# Patient Record
Sex: Male | Born: 1960 | Race: White | Hispanic: No | Marital: Married | Smoking: Never smoker
Health system: Southern US, Community
[De-identification: ages and names within clinical notes are randomized; demographics above are authoritative.]

## PROBLEM LIST (undated history)

## (undated) DIAGNOSIS — H409 Unspecified glaucoma: Secondary | ICD-10-CM

## (undated) DIAGNOSIS — M25512 Pain in left shoulder: Secondary | ICD-10-CM

## (undated) DIAGNOSIS — I10 Essential (primary) hypertension: Secondary | ICD-10-CM

## (undated) DIAGNOSIS — E785 Hyperlipidemia, unspecified: Secondary | ICD-10-CM

## (undated) DIAGNOSIS — E119 Type 2 diabetes mellitus without complications: Secondary | ICD-10-CM

## (undated) DIAGNOSIS — T8859XA Other complications of anesthesia, initial encounter: Secondary | ICD-10-CM

## (undated) HISTORY — DX: Hyperlipidemia, unspecified: E78.5

## (undated) HISTORY — DX: Unspecified glaucoma: H40.9

## (undated) HISTORY — PX: OTHER SURGICAL HISTORY: SHX169

## (undated) HISTORY — DX: Type 2 diabetes mellitus without complications: E11.9

## (undated) HISTORY — PX: SHOULDER SURGERY: SHX246

## (undated) HISTORY — DX: Essential (primary) hypertension: I10

---

## 1991-01-11 HISTORY — PX: HERNIA REPAIR: SHX51

## 2004-08-10 ENCOUNTER — Encounter: Admission: RE | Admit: 2004-08-10 | Discharge: 2004-08-10 | Payer: Self-pay | Admitting: Specialist

## 2009-01-10 HISTORY — PX: EYE SURGERY: SHX253

## 2011-07-05 ENCOUNTER — Ambulatory Visit: Payer: Self-pay | Admitting: Internal Medicine

## 2011-07-05 LAB — DOT URINE DIP
Protein: 100
Specific Gravity: 1.02 (ref 1.003–1.030)

## 2011-09-15 ENCOUNTER — Ambulatory Visit: Payer: Self-pay

## 2011-09-15 LAB — DOT URINE DIP: Specific Gravity: 1.02 (ref 1.003–1.030)

## 2011-09-26 ENCOUNTER — Ambulatory Visit: Payer: Self-pay

## 2011-12-16 ENCOUNTER — Ambulatory Visit: Payer: Self-pay | Admitting: Emergency Medicine

## 2011-12-16 LAB — DOT URINE DIP
Blood: NEGATIVE
Glucose,UR: NEGATIVE mg/dL (ref 0–75)
Specific Gravity: 1.025 (ref 1.003–1.030)

## 2012-07-04 ENCOUNTER — Ambulatory Visit: Payer: Self-pay | Admitting: Family Medicine

## 2013-12-17 ENCOUNTER — Ambulatory Visit: Payer: Self-pay | Admitting: Family Medicine

## 2013-12-17 LAB — DOT URINE DIP
BLOOD: NEGATIVE
Glucose,UR: NEGATIVE
Protein: 30
SPECIFIC GRAVITY: 1.015 (ref 1.000–1.030)

## 2018-11-26 DIAGNOSIS — H401132 Primary open-angle glaucoma, bilateral, moderate stage: Secondary | ICD-10-CM | POA: Diagnosis not present

## 2018-12-25 DIAGNOSIS — E119 Type 2 diabetes mellitus without complications: Secondary | ICD-10-CM | POA: Diagnosis not present

## 2018-12-31 ENCOUNTER — Encounter

## 2018-12-31 ENCOUNTER — Other Ambulatory Visit: Payer: Self-pay

## 2018-12-31 ENCOUNTER — Ambulatory Visit (INDEPENDENT_AMBULATORY_CARE_PROVIDER_SITE_OTHER): Payer: PPO | Admitting: Family Medicine

## 2018-12-31 ENCOUNTER — Encounter: Payer: Self-pay | Admitting: Family Medicine

## 2018-12-31 VITALS — BP 122/80 | HR 76 | Ht 77.0 in | Wt 241.0 lb

## 2018-12-31 DIAGNOSIS — E1142 Type 2 diabetes mellitus with diabetic polyneuropathy: Secondary | ICD-10-CM

## 2018-12-31 DIAGNOSIS — S43422A Sprain of left rotator cuff capsule, initial encounter: Secondary | ICD-10-CM

## 2018-12-31 DIAGNOSIS — Z7689 Persons encountering health services in other specified circumstances: Secondary | ICD-10-CM

## 2018-12-31 MED ORDER — GLIPIZIDE 5 MG PO TABS: 5.0000 mg | ORAL_TABLET | Freq: Two times a day (BID) | ORAL | 2 refills | Status: DC

## 2018-12-31 MED ORDER — MELOXICAM 15 MG PO TABS: 15.0000 mg | ORAL_TABLET | Freq: Every day | ORAL | 0 refills | Status: DC

## 2018-12-31 MED ORDER — METFORMIN HCL 500 MG PO TABS: 500.0000 mg | ORAL_TABLET | Freq: Two times a day (BID) | ORAL | 3 refills | Status: DC

## 2018-12-31 NOTE — Progress Notes (Signed)
Date:  12/31/2018   Name:  Reginald Nielsen   DOB:  13-Dec-1960   MRN:  [id number]   Chief Complaint: Establish Care, Shoulder Pain (fell off ladder over a year ago- L) shoulder feels like the Right shoulder did when he tore something), and Diabetes  Patient is a 58 year old male who presents for a establish care exam. The patient reports the following problems: diabetes/shoulder. Health maintenance has been reviewed colonoscopy.  Shoulder Pain  The pain is present in the left shoulder. This is a chronic problem. The current episode started more than 1 year ago (14 months). There has been a history of trauma. The problem occurs daily. The problem has been unchanged. The quality of the pain is described as aching. The pain is moderate. Associated symptoms include a limited range of motion. Pertinent negatives include no fever, numbness or tingling. The symptoms are aggravated by activity. He has tried NSAIDS and acetaminophen for the symptoms. The treatment provided mild relief.  Diabetes He presents for his follow-up diabetic visit. He has type 2 diabetes mellitus. His disease course has been fluctuating. There are no hypoglycemic associated symptoms. Pertinent negatives for hypoglycemia include no dizziness, headaches or nervousness/anxiousness. Associated symptoms include polydipsia, polyuria and weight loss. Pertinent negatives for diabetes include no blurred vision, no chest pain, no fatigue, no foot paresthesias, no polyphagia, no visual change and no weakness. There are no hypoglycemic complications. Symptoms are worsening. Diabetic complications include peripheral neuropathy. Pertinent negatives for diabetic complications include no autonomic neuropathy, CVA, heart disease, impotence, nephropathy, PVD or retinopathy. Current diabetic treatment includes diet. His weight is decreasing steadily. He is following a generally healthy diet. Meal planning includes avoidance of concentrated sweets and  carbohydrate counting. An ACE inhibitor/angiotensin II receptor blocker is not being taken. He does not see a podiatrist.Eye exam is current.    No results found for: CREATININE, BUN, NA, K, CL, CO2 No results found for: CHOL, HDL, LDLCALC, LDLDIRECT, TRIG, CHOLHDL No results found for: TSH No results found for: HGBA1C   Review of Systems  Constitutional: Positive for weight loss. Negative for chills, fatigue and fever.  HENT: Negative for drooling, ear discharge, ear pain and sore throat.   Eyes: Negative for blurred vision.  Respiratory: Negative for cough, shortness of breath and wheezing.   Cardiovascular: Negative for chest pain, palpitations and leg swelling.  Gastrointestinal: Negative for abdominal pain, blood in stool, constipation, diarrhea and nausea.  Endocrine: Positive for polydipsia and polyuria. Negative for polyphagia.  Genitourinary: Negative for dysuria, frequency, hematuria, impotence and urgency.  Musculoskeletal: Negative for back pain, myalgias and neck pain.  Skin: Negative for rash.  Allergic/Immunologic: Negative for environmental allergies.  Neurological: Negative for dizziness, tingling, weakness, numbness and headaches.  Hematological: Does not bruise/bleed easily.  Psychiatric/Behavioral: Negative for suicidal ideas. The patient is not nervous/anxious.     There are no problems to display for this patient.   No Known Allergies    Social History   Tobacco Use  . Smoking status: Never Smoker  . Smokeless tobacco: Current User    Types: Chew  Substance Use Topics  . Alcohol use: Not Currently  . Drug use: Not Currently     Medication list has been reviewed and updated.  Current Meds  Medication Sig  . acetaminophen (TYLENOL) 325 MG tablet Take 2 tablets by mouth every 6 (six) hours as needed.  . COMBIGAN 0.2-0.5 % ophthalmic solution Apply 1 drop to eye 2 (two) times daily.  .Marland Kitchen  Ibuprofen 200 MG CAPS Take 3 capsules by mouth daily.  Marland Kitchen  latanoprost (XALATAN) 0.005 % ophthalmic solution     PHQ 2/9 Scores 12/31/2018  PHQ - 2 Score 0  PHQ- 9 Score 0    BP Readings from Last 3 Encounters:  12/31/18 122/80    Physical Exam Vitals and nursing note reviewed.  HENT:     Head: Normocephalic.     Right Ear: Tympanic membrane, ear canal and external ear normal.     Left Ear: Tympanic membrane, ear canal and external ear normal.     Nose: Nose normal. No congestion or rhinorrhea.     Mouth/Throat:     Mouth: Mucous membranes are moist.  Eyes:     General: No scleral icterus.       Right eye: No discharge.        Left eye: No discharge.     Conjunctiva/sclera: Conjunctivae normal.     Pupils: Pupils are equal, round, and reactive to light.  Neck:     Thyroid: No thyromegaly.     Vascular: No JVD.     Trachea: No tracheal deviation.  Cardiovascular:     Rate and Rhythm: Normal rate and regular rhythm.     Heart sounds: Normal heart sounds, S1 normal and S2 normal. No murmur. No systolic murmur. No diastolic murmur. No friction rub. No gallop. No S3 or S4 sounds.   Pulmonary:     Effort: No respiratory distress.     Breath sounds: Normal breath sounds. No wheezing, rhonchi or rales.  Abdominal:     General: Bowel sounds are normal.     Palpations: Abdomen is soft. There is no mass.     Tenderness: There is no abdominal tenderness. There is no guarding or rebound.  Musculoskeletal:        General: No tenderness.     Left shoulder: No bony tenderness. Decreased range of motion. Normal pulse.     Cervical back: Normal range of motion and neck supple.     Right lower leg: No edema.     Left lower leg: No edema.  Lymphadenopathy:     Cervical: No cervical adenopathy.  Skin:    General: Skin is warm.     Findings: No rash.  Neurological:     Mental Status: He is alert and oriented to person, place, and time.     Cranial Nerves: No cranial nerve deficit.     Deep Tendon Reflexes: Reflexes are normal and symmetric.       Wt Readings from Last 3 Encounters:  12/31/18 241 lb (109.3 kg)    BP 122/80   Pulse 76   Ht 6\' 5"  (1.956 m)   Wt 241 lb (109.3 kg)   BMI 28.58 kg/m   Assessment and Plan:   1. Establishing care with new doctor, encounter for Patient establishes care with new physician.  Patient's previous encounters/elsewhere reviewed.  There are no labs to review nor imaging to review.  We will recheck patient in approximately 6 weeks with physical exam.  2. Type 2 diabetes mellitus with diabetic polyneuropathy, without long-term current use of insulin (HCC) Chronic.  Uncontrolled.  Point-of-care glucose 273 today.  We will resume patient's glipizide at 5 mg twice a day and Metformin 500 twice a day.  Will recheck patient in 6 weeks at which time we will do A1c lipid panel and microalbuminuria.  Patient was also given diet on monitoring of carbohydrates. - glipiZIDE (GLUCOTROL) 5 MG  tablet; Take 1 tablet (5 mg total) by mouth 2 (two) times daily before a meal.  Dispense: 60 tablet; Refill: 2 - metFORMIN (GLUCOPHAGE) 500 MG tablet; Take 1 tablet (500 mg total) by mouth 2 (two) times daily with a meal.  Dispense: 60 tablet; Refill: 3  3. Sprain of left rotator cuff capsule, initial encounter Chronic.  But had an accident where he fell off a ladder about 2 years.  Patient had right shoulder operated on by Dr. Mack Guise.  This is left arm shoulder that is involved at this time.  Patient is amenable to seeing Tawanna Cooler Monday since Dr. Mack Guise is not available and we will refer to him for evaluation and treatment.  In the meantime we will initiate meloxicam 15 mg once a day. - meloxicam (MOBIC) 15 MG tablet; Take 1 tablet (15 mg total) by mouth daily.  Dispense: 30 tablet; Refill: 0 - Ambulatory referral to Orthopedic Surgery

## 2018-12-31 NOTE — Patient Instructions (Signed)
Carbohydrate Counting for Diabetes Mellitus, Adult  Carbohydrate counting is a method of keeping track of how many carbohydrates you eat. Eating carbohydrates naturally increases the amount of sugar (glucose) in the blood. Counting how many carbohydrates you eat helps keep your blood glucose within normal limits, which helps you manage your diabetes (diabetes mellitus). It is important to know how many carbohydrates you can safely have in each meal. This is different for every person. A diet and nutrition specialist (registered dietitian) can help you make a meal plan and calculate how many carbohydrates you should have at each meal and snack. Carbohydrates are found in the following foods:  Grains, such as breads and cereals.  Dried beans and soy products.  Starchy vegetables, such as potatoes, peas, and corn.  Fruit and fruit juices.  Milk and yogurt.  Sweets and snack foods, such as cake, cookies, candy, chips, and soft drinks. How do I count carbohydrates? There are two ways to count carbohydrates in food. You can use either of the methods or a combination of both. Reading "Nutrition Facts" on packaged food The "Nutrition Facts" list is included on the labels of almost all packaged foods and beverages in the [place] It includes:  The serving size.  Information about nutrients in each serving, including the grams (g) of carbohydrate per serving. To use the "Nutrition Facts":  Decide how many servings you will have.  Multiply the number of servings by the number of carbohydrates per serving.  The resulting number is the total amount of carbohydrates that you will be having. Learning standard serving sizes of other foods When you eat carbohydrate foods that are not packaged or do not include "Nutrition Facts" on the label, you need to measure the servings in order to count the amount of carbohydrates:  Measure the foods that you will eat with a food scale or measuring cup, if needed.   Decide how many standard-size servings you will eat.  Multiply the number of servings by 15. Most carbohydrate-rich foods have about 15 g of carbohydrates per serving. ? For example, if you eat 8 oz (170 g) of strawberries, you will have eaten 2 servings and 30 g of carbohydrates (2 servings x 15 g = 30 g).  For foods that have more than one food mixed, such as soups and casseroles, you must count the carbohydrates in each food that is included. The following list contains standard serving sizes of common carbohydrate-rich foods. Each of these servings has about 15 g of carbohydrates:   hamburger bun or  English muffin.   oz (15 mL) syrup.   oz (14 g) jelly.  1 slice of bread.  1 six-inch tortilla.  3 oz (85 g) cooked rice or pasta.  4 oz (113 g) cooked dried beans.  4 oz (113 g) starchy vegetable, such as peas, corn, or potatoes.  4 oz (113 g) hot cereal.  4 oz (113 g) mashed potatoes or  of a large baked potato.  4 oz (113 g) canned or frozen fruit.  4 oz (120 mL) fruit juice.  4-6 crackers.  6 chicken nuggets.  6 oz (170 g) unsweetened dry cereal.  6 oz (170 g) plain fat-free yogurt or yogurt sweetened with artificial sweeteners.  8 oz (240 mL) milk.  8 oz (170 g) fresh fruit or one small piece of fruit.  24 oz (680 g) popped popcorn. Example of carbohydrate counting Sample meal  3 oz (85 g) chicken breast.  6 oz (170 g)   brown rice.  4 oz (113 g) corn.  8 oz (240 mL) milk.  8 oz (170 g) strawberries with sugar-free whipped topping. Carbohydrate calculation 1. Identify the foods that contain carbohydrates: ? Rice. ? Corn. ? Milk. ? Strawberries. 2. Calculate how many servings you have of each food: ? 2 servings rice. ? 1 serving corn. ? 1 serving milk. ? 1 serving strawberries. 3. Multiply each number of servings by 15 g: ? 2 servings rice x 15 g = 30 g. ? 1 serving corn x 15 g = 15 g. ? 1 serving milk x 15 g = 15 g. ? 1 serving  strawberries x 15 g = 15 g. 4. Add together all of the amounts to find the total grams of carbohydrates eaten: ? 30 g + 15 g + 15 g + 15 g = 75 g of carbohydrates total. Summary  Carbohydrate counting is a method of keeping track of how many carbohydrates you eat.  Eating carbohydrates naturally increases the amount of sugar (glucose) in the blood.  Counting how many carbohydrates you eat helps keep your blood glucose within normal limits, which helps you manage your diabetes.  A diet and nutrition specialist (registered dietitian) can help you make a meal plan and calculate how many carbohydrates you should have at each meal and snack. This information is not intended to replace advice given to you by your health care provider. Make sure you discuss any questions you have with your health care provider. Document Released: 12/27/2004 Document Revised: 07/21/2016 Document Reviewed: 06/10/2015 Elsevier Patient Education  2020 [employer]  

## 2019-01-29 ENCOUNTER — Other Ambulatory Visit: Payer: Self-pay | Admitting: Family Medicine

## 2019-01-29 DIAGNOSIS — S43422A Sprain of left rotator cuff capsule, initial encounter: Secondary | ICD-10-CM

## 2019-02-13 ENCOUNTER — Other Ambulatory Visit: Payer: Self-pay

## 2019-02-13 ENCOUNTER — Encounter: Payer: Self-pay | Admitting: Family Medicine

## 2019-02-13 ENCOUNTER — Ambulatory Visit (INDEPENDENT_AMBULATORY_CARE_PROVIDER_SITE_OTHER): Payer: PPO | Admitting: Family Medicine

## 2019-02-13 VITALS — BP 130/80 | HR 68 | Ht 77.0 in | Wt 239.0 lb

## 2019-02-13 DIAGNOSIS — Z0000 Encounter for general adult medical examination without abnormal findings: Secondary | ICD-10-CM

## 2019-02-13 DIAGNOSIS — R234 Changes in skin texture: Secondary | ICD-10-CM

## 2019-02-13 DIAGNOSIS — R194 Change in bowel habit: Secondary | ICD-10-CM

## 2019-02-13 DIAGNOSIS — R351 Nocturia: Secondary | ICD-10-CM

## 2019-02-13 DIAGNOSIS — Z23 Encounter for immunization: Secondary | ICD-10-CM | POA: Diagnosis not present

## 2019-02-13 DIAGNOSIS — E1142 Type 2 diabetes mellitus with diabetic polyneuropathy: Secondary | ICD-10-CM

## 2019-02-13 NOTE — Progress Notes (Signed)
Date:  02/13/2019   Name:  Reginald Nielsen   DOB:  13-Jun-1960   MRN:  [id number]   Chief Complaint: Annual Exam, Diabetes, and Flu Vaccine  Patient is a 59 year old male who presents for a comprehensive physical exam. The patient reports the following problems: diabetes. Health maintenance has been reviewed colonoscopy  Diabetes He presents for his follow-up diabetic visit. He has type 2 diabetes mellitus. His disease course has been stable. There are no hypoglycemic associated symptoms. Pertinent negatives for hypoglycemia include no dizziness, headaches or nervousness/anxiousness. There are no diabetic associated symptoms. Pertinent negatives for diabetes include no blurred vision, no chest pain, no fatigue, no foot paresthesias, no foot ulcerations, no polydipsia, no polyphagia, no polyuria, no visual change, no weakness and no weight loss. There are no hypoglycemic complications. Symptoms are stable. There are no known risk factors for coronary artery disease. Current diabetic treatment includes oral agent (dual therapy). He is compliant with treatment most of the time. His weight is stable. He is following a generally healthy diet. Meal planning includes avoidance of concentrated sweets and carbohydrate counting. He participates in exercise daily. His home blood glucose trend is fluctuating minimally. His breakfast blood glucose is taken between 8-9 am. His breakfast blood glucose range is generally 110-130 mg/dl. An ACE inhibitor/angiotensin II receptor blocker is not being taken. He does not see a podiatrist.Eye exam is not current.    No results found for: CREATININE, BUN, NA, K, CL, CO2 No results found for: CHOL, HDL, LDLCALC, LDLDIRECT, TRIG, CHOLHDL No results found for: TSH No results found for: HGBA1C   Review of Systems  Constitutional: Negative for chills, fatigue, fever and weight loss.  HENT: Negative for drooling, ear discharge, ear pain and sore throat.   Eyes: Negative  for blurred vision.  Respiratory: Negative for cough, shortness of breath and wheezing.   Cardiovascular: Negative for chest pain, palpitations and leg swelling.  Gastrointestinal: Negative for abdominal pain, blood in stool, constipation, diarrhea and nausea.  Endocrine: Negative for polydipsia, polyphagia and polyuria.  Genitourinary: Positive for difficulty urinating and urgency. Negative for dysuria, frequency and hematuria.       Nocturia/hesitancy  Musculoskeletal: Negative for back pain, myalgias and neck pain.  Skin: Negative for rash.  Allergic/Immunologic: Negative for environmental allergies.  Neurological: Negative for dizziness, weakness and headaches.  Hematological: Does not bruise/bleed easily.  Psychiatric/Behavioral: Negative for suicidal ideas. The patient is not nervous/anxious.     There are no problems to display for this patient.   No Known Allergies  Past Surgical History:  Procedure Laterality Date  . frozen shoulder    . SHOULDER SURGERY     2015 and 2016    Social History   Tobacco Use  . Smoking status: Never Smoker  . Smokeless tobacco: Current User    Types: Chew  Substance Use Topics  . Alcohol use: Not Currently  . Drug use: Not Currently     Medication list has been reviewed and updated.  Current Meds  Medication Sig  . aspirin EC 81 MG tablet Take 81 mg by mouth daily.  . COMBIGAN 0.2-0.5 % ophthalmic solution Apply 1 drop to eye 2 (two) times daily.  Marland Kitchen glipiZIDE (GLUCOTROL) 5 MG tablet Take 1 tablet (5 mg total) by mouth 2 (two) times daily before a meal.  . latanoprost (XALATAN) 0.005 % ophthalmic solution   . meloxicam (MOBIC) 15 MG tablet TAKE ONE TABLET BY MOUTH ONCE DAILY   .  metFORMIN (GLUCOPHAGE) 500 MG tablet Take 1 tablet (500 mg total) by mouth 2 (two) times daily with a meal.  . [DISCONTINUED] acetaminophen (TYLENOL) 325 MG tablet Take 2 tablets by mouth every 6 (six) hours as needed.    PHQ 2/9 Scores 02/13/2019  12/31/2018  PHQ - 2 Score 0 0  PHQ- 9 Score 0 0    BP Readings from Last 3 Encounters:  02/13/19 130/80  12/31/18 122/80    Physical Exam Vitals and nursing note reviewed.  Constitutional:      Appearance: Normal appearance. He is well-groomed and overweight.  HENT:     Head: Normocephalic.     Jaw: There is normal jaw occlusion.     Right Ear: Hearing, tympanic membrane, ear canal and external ear normal.     Left Ear: Hearing, tympanic membrane, ear canal and external ear normal.     Nose: Nose normal.     Mouth/Throat:     Lips: Pink.     Mouth: Mucous membranes are moist. Mucous membranes are pale.     Tongue: No lesions.     Palate: No mass.     Pharynx: Oropharynx is clear. Uvula midline.  Eyes:     General: Lids are normal. Vision grossly intact. Gaze aligned appropriately. No scleral icterus.       Right eye: No discharge.        Left eye: No discharge.     Extraocular Movements: Extraocular movements intact.     Conjunctiva/sclera: Conjunctivae normal.     Pupils: Pupils are equal, round, and reactive to light.     Funduscopic exam:    Right eye: Red reflex present.        Left eye: Red reflex present. Neck:     Thyroid: No thyromegaly.     Vascular: Normal carotid pulses. No carotid bruit, hepatojugular reflux or JVD.     Trachea: Trachea and phonation normal. No tracheal deviation.  Cardiovascular:     Rate and Rhythm: Normal rate and regular rhythm.     Chest Wall: PMI is not displaced.     Pulses: Normal pulses.          Carotid pulses are 2+ on the right side and 2+ on the left side.      Radial pulses are 2+ on the right side and 2+ on the left side.       Femoral pulses are 2+ on the right side and 2+ on the left side.      Popliteal pulses are 2+ on the right side and 2+ on the left side.       Dorsalis pedis pulses are 2+ on the right side and 2+ on the left side.       Posterior tibial pulses are 2+ on the right side and 2+ on the left side.      Heart sounds: Normal heart sounds, S1 normal and S2 normal. No murmur. No systolic murmur. No diastolic murmur. No friction rub. No gallop. No S3 or S4 sounds.   Pulmonary:     Effort: No respiratory distress.     Breath sounds: Normal breath sounds. No decreased breath sounds, wheezing, rhonchi or rales.  Chest:     Breasts: Breasts are symmetrical.        Right: Normal.        Left: Normal.  Abdominal:     General: Bowel sounds are normal.     Palpations: Abdomen is soft. There is no hepatomegaly,  splenomegaly or mass.     Tenderness: There is no abdominal tenderness. There is no right CVA tenderness, left CVA tenderness, guarding or rebound.     Hernia: A hernia is present. Hernia is present in the ventral area. There is no hernia in the left inguinal area or right inguinal area.  Genitourinary:    Penis: Normal.      Testes: Normal.        Right: Mass not present.        Left: Mass not present.     Epididymis:     Right: Normal.     Left: Normal.     Prostate: Normal.     Rectum: Normal. Guaiac result negative. No mass.  Musculoskeletal:        General: No tenderness. Normal range of motion.     Cervical back: Normal, full passive range of motion without pain, normal range of motion and neck supple.     Thoracic back: Normal.     Lumbar back: Normal.     Right lower leg: No edema.     Left lower leg: No edema.  Feet:     Right foot:     Skin integrity: Callus present.     Left foot:     Skin integrity: Callus and fissure present.  Lymphadenopathy:     Head:     Right side of head: No submandibular adenopathy.     Left side of head: No submandibular adenopathy.     Cervical: No cervical adenopathy.     Right cervical: No superficial, deep or posterior cervical adenopathy.    Left cervical: No superficial, deep or posterior cervical adenopathy.     Upper Body:     Right upper body: No supraclavicular adenopathy.     Left upper body: No supraclavicular adenopathy.      Lower Body: No right inguinal adenopathy. No left inguinal adenopathy.  Skin:    General: Skin is warm.     Capillary Refill: Capillary refill takes less than 2 seconds.     Findings: No rash.  Neurological:     Mental Status: He is alert and oriented to person, place, and time.     Cranial Nerves: Cranial nerves are intact. No cranial nerve deficit.     Sensory: Sensation is intact. No sensory deficit.     Motor: Motor function is intact.     Deep Tendon Reflexes: Reflexes are normal and symmetric.     Reflex Scores:      Tricep reflexes are 2+ on the right side and 2+ on the left side.      Bicep reflexes are 2+ on the right side and 2+ on the left side.      Brachioradialis reflexes are 2+ on the right side and 2+ on the left side.      Patellar reflexes are 2+ on the right side and 2+ on the left side.      Achilles reflexes are 2+ on the right side and 2+ on the left side.    Wt Readings from Last 3 Encounters:  02/13/19 239 lb (108.4 kg)  12/31/18 241 lb (109.3 kg)    BP 130/80   Pulse 68   Ht 6\' 5"  (1.956 m)   Wt 239 lb (108.4 kg)   BMI 28.34 kg/m   Assessment and Plan:  1. Annual physical exam No subjective/objective concerns noted during history and physical exam.  Patient's previous encounters were reviewed as well as most  recent labs, imaging, and care elsewhere.Reginald Nielsen is a 59 y.o. male who presents today for his Complete Annual Exam. He feels well. He reports exercising . He reports he is sleeping well.  Immunizations are reviewed and recommendations provided.   Age appropriate screening tests are discussed. Counseling given for risk factor reduction interventions. 2. Type 2 diabetes mellitus with diabetic polyneuropathy, without long-term current use of insulin (HCC) Chronic.  Controlled.  Stable.  Medications were reviewed.  We will obtain an A1c and microalbuminuria as well as a lipid panel and renal function panel for GFR. - HgB A1c - Microalbumin,  urine - Lipid Panel With LDL/HDL Ratio - Renal Function Panel  3. Nocturia Patient with a history of nocturia and occasional hesitancy.  Will obtain a PSA for evaluation of prostate.  DRE was performed today and it was upper limits of normal in size with normal consistency and no nodularity. - PSA  4. Change in bowel habit Patient with a recent change in bowel habits.  Will refer to GI for evaluation as well as possible colonoscopy for colon cancer screening. - Ambulatory referral to Gastroenterology  5. Fissure in skin of foot Patient was noted to have a fissure in the skin of the left foot.  With history of diabetes patient is referred to podiatry for evaluation and treatment of this area and for consideration of proper nail care in the future. - Ambulatory referral to Podiatry  6. Influenza vaccine needed Discussed and administered - Flu Vaccine QUAD 6+ mos PF IM (Fluarix Quad PF)

## 2019-02-14 LAB — RENAL FUNCTION PANEL
Albumin: 4.5 g/dL (ref 3.8–4.9)
BUN/Creatinine Ratio: 12 (ref 9–20)
BUN: 15 mg/dL (ref 6–24)
CO2: 23 mmol/L (ref 20–29)
Calcium: 9.9 mg/dL (ref 8.7–10.2)
Chloride: 102 mmol/L (ref 96–106)
Creatinine, Ser: 1.23 mg/dL (ref 0.76–1.27)
GFR calc Af Amer: 75 mL/min/{1.73_m2} (ref >59)
GFR calc non Af Amer: 65 mL/min/{1.73_m2} (ref >59)
Glucose: 125 mg/dL — ABNORMAL HIGH (ref 65–99)
Phosphorus: 3.8 mg/dL (ref 2.8–4.1)
Potassium: 5.2 mmol/L (ref 3.5–5.2)
Sodium: 140 mmol/L (ref 134–144)

## 2019-02-14 LAB — LIPID PANEL WITH LDL/HDL RATIO
Cholesterol, Total: 194 mg/dL (ref 100–199)
HDL: 42 mg/dL (ref >39)
LDL Chol Calc (NIH): 125 mg/dL — ABNORMAL HIGH (ref 0–99)
LDL/HDL Ratio: 3 ratio (ref 0.0–3.6)
Triglycerides: 153 mg/dL — ABNORMAL HIGH (ref 0–149)
VLDL Cholesterol Cal: 27 mg/dL (ref 5–40)

## 2019-02-14 LAB — HEMOGLOBIN A1C
Est. average glucose Bld gHb Est-mCnc: 194 mg/dL
Hgb A1c MFr Bld: 8.4 % — ABNORMAL HIGH (ref 4.8–5.6)

## 2019-02-14 LAB — PSA: Prostate Specific Ag, Serum: 2.8 ng/mL (ref 0.0–4.0)

## 2019-02-14 LAB — MICROALBUMIN, URINE: Microalbumin, Urine: 106.7 ug/mL

## 2019-02-18 ENCOUNTER — Other Ambulatory Visit: Payer: Self-pay

## 2019-02-18 DIAGNOSIS — E1142 Type 2 diabetes mellitus with diabetic polyneuropathy: Secondary | ICD-10-CM

## 2019-02-18 MED ORDER — METFORMIN HCL 500 MG PO TABS: 500.0000 mg | ORAL_TABLET | Freq: Two times a day (BID) | ORAL | 0 refills | Status: DC

## 2019-02-18 MED ORDER — GLIPIZIDE 5 MG PO TABS: 5.0000 mg | ORAL_TABLET | Freq: Two times a day (BID) | ORAL | 0 refills | Status: DC

## 2019-02-18 NOTE — Progress Notes (Unsigned)
Called with pt wants to stay on glipizide 5mg  bid

## 2019-02-27 ENCOUNTER — Other Ambulatory Visit: Payer: Self-pay | Admitting: Family Medicine

## 2019-02-27 DIAGNOSIS — S43422A Sprain of left rotator cuff capsule, initial encounter: Secondary | ICD-10-CM

## 2019-03-08 ENCOUNTER — Other Ambulatory Visit: Payer: Self-pay | Admitting: Family Medicine

## 2019-03-25 ENCOUNTER — Other Ambulatory Visit: Payer: Self-pay

## 2019-03-25 ENCOUNTER — Encounter: Payer: Self-pay | Admitting: Gastroenterology

## 2019-03-25 ENCOUNTER — Ambulatory Visit (INDEPENDENT_AMBULATORY_CARE_PROVIDER_SITE_OTHER): Payer: PPO | Admitting: Gastroenterology

## 2019-03-25 ENCOUNTER — Encounter (INDEPENDENT_AMBULATORY_CARE_PROVIDER_SITE_OTHER): Payer: Self-pay

## 2019-03-25 VITALS — BP 142/91 | HR 74 | Temp 97.8°F | Ht 77.0 in | Wt 234.2 lb

## 2019-03-25 DIAGNOSIS — R194 Change in bowel habit: Secondary | ICD-10-CM

## 2019-03-25 DIAGNOSIS — Z1211 Encounter for screening for malignant neoplasm of colon: Secondary | ICD-10-CM

## 2019-03-25 NOTE — Progress Notes (Signed)
Gastroenterology Consultation  Referring Provider:     Duanne Limerick, MD Primary Care Physician:  Duanne Limerick, MD Primary Gastroenterologist:  Dr. Servando Snare     Reason for Consultation:     Change in bowel habits        HPI:   Reginald Nielsen is a 59 y.o. y/o male referred for consultation & management of change in bowel habits by Dr. Yetta Barre, Vanita Panda, MD.  This patient was seen by his PCP, Dr. Yetta Barre, for evaluation and the patient had reported that he was having a change in bowel habits.  The patient does not appear to have had a colonoscopy in the past. The patient says he was constipated after starting some meds for diabetes. He started taking Miralax and he did not notice any difference. He stopped it and the bowels are back to your baseline. He thinks it may also be his diet. The patient denies any unexplained weight loss fevers chills nausea vomiting black stools or bloody stools.  He also denies any family history of colon cancer or colon polyps.  Past Medical History:  Diagnosis Date  . Diabetes mellitus without complication (HCC)    type 2  . Glaucoma   . Hyperlipidemia   . Hypertension     Past Surgical History:  Procedure Laterality Date  . frozen shoulder    . SHOULDER SURGERY     2015 and 2016    Prior to Admission medications   Medication Sig Start Date End Date Taking? Authorizing Provider  aspirin EC 81 MG tablet Take 81 mg by mouth daily.    [provider]  COMBIGAN 0.2-0.5 % ophthalmic solution Apply 1 drop to eye 2 (two) times daily. 11/26/18   [provider]  glipiZIDE (GLUCOTROL) 5 MG tablet Take 1 tablet (5 mg total) by mouth 2 (two) times daily before a meal. 02/18/19   Duanne Limerick, MD  latanoprost (XALATAN) 0.005 % ophthalmic solution  12/26/18   [provider]  meloxicam (MOBIC) 15 MG tablet TAKE ONE TABLET BY MOUTH ONCE DAILY, NEEDS FOLLOW UP APPOINTMENT FOR FURTHER REFILLS. 02/27/19   Duanne Limerick, MD  metFORMIN  (GLUCOPHAGE) 500 MG tablet Take 1 tablet (500 mg total) by mouth 2 (two) times daily with a meal. 02/18/19   Duanne Limerick, MD  OneTouch Delica Lancets 30G MISC USE DAILY 03/08/19   Duanne Limerick, MD  [facility] ULTRA test strip USE TO TEST BLOOD SUGAR ONCE DAILY 03/08/19   Duanne Limerick, MD    No family history on file.   Social History   Tobacco Use  . Smoking status: Never Smoker  . Smokeless tobacco: Current User    Types: Chew  Substance Use Topics  . Alcohol use: Not Currently  . Drug use: Not Currently    Allergies as of 03/25/2019  . (No Known Allergies)    Review of Systems:    All systems reviewed and negative except where noted in HPI.   Physical Exam:  There were no vitals taken for this visit. No LMP for male patient. General:   Alert,  Well-developed, well-nourished, pleasant and cooperative in NAD Head:  Normocephalic and atraumatic. Eyes:  Sclera clear, no icterus.   Conjunctiva pink. Ears:  Normal auditory acuity. Neck:  Supple; no masses or thyromegaly. Lungs:  Respirations even and unlabored.  Clear throughout to auscultation.   No wheezes, crackles, or rhonchi. No acute distress. Heart:  Regular rate and rhythm;  no murmurs, clicks, rubs, or gallops. Abdomen:  Normal bowel sounds.  No bruits.  Soft, non-tender and non-distended without masses, hepatosplenomegaly or hernias noted.  No guarding or rebound tenderness.  Negative Carnett sign.   Rectal:  Deferred.  Pulses:  Normal pulses noted. Extremities:  No clubbing or edema.  No cyanosis. Neurologic:  Alert and oriented x3;  grossly normal neurologically. Skin:  Intact without significant lesions or rashes.  No jaundice. Lymph Nodes:  No significant cervical adenopathy. Psych:  Alert and cooperative. Normal mood and affect.  Imaging Studies: No results found.  Assessment and Plan:   Reginald Nielsen is a 58 y.o. y/o male who comes in with a change in bowel habits that have now turned back to  normal.  He attributes the change in bowel habits to his diet change and weight loss and also to the change in his medications.  The patient is now back to his baseline.  The patient will be set up for screening colonoscopy since he has never had a colonoscopy in the past.  The patient has been explained the plan and agrees with it.    Odilia Damico, MD. FACG    Note: This dictation was prepared with Dragon dictation along with smaller phrase technology. Any transcriptional errors that result from this process are unintentional.   

## 2019-03-25 NOTE — H&P (View-Only) (Signed)
Gastroenterology Consultation  Referring Provider:     Duanne Limerick, MD Primary Care Physician:  Reginald Limerick, MD Primary Gastroenterologist:  Dr. Servando Snare     Reason for Consultation:     Change in bowel habits        HPI:   Reginald Nielsen is a 59 y.o. y/o male referred for consultation & management of change in bowel habits by Dr. Yetta Barre, Vanita Panda, MD.  This patient was seen by his PCP, Dr. Yetta Barre, for evaluation and the patient had reported that he was having a change in bowel habits.  The patient does not appear to have had a colonoscopy in the past. The patient says he was constipated after starting some meds for diabetes. He started taking Miralax and he did not notice any difference. He stopped it and the bowels are back to your baseline. He thinks it may also be his diet. The patient denies any unexplained weight loss fevers chills nausea vomiting black stools or bloody stools.  He also denies any family history of colon cancer or colon polyps.  Past Medical History:  Diagnosis Date  . Diabetes mellitus without complication (HCC)    type 2  . Glaucoma   . Hyperlipidemia   . Hypertension     Past Surgical History:  Procedure Laterality Date  . frozen shoulder    . SHOULDER SURGERY     2015 and 2016    Prior to Admission medications   Medication Sig Start Date End Date Taking? Authorizing Provider  aspirin EC 81 MG tablet Take 81 mg by mouth daily.    [provider]  COMBIGAN 0.2-0.5 % ophthalmic solution Apply 1 drop to eye 2 (two) times daily. 11/26/18   [provider]  glipiZIDE (GLUCOTROL) 5 MG tablet Take 1 tablet (5 mg total) by mouth 2 (two) times daily before a meal. 02/18/19   Reginald Limerick, MD  latanoprost (XALATAN) 0.005 % ophthalmic solution  12/26/18   [provider]  meloxicam (MOBIC) 15 MG tablet TAKE ONE TABLET BY MOUTH ONCE DAILY, NEEDS FOLLOW UP APPOINTMENT FOR FURTHER REFILLS. 02/27/19   Reginald Limerick, MD  metFORMIN  (GLUCOPHAGE) 500 MG tablet Take 1 tablet (500 mg total) by mouth 2 (two) times daily with a meal. 02/18/19   Reginald Limerick, MD  OneTouch Delica Lancets 30G MISC USE DAILY 03/08/19   Reginald Limerick, MD  [facility] ULTRA test strip USE TO TEST BLOOD SUGAR ONCE DAILY 03/08/19   Reginald Limerick, MD    No family history on file.   Social History   Tobacco Use  . Smoking status: Never Smoker  . Smokeless tobacco: Current User    Types: Chew  Substance Use Topics  . Alcohol use: Not Currently  . Drug use: Not Currently    Allergies as of 03/25/2019  . (No Known Allergies)    Review of Systems:    All systems reviewed and negative except where noted in HPI.   Physical Exam:  There were no vitals taken for this visit. No LMP for male patient. General:   Alert,  Well-developed, well-nourished, pleasant and cooperative in NAD Head:  Normocephalic and atraumatic. Eyes:  Sclera clear, no icterus.   Conjunctiva pink. Ears:  Normal auditory acuity. Neck:  Supple; no masses or thyromegaly. Lungs:  Respirations even and unlabored.  Clear throughout to auscultation.   No wheezes, crackles, or rhonchi. No acute distress. Heart:  Regular rate and rhythm;  no murmurs, clicks, rubs, or gallops. Abdomen:  Normal bowel sounds.  No bruits.  Soft, non-tender and non-distended without masses, hepatosplenomegaly or hernias noted.  No guarding or rebound tenderness.  Negative Carnett sign.   Rectal:  Deferred.  Pulses:  Normal pulses noted. Extremities:  No clubbing or edema.  No cyanosis. Neurologic:  Alert and oriented x3;  grossly normal neurologically. Skin:  Intact without significant lesions or rashes.  No jaundice. Lymph Nodes:  No significant cervical adenopathy. Psych:  Alert and cooperative. Normal mood and affect.  Imaging Studies: No results found.  Assessment and Plan:   Reginald Nielsen is a 59 y.o. y/o male who comes in with a change in bowel habits that have now turned back to  normal.  He attributes the change in bowel habits to his diet change and weight loss and also to the change in his medications.  The patient is now back to his baseline.  The patient will be set up for screening colonoscopy since he has never had a colonoscopy in the past.  The patient has been explained the plan and agrees with it.    Reginald Lame, MD. Reginald Nielsen    Note: This dictation was prepared with Dragon dictation along with smaller phrase technology. Any transcriptional errors that result from this process are unintentional.

## 2019-03-27 ENCOUNTER — Other Ambulatory Visit: Payer: Self-pay | Admitting: Family Medicine

## 2019-03-27 DIAGNOSIS — S43422A Sprain of left rotator cuff capsule, initial encounter: Secondary | ICD-10-CM

## 2019-04-01 ENCOUNTER — Other Ambulatory Visit: Payer: Self-pay

## 2019-04-01 ENCOUNTER — Encounter: Payer: Self-pay | Admitting: Gastroenterology

## 2019-04-01 MED ORDER — SUTAB 1479-225-188 MG PO TABS: 1.0000 | ORAL_TABLET | ORAL | 0 refills | Status: DC

## 2019-04-04 ENCOUNTER — Other Ambulatory Visit
Admission: RE | Admit: 2019-04-04 | Discharge: 2019-04-04 | Disposition: A | Discharge status: Home / Self Care | Payer: PPO | Source: Ambulatory Visit | Attending: Gastroenterology | Admitting: Gastroenterology

## 2019-04-04 DIAGNOSIS — Z01812 Encounter for preprocedural laboratory examination: Secondary | ICD-10-CM | POA: Insufficient documentation

## 2019-04-04 DIAGNOSIS — Z20822 Contact with and (suspected) exposure to covid-19: Secondary | ICD-10-CM | POA: Diagnosis not present

## 2019-04-04 LAB — SARS CORONAVIRUS 2 (TAT 6-24 HRS): SARS Coronavirus 2: NEGATIVE

## 2019-04-04 NOTE — Discharge Instructions (Signed)
General Anesthesia, Adult, Care After This sheet gives you information about how to care for yourself after your procedure. Your health care provider may also give you more specific instructions. If you have problems or questions, contact your health care provider. What can I expect after the procedure? After the procedure, the following side effects are common:  Pain or discomfort at the IV site.  Nausea.  Vomiting.  Sore throat.  Trouble concentrating.  Feeling cold or chills.  Weak or tired.  Sleepiness and fatigue.  Soreness and body aches. These side effects can affect parts of the body that were not involved in surgery. Follow these instructions at home:  For at least 24 hours after the procedure:  Have a responsible adult stay with you. It is important to have someone help care for you until you are awake and alert.  Rest as needed.  Do not: ? Participate in activities in which you could fall or become injured. ? Drive. ? Use heavy machinery. ? Drink alcohol. ? Take sleeping pills or medicines that cause drowsiness. ? Make important decisions or sign legal documents. ? Take care of children on your own. Eating and drinking  Follow any instructions from your health care provider about eating or drinking restrictions.  When you feel hungry, start by eating small amounts of foods that are soft and easy to digest (bland), such as toast. Gradually return to your regular diet.  Drink enough fluid to keep your urine pale yellow.  If you vomit, rehydrate by drinking water, juice, or clear broth. General instructions  If you have sleep apnea, surgery and certain medicines can increase your risk for breathing problems. Follow instructions from your health care provider about wearing your sleep device: ? Anytime you are sleeping, including during daytime naps. ? While taking prescription pain medicines, sleeping medicines, or medicines that make you drowsy.  Return to  your normal activities as told by your health care provider. Ask your health care provider what activities are safe for you.  Take over-the-counter and prescription medicines only as told by your health care provider.  If you smoke, do not smoke without supervision.  Keep all follow-up visits as told by your health care provider. This is important. Contact a health care provider if:  You have nausea or vomiting that does not get better with medicine.  You cannot eat or drink without vomiting.  You have pain that does not get better with medicine.  You are unable to pass urine.  You develop a skin rash.  You have a fever.  You have redness around your IV site that gets worse. Get help right away if:  You have difficulty breathing.  You have chest pain.  You have blood in your urine or stool, or you vomit blood. Summary  After the procedure, it is common to have a sore throat or nausea. It is also common to feel tired.  Have a responsible adult stay with you for the first 24 hours after general anesthesia. It is important to have someone help care for you until you are awake and alert.  When you feel hungry, start by eating small amounts of foods that are soft and easy to digest (bland), such as toast. Gradually return to your regular diet.  Drink enough fluid to keep your urine pale yellow.  Return to your normal activities as told by your health care provider. Ask your health care provider what activities are safe for you. This information is not   intended to replace advice given to you by your health care provider. Make sure you discuss any questions you have with your health care provider. Document Revised: 12/30/2016 Document Reviewed: 08/12/2016 Elsevier Patient Education  2020 [employer].  

## 2019-04-08 ENCOUNTER — Ambulatory Visit
Admission: RE | Admit: 2019-04-08 | Discharge: 2019-04-08 | Disposition: A | Discharge status: Home / Self Care | Payer: PPO | Attending: Gastroenterology | Admitting: Gastroenterology

## 2019-04-08 ENCOUNTER — Other Ambulatory Visit: Payer: Self-pay

## 2019-04-08 ENCOUNTER — Encounter: Payer: Self-pay | Admitting: Gastroenterology

## 2019-04-08 ENCOUNTER — Encounter
Admission: RE | Disposition: A | Discharge status: Home / Self Care | Payer: Self-pay | Source: Home / Self Care | Attending: Gastroenterology

## 2019-04-08 ENCOUNTER — Ambulatory Visit: Payer: PPO | Admitting: Anesthesiology

## 2019-04-08 DIAGNOSIS — Z7982 Long term (current) use of aspirin: Secondary | ICD-10-CM | POA: Insufficient documentation

## 2019-04-08 DIAGNOSIS — H409 Unspecified glaucoma: Secondary | ICD-10-CM | POA: Diagnosis not present

## 2019-04-08 DIAGNOSIS — K5730 Diverticulosis of large intestine without perforation or abscess without bleeding: Secondary | ICD-10-CM | POA: Diagnosis not present

## 2019-04-08 DIAGNOSIS — Z1211 Encounter for screening for malignant neoplasm of colon: Secondary | ICD-10-CM | POA: Insufficient documentation

## 2019-04-08 DIAGNOSIS — E119 Type 2 diabetes mellitus without complications: Secondary | ICD-10-CM | POA: Insufficient documentation

## 2019-04-08 DIAGNOSIS — K648 Other hemorrhoids: Secondary | ICD-10-CM | POA: Diagnosis not present

## 2019-04-08 DIAGNOSIS — I10 Essential (primary) hypertension: Secondary | ICD-10-CM | POA: Diagnosis not present

## 2019-04-08 DIAGNOSIS — Z7984 Long term (current) use of oral hypoglycemic drugs: Secondary | ICD-10-CM | POA: Diagnosis not present

## 2019-04-08 DIAGNOSIS — E785 Hyperlipidemia, unspecified: Secondary | ICD-10-CM | POA: Diagnosis not present

## 2019-04-08 DIAGNOSIS — Z860100 Personal history of colon polyps, unspecified: Secondary | ICD-10-CM

## 2019-04-08 HISTORY — PX: COLONOSCOPY WITH PROPOFOL: SHX5780

## 2019-04-08 HISTORY — DX: Pain in left shoulder: M25.512

## 2019-04-08 LAB — GLUCOSE, CAPILLARY
Glucose-Capillary: 92 mg/dL (ref 70–99)
Glucose-Capillary: 102 mg/dL — ABNORMAL HIGH (ref 70–99)

## 2019-04-08 SURGERY — COLONOSCOPY WITH PROPOFOL
Anesthesia: General | Site: Rectum

## 2019-04-08 MED ORDER — PROPOFOL 10 MG/ML IV BOLUS
INTRAVENOUS | Status: DC | PRN
  Administered 2019-04-08 (×2): 100 mg via INTRAVENOUS

## 2019-04-08 MED ORDER — LIDOCAINE HCL (CARDIAC) PF 100 MG/5ML IV SOSY
PREFILLED_SYRINGE | INTRAVENOUS | Status: DC | PRN
  Administered 2019-04-08: 50 mg via INTRAVENOUS

## 2019-04-08 MED ORDER — LACTATED RINGERS IV SOLN
100.0000 mL/h | INTRAVENOUS | Status: DC
  Administered 2019-04-08: 100 mL/h via INTRAVENOUS

## 2019-04-08 MED ORDER — ONDANSETRON HCL 4 MG/2ML IJ SOLN: 4.0000 mg | Freq: Once | INTRAMUSCULAR | Status: DC | PRN

## 2019-04-08 MED ORDER — ACETAMINOPHEN 10 MG/ML IV SOLN: 1000.0000 mg | Freq: Once | INTRAVENOUS | Status: DC | PRN

## 2019-04-08 MED ORDER — STERILE WATER FOR IRRIGATION IR SOLN
Status: DC | PRN
  Administered 2019-04-08: .05 mL

## 2019-04-08 SURGICAL SUPPLY — 5 items
CANISTER SUCT 1200ML W/VALVE (MISCELLANEOUS) ×3 IMPLANT
GOWN CVR UNV OPN BCK APRN NK (MISCELLANEOUS) ×2 IMPLANT
GOWN ISOL THUMB LOOP REG UNIV (MISCELLANEOUS) ×6
KIT ENDO PROCEDURE OLY (KITS) ×3 IMPLANT
WATER STERILE IRR 250ML POUR (IV SOLUTION) ×3 IMPLANT

## 2019-04-08 NOTE — Transfer of Care (Signed)
Immediate Anesthesia Transfer of Care Note  Patient: Reginald Nielsen  Procedure(s) Performed: COLONOSCOPY WITH PROPOFOL (N/A Rectum)  Patient Location: PACU  Anesthesia Type: General  Level of Consciousness: awake, alert  and patient cooperative  Airway and Oxygen Therapy: Patient Spontanous Breathing and Patient connected to supplemental oxygen  Post-op Assessment: Post-op Vital signs reviewed, Patient's Cardiovascular Status Stable, Respiratory Function Stable, Patent Airway and No signs of Nausea or vomiting  Post-op Vital Signs: Reviewed and stable  Complications: No apparent anesthesia complications

## 2019-04-08 NOTE — Anesthesia Postprocedure Evaluation (Signed)
Anesthesia Post Note  Patient: Reginald Nielsen  Procedure(s) Performed: COLONOSCOPY WITH PROPOFOL (N/A Rectum)     Patient location during evaluation: PACU Anesthesia Type: General Level of consciousness: awake and alert Pain management: pain level controlled Vital Signs Assessment: post-procedure vital signs reviewed and stable Respiratory status: spontaneous breathing, nonlabored ventilation, respiratory function stable and patient connected to nasal cannula oxygen Cardiovascular status: blood pressure returned to baseline and stable Postop Assessment: no apparent nausea or vomiting Anesthetic complications: no    Kainon Varady A  Renaye Janicki

## 2019-04-08 NOTE — Interval H&P Note (Signed)
History and Physical Interval Note:  04/08/2019 9:21 AM  Reginald Nielsen  has presented today for surgery, with the diagnosis of Screening Z12.11.  The various methods of treatment have been discussed with the patient and family. After consideration of risks, benefits and other options for treatment, the patient has consented to  Procedure(s): COLONOSCOPY WITH PROPOFOL (N/A) as a surgical intervention.  The patient's history has been reviewed, patient examined, no change in status, stable for surgery.  I have reviewed the patient's chart and labs.  Questions were answered to the patient's satisfaction.     [name] [employer]

## 2019-04-08 NOTE — Anesthesia Preprocedure Evaluation (Addendum)
Anesthesia Evaluation  Patient identified by MRN, date of birth, ID band Patient awake    Reviewed: Allergy & Precautions, NPO status , Patient's Chart, lab work & pertinent test results  History of Anesthesia Complications Negative for: history of anesthetic complications  Airway Mallampati: II  TM Distance: >3 FB Neck ROM: Full    Dental  (+)    Pulmonary    breath sounds clear to auscultation       Cardiovascular hypertension, (-) angina(-) DOE  Rhythm:Regular Rate:Normal   HLD   Neuro/Psych    GI/Hepatic neg GERD  ,  Endo/Other  diabetes  Renal/GU      Musculoskeletal   Abdominal   Peds  Hematology   Anesthesia Other Findings   Reproductive/Obstetrics                            Anesthesia Physical Anesthesia Plan  ASA: II  Anesthesia Plan: General   Post-op Pain Management:    Induction: Intravenous  PONV Risk Score and Plan: 2 and Propofol infusion, TIVA and Treatment may vary due to age or medical condition  Airway Management Planned: Natural Airway and Nasal Cannula  Additional Equipment:   Intra-op Plan:   Post-operative Plan:   Informed Consent: I have reviewed the patients History and Physical, chart, labs and discussed the procedure including the risks, benefits and alternatives for the proposed anesthesia with the patient or authorized representative who has indicated his/her understanding and acceptance.       Plan Discussed with: CRNA and Anesthesiologist  Anesthesia Plan Comments:       Anesthesia Quick Evaluation

## 2019-04-08 NOTE — Op Note (Addendum)
[facility] Gastroenterology Patient Name: Reginald Nielsen Procedure Date: 04/08/2019 9:21 AM MRN: [id number] Account #: [id number] Date of Birth: 1960/03/08 Admit Type: Outpatient Age: 59 Room: [facility] OR ROOM 01 Gender: Male Note Status: Finalized Procedure:             Colonoscopy Indications:           Screening for colorectal malignant neoplasm Providers:             Midge Minium MD, MD Referring MD:          Duanne Limerick, MD (Referring MD) Medicines:             Propofol per Anesthesia Complications:         No immediate complications. Procedure:             Pre-Anesthesia Assessment:                        - Prior to the procedure, a History and Physical was                         performed, and patient medications and allergies were                         reviewed. The patient's tolerance of previous                         anesthesia was also reviewed. The risks and benefits                         of the procedure and the sedation options and risks                         were discussed with the patient. All questions were                         answered, and informed consent was obtained. Prior                         Anticoagulants: The patient has taken no previous                         anticoagulant or antiplatelet agents. ASA Grade                         Assessment: II - A patient with mild systemic disease.                         After reviewing the risks and benefits, the patient                         was deemed in satisfactory condition to undergo the                         procedure.                        After obtaining informed consent, the colonoscope was  passed under direct vision. Throughout the procedure,                         the patient's blood pressure, pulse, and oxygen                         saturations were monitored continuously. The was                         introduced through the anus and  advanced to the the                         cecum, identified by appendiceal orifice and ileocecal                         valve. The colonoscopy was performed without                         difficulty. The patient tolerated the procedure well.                         The quality of the bowel preparation was excellent. Findings:      The perianal and digital rectal examinations were normal.      Multiple small-mouthed diverticula were found in the sigmoid colon.      Non-bleeding internal hemorrhoids were found during retroflexion. The       hemorrhoids were Grade I (internal hemorrhoids that do not prolapse). Impression:            - Diverticulosis in the sigmoid colon.                        - Non-bleeding internal hemorrhoids.                        - No specimens collected. Recommendation:        - Discharge patient to home.                        - Resume previous diet.                        - Continue present medications.                        - Repeat colonoscopy in 10 years for surveillance. Procedure Code(s):     --- Professional ---                        [phone], Colonoscopy, flexible; diagnostic, including                         collection of specimen(s) by brushing or washing, when                         performed (separate procedure) Diagnosis Code(s):     --- Professional ---                        Z12.11, Encounter for screening for malignant neoplasm  of colon CPT copyright 2019 [employer]. All rights reserved. The codes documented in this report are preliminary and upon coder review may  be revised to meet current compliance requirements. Lucilla Lame MD, MD 04/08/2019 9:43:33 AM This report has been signed electronically. Number of Addenda: 0 Note Initiated On: 04/08/2019 9:21 AM Scope Withdrawal Time: 0 hours 9 minutes 5 seconds  Total Procedure Duration: 0 hours 11 minutes 49 seconds  Estimated Blood Loss:  Estimated  blood loss: none.      [facility]

## 2019-04-08 NOTE — Anesthesia Procedure Notes (Signed)
Procedure Name: General with mask airway Date/Time: 04/08/2019 9:32 AM Performed by: Jinny Blossom, CRNA Pre-anesthesia Checklist: Timeout performed, Patient being monitored, Suction available, Emergency Drugs available and Patient identified Patient Re-evaluated:Patient Re-evaluated prior to induction Oxygen Delivery Method: Nasal cannula

## 2019-04-09 ENCOUNTER — Encounter: Payer: Self-pay | Admitting: *Deleted

## 2019-04-23 DIAGNOSIS — H401132 Primary open-angle glaucoma, bilateral, moderate stage: Secondary | ICD-10-CM | POA: Diagnosis not present

## 2019-04-23 DIAGNOSIS — H401112 Primary open-angle glaucoma, right eye, moderate stage: Secondary | ICD-10-CM | POA: Diagnosis not present

## 2019-05-06 ENCOUNTER — Ambulatory Visit: Payer: PPO

## 2019-05-06 ENCOUNTER — Ambulatory Visit: Payer: Self-pay | Admitting: Family Medicine

## 2019-05-15 ENCOUNTER — Ambulatory Visit: Payer: Self-pay | Admitting: Family Medicine

## 2019-05-15 ENCOUNTER — Ambulatory Visit: Payer: PPO

## 2019-05-20 ENCOUNTER — Ambulatory Visit (INDEPENDENT_AMBULATORY_CARE_PROVIDER_SITE_OTHER): Payer: PPO | Admitting: Family Medicine

## 2019-05-20 ENCOUNTER — Ambulatory Visit: Payer: PPO

## 2019-05-20 ENCOUNTER — Encounter: Payer: Self-pay | Admitting: Family Medicine

## 2019-05-20 ENCOUNTER — Other Ambulatory Visit: Payer: Self-pay

## 2019-05-20 VITALS — BP 122/72 | HR 88 | Ht 77.0 in | Wt 228.0 lb

## 2019-05-20 DIAGNOSIS — E1142 Type 2 diabetes mellitus with diabetic polyneuropathy: Secondary | ICD-10-CM | POA: Diagnosis not present

## 2019-05-20 DIAGNOSIS — E782 Mixed hyperlipidemia: Secondary | ICD-10-CM | POA: Diagnosis not present

## 2019-05-20 NOTE — Progress Notes (Signed)
Date:  05/20/2019   Name:  Reginald Nielsen   DOB:  11-06-1960   MRN:  [id number]   Chief Complaint: Diabetes (no higher than 140)  Diabetes He presents for his follow-up diabetic visit. He has type 2 diabetes mellitus. His disease course has been fluctuating. There are no hypoglycemic associated symptoms. Pertinent negatives for hypoglycemia include no dizziness, headaches or nervousness/anxiousness. There are no diabetic associated symptoms. Pertinent negatives for diabetes include no chest pain and no polydipsia. There are no hypoglycemic complications. Symptoms are stable. There are no diabetic complications. Risk factors for coronary artery disease include dyslipidemia. Current diabetic treatment includes oral agent (dual therapy). His weight is fluctuating minimally. He is following a generally healthy diet. Meal planning includes avoidance of concentrated sweets and carbohydrate counting. He participates in exercise three times a week (squats qod). His home blood glucose trend is fluctuating minimally. His breakfast blood glucose is taken between 8-9 am. His breakfast blood glucose range is generally 70-90 mg/dl. An ACE inhibitor/angiotensin II receptor blocker is not being taken. He does not see a podiatrist.Eye exam is not current.    Lab Results  Component Value Date   CREATININE 1.23 02/13/2019   BUN 15 02/13/2019   NA 140 02/13/2019   K 5.2 02/13/2019   CL 102 02/13/2019   CO2 23 02/13/2019   Lab Results  Component Value Date   CHOL 194 02/13/2019   HDL 42 02/13/2019   LDLCALC 125 (H) 02/13/2019   TRIG 153 (H) 02/13/2019   No results found for: TSH Lab Results  Component Value Date   HGBA1C 8.4 (H) 02/13/2019   No results found for: WBC, HGB, HCT, MCV, PLT No results found for: ALT, AST, GGT, ALKPHOS, BILITOT   Review of Systems  Constitutional: Negative for chills and fever.  HENT: Negative for drooling, ear discharge, ear pain and sore throat.   Respiratory:  Negative for cough, shortness of breath and wheezing.   Cardiovascular: Negative for chest pain, palpitations and leg swelling.  Gastrointestinal: Negative for abdominal pain, blood in stool, constipation, diarrhea and nausea.  Endocrine: Negative for polydipsia.  Genitourinary: Negative for dysuria, frequency, hematuria and urgency.  Musculoskeletal: Negative for back pain, myalgias and neck pain.  Skin: Negative for rash.  Allergic/Immunologic: Negative for environmental allergies.  Neurological: Negative for dizziness and headaches.  Hematological: Does not bruise/bleed easily.  Psychiatric/Behavioral: Negative for suicidal ideas. The patient is not nervous/anxious.     Patient Active Problem List   Diagnosis Date Noted  . Special screening for malignant neoplasms, colon     No Known Allergies  Past Surgical History:  Procedure Laterality Date  . COLONOSCOPY WITH PROPOFOL N/A 04/08/2019   Procedure: COLONOSCOPY WITH PROPOFOL;  Surgeon: Midge Minium, MD;  Location: [facility];  Service: Endoscopy;  Laterality: N/A;  . frozen shoulder    . SHOULDER SURGERY     2015 and 2016    Social History   Tobacco Use  . Smoking status: Never Smoker  . Smokeless tobacco: Current User    Types: Chew  Substance Use Topics  . Alcohol use: Not Currently    Comment: quit 11/2018  . Drug use: Not Currently     Medication list has been reviewed and updated.  Current Meds  Medication Sig  . aspirin EC 81 MG tablet Take 81 mg by mouth daily.  . COMBIGAN 0.2-0.5 % ophthalmic solution Apply 1 drop to eye 2 (two) times daily.  Marland Kitchen glipiZIDE (GLUCOTROL) 5  MG tablet Take 1 tablet (5 mg total) by mouth 2 (two) times daily before a meal.  . latanoprost (XALATAN) 0.005 % ophthalmic solution   . meloxicam (MOBIC) 15 MG tablet TAKE ONE TABLET BY MOUTH ONCE DAILY. NEEDS FOLLOW UP APPOINTMENT FOR FURTHER REFILLS  . metFORMIN (GLUCOPHAGE) 500 MG tablet Take 1 tablet (500 mg total) by mouth 2  (two) times daily with a meal.  . OneTouch Delica Lancets 09U MISC USE DAILY  . ONETOUCH ULTRA test strip USE TO TEST BLOOD SUGAR ONCE DAILY    PHQ 2/9 Scores 02/13/2019 12/31/2018  PHQ - 2 Score 0 0  PHQ- 9 Score 0 0    BP Readings from Last 3 Encounters:  05/20/19 122/72  04/08/19 105/77  03/25/19 (!) 142/91    Physical Exam Vitals and nursing note reviewed.  HENT:     Head: Normocephalic.     Right Ear: External ear normal.     Left Ear: External ear normal.     Nose: Nose normal.  Eyes:     General: No scleral icterus.       Right eye: No discharge.        Left eye: No discharge.     Conjunctiva/sclera: Conjunctivae normal.     Pupils: Pupils are equal, round, and reactive to light.  Neck:     Thyroid: No thyromegaly.     Vascular: No JVD.     Trachea: No tracheal deviation.  Cardiovascular:     Rate and Rhythm: Normal rate and regular rhythm.     Heart sounds: Normal heart sounds. No murmur. No friction rub. No gallop.   Pulmonary:     Effort: No respiratory distress.     Breath sounds: Normal breath sounds. No wheezing, rhonchi or rales.  Abdominal:     General: Bowel sounds are normal.     Palpations: Abdomen is soft. There is no mass.     Tenderness: There is no abdominal tenderness. There is no guarding or rebound.  Musculoskeletal:        General: No tenderness. Normal range of motion.     Cervical back: Normal range of motion and neck supple.  Lymphadenopathy:     Cervical: No cervical adenopathy.  Skin:    General: Skin is warm.     Findings: No rash.  Neurological:     Mental Status: He is alert and oriented to person, place, and time.     Cranial Nerves: No cranial nerve deficit.     Deep Tendon Reflexes: Reflexes are normal and symmetric.     Wt Readings from Last 3 Encounters:  05/20/19 228 lb (103.4 kg)  04/08/19 228 lb (103.4 kg)  03/25/19 234 lb 3.2 oz (106.2 kg)    BP 122/72   Pulse 88   Ht 6\' 5"  (1.956 m)   Wt 228 lb (103.4 kg)    BMI 27.04 kg/m   Assessment and Plan: 1. Type 2 diabetes mellitus with diabetic polyneuropathy, without long-term current use of insulin (HCC) Chronic.  Previously uncontrolled.  Currently stable with blood sugars under 100 fasting.  Patient has done much better with his dietary intake as far as calorie counts and limiting concentrated sugars.  His blood readings have been in excellent range and therefore we have given at the time to see on his usual dosing of glipizide 5 mg twice a day and Metformin twice a day 500 mg if this is been enough to get his A1c into a normal range.  We will repeat A1c today - Hemoglobin A1c  2. Moderate mixed hyperlipidemia not requiring statin therapy Reviewed with patient's lipid notes that he has an elevated LDL in the 125 range.  I am hoping that the change in his dietary intake will also translate into a better coverage of his LDL and triglycerides which will be checked as well today. - Lipid Panel With LDL/HDL Ratio

## 2019-05-21 LAB — LIPID PANEL WITH LDL/HDL RATIO
Cholesterol, Total: 197 mg/dL (ref 100–199)
HDL: 43 mg/dL (ref >39)
LDL Chol Calc (NIH): 121 mg/dL — ABNORMAL HIGH (ref 0–99)
LDL/HDL Ratio: 2.8 ratio (ref 0.0–3.6)
Triglycerides: 188 mg/dL — ABNORMAL HIGH (ref 0–149)
VLDL Cholesterol Cal: 33 mg/dL (ref 5–40)

## 2019-05-21 LAB — HEMOGLOBIN A1C
Est. average glucose Bld gHb Est-mCnc: 103 mg/dL
Hgb A1c MFr Bld: 5.2 % (ref 4.8–5.6)

## 2019-05-22 ENCOUNTER — Ambulatory Visit: Payer: PPO

## 2019-05-22 ENCOUNTER — Ambulatory Visit (INDEPENDENT_AMBULATORY_CARE_PROVIDER_SITE_OTHER): Payer: PPO

## 2019-05-22 DIAGNOSIS — Z0000 Encounter for general adult medical examination without abnormal findings: Secondary | ICD-10-CM | POA: Diagnosis not present

## 2019-05-22 NOTE — Patient Instructions (Signed)
[name] , Thank you for taking time to come for your Medicare Wellness Visit. I appreciate your ongoing commitment to your health goals. Please review the following plan we discussed and let me know if I can assist you in the future.   Screening recommendations/referrals: Colonoscopy: done 04/08/19 Recommended yearly ophthalmology/optometry visit for glaucoma screening and checkup Recommended yearly dental visit for hygiene and checkup  Vaccinations: Influenza vaccine: done 02/13/19 Pneumococcal vaccine: due at age 58 Tdap vaccine: done 2013 Shingles vaccine: Shingrix discussed. Please contact your pharmacy for coverage information.  Covid-19: 1st dose 04/07/19 due for second dose  Advanced directives: Advance directive discussed with you today. I have provided a copy for you to complete at home and have notarized. Once this is complete please bring a copy in to our office so we can scan it into your chart.  Conditions/risks identified: Keep up the great work!  Next appointment: Please follow up in one year for your Medicare Annual Wellness visit.    Preventive Care 41 Years and Older, Male Preventive care refers to lifestyle choices and visits with your health care provider that can promote health and wellness. What does preventive care include?  A yearly physical exam. This is also called an annual well check.  Dental exams once or twice a year.  Routine eye exams. Ask your health care provider how often you should have your eyes checked.  Personal lifestyle choices, including:  Daily care of your teeth and gums.  Regular physical activity.  Eating a healthy diet.  Avoiding tobacco and drug use.  Limiting alcohol use.  Practicing safe sex.  Taking low doses of aspirin every day.  Taking vitamin and mineral supplements as recommended by your health care provider. What happens during an annual well check? The services and screenings done by your health care provider  during your annual well check will depend on your age, overall health, lifestyle risk factors, and family history of disease. Counseling  Your health care provider may ask you questions about your:  Alcohol use.  Tobacco use.  Drug use.  Emotional well-being.  Home and relationship well-being.  Sexual activity.  Eating habits.  History of falls.  Memory and ability to understand (cognition).  Work and work Astronomer. Screening  You may have the following tests or measurements:  Height, weight, and BMI.  Blood pressure.  Lipid and cholesterol levels. These may be checked every 5 years, or more frequently if you are over 58 years old.  Skin check.  Lung cancer screening. You may have this screening every year starting at age 58 if you have a 30-pack-year history of smoking and currently smoke or have quit within the past 15 years.  Fecal occult blood test (FOBT) of the stool. You may have this test every year starting at age 58.  Flexible sigmoidoscopy or colonoscopy. You may have a sigmoidoscopy every 5 years or a colonoscopy every 10 years starting at age 58.  Prostate cancer screening. Recommendations will vary depending on your family history and other risks.  Hepatitis C blood test.  Hepatitis B blood test.  Sexually transmitted disease (STD) testing.  Diabetes screening. This is done by checking your blood sugar (glucose) after you have not eaten for a while (fasting). You may have this done every 1-3 years.  Abdominal aortic aneurysm (AAA) screening. You may need this if you are a current or former smoker.  Osteoporosis. You may be screened starting at age 58 if you are at high  risk. Talk with your health care provider about your test results, treatment options, and if necessary, the need for more tests. Vaccines  Your health care provider may recommend certain vaccines, such as:  Influenza vaccine. This is recommended every year.  Tetanus,  diphtheria, and acellular pertussis (Tdap, Td) vaccine. You may need a Td booster every 10 years.  Zoster vaccine. You may need this after age 58.  Pneumococcal 13-valent conjugate (PCV13) vaccine. One dose is recommended after age 58.  Pneumococcal polysaccharide (PPSV23) vaccine. One dose is recommended after age 58. Talk to your health care provider about which screenings and vaccines you need and how often you need them. This information is not intended to replace advice given to you by your health care provider. Make sure you discuss any questions you have with your health care provider. Document Released: 01/23/2015 Document Revised: 09/16/2015 Document Reviewed: 10/28/2014 Elsevier Interactive Patient Education  2017 [employer] Prevention in the Home Falls can cause injuries. They can happen to people of all ages. There are many things you can do to make your home safe and to help prevent falls. What can I do on the outside of my home?  Regularly fix the edges of walkways and driveways and fix any cracks.  Remove anything that might make you trip as you walk through a door, such as a raised step or threshold.  Trim any bushes or trees on the path to your home.  Use bright outdoor lighting.  Clear any walking paths of anything that might make someone trip, such as rocks or tools.  Regularly check to see if handrails are loose or broken. Make sure that both sides of any steps have handrails.  Any raised decks and porches should have guardrails on the edges.  Have any leaves, snow, or ice cleared regularly.  Use sand or salt on walking paths during winter.  Clean up any spills in your garage right away. This includes oil or grease spills. What can I do in the bathroom?  Use night lights.  Install grab bars by the toilet and in the tub and shower. Do not use towel bars as grab bars.  Use non-skid mats or decals in the tub or shower.  If you need to sit down in  the shower, use a plastic, non-slip stool.  Keep the floor dry. Clean up any water that spills on the floor as soon as it happens.  Remove soap buildup in the tub or shower regularly.  Attach bath mats securely with double-sided non-slip rug tape.  Do not have throw rugs and other things on the floor that can make you trip. What can I do in the bedroom?  Use night lights.  Make sure that you have a light by your bed that is easy to reach.  Do not use any sheets or blankets that are too big for your bed. They should not hang down onto the floor.  Have a firm chair that has side arms. You can use this for support while you get dressed.  Do not have throw rugs and other things on the floor that can make you trip. What can I do in the kitchen?  Clean up any spills right away.  Avoid walking on wet floors.  Keep items that you use a lot in easy-to-reach places.  If you need to reach something above you, use a strong step stool that has a grab bar.  Keep electrical cords out of the way.  Do not use floor polish or wax that makes floors slippery. If you must use wax, use non-skid floor wax.  Do not have throw rugs and other things on the floor that can make you trip. What can I do with my stairs?  Do not leave any items on the stairs.  Make sure that there are handrails on both sides of the stairs and use them. Fix handrails that are broken or loose. Make sure that handrails are as long as the stairways.  Check any carpeting to make sure that it is firmly attached to the stairs. Fix any carpet that is loose or worn.  Avoid having throw rugs at the top or bottom of the stairs. If you do have throw rugs, attach them to the floor with carpet tape.  Make sure that you have a light switch at the top of the stairs and the bottom of the stairs. If you do not have them, ask someone to add them for you. What else can I do to help prevent falls?  Wear shoes that:  Do not have high  heels.  Have rubber bottoms.  Are comfortable and fit you well.  Are closed at the toe. Do not wear sandals.  If you use a stepladder:  Make sure that it is fully opened. Do not climb a closed stepladder.  Make sure that both sides of the stepladder are locked into place.  Ask someone to hold it for you, if possible.  Clearly mark and make sure that you can see:  Any grab bars or handrails.  First and last steps.  Where the edge of each step is.  Use tools that help you move around (mobility aids) if they are needed. These include:  Canes.  Walkers.  Scooters.  Crutches.  Turn on the lights when you go into a dark area. Replace any light bulbs as soon as they burn out.  Set up your furniture so you have a clear path. Avoid moving your furniture around.  If any of your floors are uneven, fix them.  If there are any pets around you, be aware of where they are.  Review your medicines with your doctor. Some medicines can make you feel dizzy. This can increase your chance of falling. Ask your doctor what other things that you can do to help prevent falls. This information is not intended to replace advice given to you by your health care provider. Make sure you discuss any questions you have with your health care provider. Document Released: 10/23/2008 Document Revised: 06/04/2015 Document Reviewed: 01/31/2014 Elsevier Interactive Patient Education  2017 [employer]..

## 2019-05-22 NOTE — Progress Notes (Signed)
Subjective:   Reginald Nielsen is a 59 y.o. male who presents for an Initial Medicare Annual Wellness Visit.  Virtual Visit via Telephone Note  I connected with  Reginald Nielsen on 05/22/19 at 10:40 AM EDT by telephone and verified that I am speaking with the correct person using two identifiers.  Medicare Annual Wellness visit completed telephonically due to Covid-19 pandemic.   Location: Patient: home Provider: office   I discussed the limitations, risks, security and privacy concerns of performing an evaluation and management service by telephone and the availability of in person appointments. The patient expressed understanding and agreed to proceed.  Unable to perform video visit due to patient does not have video capability.   Some vital signs may be absent or patient reported.   Clemetine Marker, LPN    Review of Systems   Cardiac Risk Factors include: advanced age (>13mn, >>49women);diabetes mellitus;male gender    Objective:    Today's Vitals   05/22/19 1053  PainSc: 5    There is no height or weight on file to calculate BMI.  Advanced Directives 05/22/2019 04/08/2019  Does Patient Have a Medical Advance Directive? No No  Would patient like information on creating a medical advance directive? Yes (MAU/Ambulatory/Procedural Areas - Information given) Yes (MAU/Ambulatory/Procedural Areas - Information given)    Current Medications (verified) Outpatient Encounter Medications as of 05/22/2019  Medication Sig  . aspirin EC 81 MG tablet Take 81 mg by mouth daily.  . Blood Glucose Monitoring Suppl (ONE TOUCH ULTRA 2) w/Device KIT   . COMBIGAN 0.2-0.5 % ophthalmic solution Apply 1 drop to eye 2 (two) times daily.  . dorzolamide-timolol (COSOPT) 22.3-6.8 MG/ML ophthalmic solution   . glipiZIDE (GLUCOTROL) 5 MG tablet Take 1 tablet (5 mg total) by mouth 2 (two) times daily before a meal.  . latanoprost (XALATAN) 0.005 % ophthalmic solution   . meloxicam (MOBIC) 15 MG  tablet TAKE ONE TABLET BY MOUTH ONCE DAILY. NEEDS FOLLOW UP APPOINTMENT FOR FURTHER REFILLS  . metFORMIN (GLUCOPHAGE) 500 MG tablet Take 1 tablet (500 mg total) by mouth 2 (two) times daily with a meal.  . OneTouch Delica Lancets 363WMISC USE DAILY  . ONETOUCH ULTRA test strip USE TO TEST BLOOD SUGAR ONCE DAILY   No facility-administered encounter medications on file as of 05/22/2019.    Allergies (verified) Patient has no known allergies.   History: Past Medical History:  Diagnosis Date  . Diabetes mellitus without complication ([place]    type 2  . Glaucoma   . Hyperlipidemia   . Hypertension   . Shoulder pain, left    Past Surgical History:  Procedure Laterality Date  . COLONOSCOPY WITH PROPOFOL N/A 04/08/2019   Procedure: COLONOSCOPY WITH PROPOFOL;  Surgeon: WLucilla Lame MD;  Location: [facility]  Service: Endoscopy;  Laterality: N/A;  . frozen shoulder    . SHOULDER SURGERY     2015 and 2016   Family History  Problem Relation Age of Onset  . Diabetes Father   . Cancer Father        lung  . COPD Father   . Heart attack Father   . Stroke Father   . Diabetes Paternal Aunt    Social History   Socioeconomic History  . Marital status: Married    Spouse name: Not on file  . Number of children: 1  . Years of education: Not on file  . Highest education level: High school graduate  Occupational History  .  Occupation: retired    Comment: truck Geophysicist/field seismologist  Tobacco Use  . Smoking status: Never Smoker  . Smokeless tobacco: Current User    Types: Chew  Substance and Sexual Activity  . Alcohol use: Not Currently    Comment: quit 11/2017  . Drug use: Not Currently  . Sexual activity: Not Currently  Other Topics Concern  . Not on file  Social History Narrative  . Not on file   Social Determinants of Health   Financial Resource Strain: Low Risk   . Difficulty of Paying Living Expenses: Not very hard  Food Insecurity: No Food Insecurity  . Worried About Paediatric nurse in the Last Year: Never true  . Ran Out of Food in the Last Year: Never true  Transportation Needs: No Transportation Needs  . Lack of Transportation (Medical): No  . Lack of Transportation (Non-Medical): No  Physical Activity: Insufficiently Active  . Days of Exercise per Week: 4 days  . Minutes of Exercise per Session: 30 min  Stress: No Stress Concern Present  . Feeling of Stress : Only a little  Social Connections: Unknown  . Frequency of Communication with Friends and Family: Patient refused  . Frequency of Social Gatherings with Friends and Family: Patient refused  . Attends Religious Services: Patient refused  . Active Member of Clubs or Organizations: Patient refused  . Attends Archivist Meetings: Patient refused  . Marital Status: Married   Tobacco Counseling Ready to quit: Not Answered Counseling given: Not Answered    Clinical Intake:  Pre-visit preparation completed: Yes  Pain : 0-10 Pain Score: 5  Pain Type: Chronic pain Pain Location: Shoulder Pain Orientation: Right Pain Descriptors / Indicators: Aching, Sore Pain Onset: More than a month ago Pain Frequency: Constant     Nutritional Risks: None Diabetes: Yes CBG done?: No Did pt. bring in CBG monitor from home?: No  How often do you need to have someone help you when you read instructions, pamphlets, or other written materials from your doctor or pharmacy?: 1 - Never  Interpreter Needed?: No  Information entered by :: Clemetine Marker LPN  Activities of Daily Living In your present state of health, do you have any difficulty performing the following activities: 05/22/2019 04/08/2019  Hearing? N N  Comment declines hearing aids -  Vision? N N  Difficulty concentrating or making decisions? N N  Walking or climbing stairs? N N  Dressing or bathing? N N  Doing errands, shopping? N -  Preparing Food and eating ? N -  Using the Toilet? N -  In the past six months, have you  accidently leaked urine? N -  Do you have problems with loss of bowel control? N -  Managing your Medications? N -  Managing your Finances? N -  Housekeeping or managing your Housekeeping? N -  Some recent data might be hidden     Immunizations and Health Maintenance Immunization History  Administered Date(s) Administered  . Influenza,inj,Quad PF,6+ Mos 02/13/2019  . Moderna SARS-COVID-2 Vaccination 04/07/2019  . Tdap 01/11/2011   Health Maintenance Due  Topic Date Due  . COVID-19 Vaccine (2 - Moderna 2-dose series) 05/05/2019    Patient Care Team: Juline Patch, MD as PCP - General (Family Medicine)  Indicate any recent Medical Services you may have received from other than [facility] providers in the past year (date may be approximate).    Assessment:   This is a routine wellness examination for [place].  Hearing/Vision  screen  Hearing Screening   '125Hz'  '250Hz'  '500Hz'  '1000Hz'  '2000Hz'  '3000Hz'  '4000Hz'  '6000Hz'  '8000Hz'   Right ear:           Left ear:           Comments: Pt declines hearing difficulty  Vision Screening Comments: Annual vision screenings done by Dr. Neville Route at [facility]  Dietary issues and exercise activities discussed: Current Exercise Habits: Home exercise routine, Type of exercise: walking;calisthenics, Time (Minutes): 30, Frequency (Times/Week): 4, Weekly Exercise (Minutes/Week): 120, Intensity: Moderate, Exercise limited by: orthopedic condition(s)  Goals    . Patient Stated     Patient states he would like to maintain overall health, A1c and increase physical activity      Depression Screen PHQ 2/9 Scores 05/22/2019 02/13/2019 12/31/2018  PHQ - 2 Score 1 0 0  PHQ- 9 Score 6 0 0    Fall Risk Fall Risk  05/22/2019 05/20/2019 02/13/2019 12/31/2018  Falls in the past year? 0 0 0 0  Number falls in past yr: 0 - - -  Injury with Fall? 0 - - -  Risk for fall due to : No Fall Risks - - -  Follow up Falls prevention discussed Falls evaluation completed Falls  evaluation completed Falls evaluation completed    [place]:  Any stairs in or around the home? Yes  If so, do they handrails? Yes   Home free of loose throw rugs in walkways, pet beds, electrical cords, etc? Yes  Adequate lighting in your home to reduce risk of falls? Yes   ASSISTIVE DEVICES UTILIZED TO PREVENT FALLS:  Life alert? No  Use of a cane, walker or w/c? No  Grab bars in the bathroom? Yes  Shower chair or bench in shower? No  Elevated toilet seat or a handicapped toilet? No   DME ORDERS:  DME order needed?  No   TIMED UP AND GO:  Was the test performed? No . Telephonic visit  Education: Fall risk prevention has been discussed.  Intervention(s) required? No    Cognitive Function:     6CIT Screen 05/22/2019  What Year? 0 points  What month? 0 points  What time? 0 points  Count back from 20 0 points  Months in reverse 0 points  Repeat phrase 0 points  Total Score 0    Screening Tests Health Maintenance  Topic Date Due  . COVID-19 Vaccine (2 - Moderna 2-dose series) 05/05/2019  . INFLUENZA VACCINE  08/11/2019  . URINE MICROALBUMIN  02/13/2020  . TETANUS/TDAP  01/10/2021  . COLONOSCOPY  04/07/2029  . Hepatitis C Screening  Completed  . HIV Screening  Completed    Qualifies for Shingles Vaccine? Yes  . Due for Shingrix. Education has been provided regarding the importance of this vaccine. Pt has been advised to call insurance company to determine out of pocket expense. Advised may also receive vaccine at local pharmacy or Health Dept. Verbalized acceptance and understanding.  Tdap: Up to date  Flu Vaccine: Up to date  Pneumococcal Vaccine: Due for Pneumococcal vaccine at age 31.  Covid-19 Vaccine: Due for second Covid-19 vaccine.   Cancer Screenings:  Colorectal Screening: Completed 04/08/19. Repeat every 10 years;   Lung Cancer Screening: (Low Dose CT Chest recommended if Age 63-80 years, 30 pack-year  currently smoking OR have quit w/in 15years.) does not qualify.   Additional Screening:  Hepatitis C Screening: does qualify; postponed  Vision Screening: Recommended annual ophthalmology exams for early detection  of glaucoma and other disorders of the eye. Is the patient up to date with their annual eye exam?  Yes  Who is the provider or what is the name of the office in which the pt attends annual eye exams? [place] Screening: Recommended annual dental exams for proper oral hygiene  Community Resource Referral:  CRR required this visit?  No       Plan:    I have personally reviewed and addressed the Medicare Annual Wellness questionnaire and have noted the following in the patient's chart:  A. Medical and social history B. Use of alcohol, tobacco or illicit drugs  C. Current medications and supplements D. Functional ability and status E.  Nutritional status F.  Physical activity G. Advance directives H. List of other physicians I.  Hospitalizations, surgeries, and ER visits in previous 12 months J.  [place] such as hearing and vision if needed, cognitive and depression L. Referrals and appointments   In addition, I have reviewed and discussed with patient certain preventive protocols, quality metrics, and best practice recommendations. A written personalized care plan for preventive services as well as general preventive health recommendations were provided to patient.   Signed,  Clemetine Marker, LPN Nurse Health Advisor   Nurse Notes: none

## 2019-06-13 ENCOUNTER — Ambulatory Visit: Payer: PPO | Admitting: Family Medicine

## 2019-06-24 ENCOUNTER — Other Ambulatory Visit: Payer: Self-pay | Admitting: Family Medicine

## 2019-06-24 DIAGNOSIS — E1142 Type 2 diabetes mellitus with diabetic polyneuropathy: Secondary | ICD-10-CM

## 2019-06-24 DIAGNOSIS — S43422A Sprain of left rotator cuff capsule, initial encounter: Secondary | ICD-10-CM

## 2019-06-24 MED ORDER — METFORMIN HCL 500 MG PO TABS: 500.0000 mg | ORAL_TABLET | Freq: Two times a day (BID) | ORAL | 1 refills | Status: DC

## 2019-06-24 MED ORDER — MELOXICAM 15 MG PO TABS: 15.0000 mg | ORAL_TABLET | Freq: Every day | ORAL | 0 refills | Status: DC

## 2019-06-24 MED ORDER — GLIPIZIDE 5 MG PO TABS: 5.0000 mg | ORAL_TABLET | Freq: Two times a day (BID) | ORAL | 1 refills | Status: DC

## 2019-06-24 NOTE — Telephone Encounter (Signed)
PT needs refill  meloxicam (MOBIC) 15 MG tablet [[id number]]  glipiZIDE (GLUCOTROL) 5 MG tablet [[id number]]  metFORMIN (GLUCOPHAGE) 500 MG tablet [[id number]]  [facility] #[id number] - [place], [place] - [address] [place] [address] AT [facility] OF [address]  [address] [place] [address] [place] [address]  Phone: [phone] Fax: [phone]

## 2019-06-24 NOTE — Telephone Encounter (Signed)
Requested Prescriptions  Pending Prescriptions Disp Refills  . metFORMIN (GLUCOPHAGE) 500 MG tablet 180 tablet 1    Sig: Take 1 tablet (500 mg total) by mouth 2 (two) times daily with a meal.     Endocrinology:  Diabetes - Biguanides Passed - 06/24/2019  1:38 PM      Passed - Cr in normal range and within 360 days    Creatinine, Ser  Date Value Ref Range Status  02/13/2019 1.23 0.76 - 1.27 mg/dL Final         Passed - HBA1C is between 0 and 7.9 and within 180 days    Hgb A1c MFr Bld  Date Value Ref Range Status  05/20/2019 5.2 4.8 - 5.6 % Final    Comment:             Prediabetes: 5.7 - 6.4          Diabetes: >6.4          Glycemic control for adults with diabetes: <7.0          Passed - eGFR in normal range and within 360 days    GFR calc Af Amer  Date Value Ref Range Status  02/13/2019 75 >59 mL/min/1.73 Final   GFR calc non Af Amer  Date Value Ref Range Status  02/13/2019 65 >59 mL/min/1.73 Final         Passed - Valid encounter within last 6 months    Recent Outpatient Visits          1 month ago Type 2 diabetes mellitus with diabetic polyneuropathy, without long-term current use of insulin ([place])   [facility] Juline Patch, MD   4 months ago Annual physical exam   [facility] Juline Patch, MD   5 months ago Establishing care with new doctor, encounter for   [facility] Juline Patch, MD             . meloxicam (MOBIC) 15 MG tablet 30 tablet 0     Analgesics:  COX2 Inhibitors Failed - 06/24/2019  1:38 PM      Failed - HGB in normal range and within 360 days    No results found for: HGB, HGBKUC, HGBPOCKUC, HGBOTHER, TOTHGB, HGBPLASMA       Passed - Cr in normal range and within 360 days    Creatinine, Ser  Date Value Ref Range Status  02/13/2019 1.23 0.76 - 1.27 mg/dL Final         Passed - Patient is not pregnant      Passed - Valid encounter within last 12 months    Recent Outpatient Visits          1 month ago  Type 2 diabetes mellitus with diabetic polyneuropathy, without long-term current use of insulin ([place])   [facility] Juline Patch, MD   4 months ago Annual physical exam   [facility] Juline Patch, [facility]   5 months ago Establishing care with new doctor, encounter for   [facility] Juline Patch, MD             . glipiZIDE (GLUCOTROL) 5 MG tablet 180 tablet 1    Sig: Take 1 tablet (5 mg total) by mouth 2 (two) times daily before a meal.     Endocrinology:  Diabetes - Sulfonylureas Passed - 06/24/2019  1:38 PM      Passed - HBA1C is between 0 and 7.9 and within  180 days    Hgb A1c MFr Bld  Date Value Ref Range Status  05/20/2019 5.2 4.8 - 5.6 % Final    Comment:             Prediabetes: 5.7 - 6.4          Diabetes: >6.4          Glycemic control for adults with diabetes: <7.0          Passed - Valid encounter within last 6 months    Recent Outpatient Visits          1 month ago Type 2 diabetes mellitus with diabetic polyneuropathy, without long-term current use of insulin ([place])   [facility] Juline Patch, MD   4 months ago Annual physical exam   [facility] Juline Patch, MD   5 months ago Establishing care with new doctor, encounter for   [facility] Juline Patch, MD

## 2019-06-24 NOTE — Telephone Encounter (Signed)
Requested medication (s) are due for refill today: yes  Requested medication (s) are on the active medication list: yes  Last refill: 03/27/19  Future visit scheduled: No  Notes to clinic:  Last fill states needs appointment. Appointment was 05/20/19 No mention of this medication    Requested Prescriptions  Pending Prescriptions Disp Refills   meloxicam (MOBIC) 15 MG tablet 30 tablet 0      Analgesics:  COX2 Inhibitors Failed - 06/24/2019  1:38 PM      Failed - HGB in normal range and within 360 days    No results found for: HGB, HGBKUC, HGBPOCKUC, HGBOTHER, TOTHGB, HGBPLASMA        Passed - Cr in normal range and within 360 days    Creatinine, Ser  Date Value Ref Range Status  02/13/2019 1.23 0.76 - 1.27 mg/dL Final          Passed - Patient is not pregnant      Passed - Valid encounter within last 12 months    Recent Outpatient Visits           1 month ago Type 2 diabetes mellitus with diabetic polyneuropathy, without long-term current use of insulin ([place])   [facility] Juline Patch, MD   4 months ago Annual physical exam   [facility] Juline Patch, MD   5 months ago Establishing care with new doctor, encounter for   [facility] Juline Patch, MD               Signed Prescriptions Disp Refills   metFORMIN (GLUCOPHAGE) 500 MG tablet 180 tablet 1    Sig: Take 1 tablet (500 mg total) by mouth 2 (two) times daily with a meal.      Endocrinology:  Diabetes - Biguanides Passed - 06/24/2019  1:38 PM      Passed - Cr in normal range and within 360 days    Creatinine, Ser  Date Value Ref Range Status  02/13/2019 1.23 0.76 - 1.27 mg/dL Final          Passed - HBA1C is between 0 and 7.9 and within 180 days    Hgb A1c MFr Bld  Date Value Ref Range Status  05/20/2019 5.2 4.8 - 5.6 % Final    Comment:             Prediabetes: 5.7 - 6.4          Diabetes: >6.4          Glycemic control for adults with diabetes: <7.0            Passed - eGFR in normal range and within 360 days    GFR calc Af Amer  Date Value Ref Range Status  02/13/2019 75 >59 mL/min/1.73 Final   GFR calc non Af Amer  Date Value Ref Range Status  02/13/2019 65 >59 mL/min/1.73 Final          Passed - Valid encounter within last 6 months    Recent Outpatient Visits           1 month ago Type 2 diabetes mellitus with diabetic polyneuropathy, without long-term current use of insulin ([place])   [facility] Juline Patch, MD   4 months ago Annual physical exam   [facility] Juline Patch, MD   5 months ago Establishing care with new doctor, encounter for   [facility] Juline Patch, MD  glipiZIDE (GLUCOTROL) 5 MG tablet 180 tablet 1    Sig: Take 1 tablet (5 mg total) by mouth 2 (two) times daily before a meal.      Endocrinology:  Diabetes - Sulfonylureas Passed - 06/24/2019  1:38 PM      Passed - HBA1C is between 0 and 7.9 and within 180 days    Hgb A1c MFr Bld  Date Value Ref Range Status  05/20/2019 5.2 4.8 - 5.6 % Final    Comment:             Prediabetes: 5.7 - 6.4          Diabetes: >6.4          Glycemic control for adults with diabetes: <7.0           Passed - Valid encounter within last 6 months    Recent Outpatient Visits           1 month ago Type 2 diabetes mellitus with diabetic polyneuropathy, without long-term current use of insulin ([place])   [facility] Juline Patch, MD   4 months ago Annual physical exam   [facility] Juline Patch, MD   5 months ago Establishing care with new doctor, encounter for   [facility] Juline Patch, MD

## 2019-07-02 ENCOUNTER — Encounter: Payer: PPO | Admitting: Family Medicine

## 2019-07-02 ENCOUNTER — Encounter: Payer: Self-pay | Admitting: Family Medicine

## 2019-07-02 ENCOUNTER — Other Ambulatory Visit: Payer: Self-pay

## 2019-07-02 NOTE — Progress Notes (Signed)
Pharmacy told pt he needed to be seen before more refills. I called pharmacy to verify that they had received the 6 months refills from dr Reginald Nielsen. Pt doesn't need to be seen today

## 2019-07-03 ENCOUNTER — Other Ambulatory Visit: Payer: Self-pay

## 2019-07-03 ENCOUNTER — Telehealth: Payer: Self-pay | Admitting: Family Medicine

## 2019-07-03 MED ORDER — ONETOUCH DELICA LANCETS 30G MISC: 0 refills | Status: DC

## 2019-07-03 MED ORDER — ONETOUCH ULTRA VI STRP: ORAL_STRIP | 0 refills | Status: DC

## 2019-07-03 NOTE — Telephone Encounter (Signed)
Sent in RF for both at [facility] in [place]. 100 with 0 RF.  KP

## 2019-07-03 NOTE — Telephone Encounter (Signed)
Pharmacy called and stated the last refill for the Pts lancets and test strips were for 100 which is only a 90 day supply/ Pt needs refill for OneTouch Delica Lancets 30G MISC  AndONETOUCH ULTRA test strip Sent to  [facility] #[id number] - [place], [place] - [address] AT [facility] OF [address]  [address], [place] [address]  Phone:  [phone] Fax:  [phone]

## 2019-08-14 ENCOUNTER — Telehealth: Payer: Self-pay | Admitting: Family Medicine

## 2019-08-14 NOTE — Telephone Encounter (Unsigned)
Copied from CRM [id number]. Topic: General - Other >> Aug 14, 2019 11:44 AM Wyonia Hough E wrote: Reason for CRM: Pt would like to speak with dr. Yetta Barre Nurse about the spot on the bottom of his foot/ pt states it been painful / please advise

## 2019-08-14 NOTE — Telephone Encounter (Signed)
Scheduled patient for 10 AM Friday with Dr Yetta Barre. Pt informed.   CM

## 2019-08-16 ENCOUNTER — Other Ambulatory Visit: Payer: Self-pay

## 2019-08-16 ENCOUNTER — Ambulatory Visit (INDEPENDENT_AMBULATORY_CARE_PROVIDER_SITE_OTHER): Payer: PPO | Admitting: Family Medicine

## 2019-08-16 ENCOUNTER — Encounter: Payer: Self-pay | Admitting: Family Medicine

## 2019-08-16 VITALS — BP 126/82 | HR 68 | Ht 77.0 in | Wt 222.0 lb

## 2019-08-16 DIAGNOSIS — L089 Local infection of the skin and subcutaneous tissue, unspecified: Secondary | ICD-10-CM

## 2019-08-16 DIAGNOSIS — E1142 Type 2 diabetes mellitus with diabetic polyneuropathy: Secondary | ICD-10-CM

## 2019-08-16 DIAGNOSIS — E11628 Type 2 diabetes mellitus with other skin complications: Secondary | ICD-10-CM

## 2019-08-16 MED ORDER — AMOXICILLIN-POT CLAVULANATE 875-125 MG PO TABS: 1.0000 | ORAL_TABLET | Freq: Two times a day (BID) | ORAL | 0 refills | Status: DC

## 2019-08-16 NOTE — Progress Notes (Signed)
Date:  08/16/2019   Name:  Reginald Nielsen   DOB:  05/24/1960   MRN:  [id number]   Chief Complaint: spot on foot (X1-2 weeks ago noticed spot, left foot, painful, x2 months pain on both feet , not painful when touching , feels better when he stands up and put pressure on it, )  Patient is a 59 year old male who presents for a diabetic foot exam. The patient reports the following problems: spot on foot painful. Health maintenance has been reviewed up to date  Foot Injury  The incident occurred more than 1 week ago (2 months). Incident location: at beach. Injury mechanism: occur at beach. The pain is present in the left foot. The quality of the pain is described as aching. The pain has been constant since onset. Associated symptoms include an inability to bear weight. Pertinent negatives include no loss of motion or loss of sensation. Associated symptoms comments: tender. He reports no foreign bodies present. The symptoms are aggravated by movement, weight bearing and palpation. Treatments tried: topical. The treatment provided no relief.    Lab Results  Component Value Date   CREATININE 1.23 02/13/2019   BUN 15 02/13/2019   NA 140 02/13/2019   K 5.2 02/13/2019   CL 102 02/13/2019   CO2 23 02/13/2019   Lab Results  Component Value Date   CHOL 197 05/20/2019   HDL 43 05/20/2019   LDLCALC 121 (H) 05/20/2019   TRIG 188 (H) 05/20/2019   No results found for: TSH Lab Results  Component Value Date   HGBA1C 5.2 05/20/2019   No results found for: WBC, HGB, HCT, MCV, PLT No results found for: ALT, AST, GGT, ALKPHOS, BILITOT   Review of Systems  Constitutional: Negative for chills and fever.  HENT: Negative for drooling, ear discharge, ear pain and sore throat.   Respiratory: Negative for cough, shortness of breath and wheezing.   Cardiovascular: Negative for chest pain, palpitations and leg swelling.  Gastrointestinal: Negative for abdominal pain, blood in stool, constipation,  diarrhea and nausea.  Endocrine: Negative for polydipsia.  Genitourinary: Negative for dysuria, frequency, hematuria and urgency.  Musculoskeletal: Negative for back pain, myalgias and neck pain.  Skin: Negative for rash.  Allergic/Immunologic: Negative for environmental allergies.  Neurological: Negative for dizziness and headaches.  Hematological: Does not bruise/bleed easily.  Psychiatric/Behavioral: Negative for suicidal ideas. The patient is not nervous/anxious.     Patient Active Problem List   Diagnosis Date Noted  . Special screening for malignant neoplasms, colon     No Known Allergies  Past Surgical History:  Procedure Laterality Date  . COLONOSCOPY WITH PROPOFOL N/A 04/08/2019   Procedure: COLONOSCOPY WITH PROPOFOL;  Surgeon: Lucilla Lame, MD;  Location: [facility];  Service: Endoscopy;  Laterality: N/A;  . frozen shoulder    . SHOULDER SURGERY     2015 and 2016    Social History   Tobacco Use  . Smoking status: Never Smoker  . Smokeless tobacco: Current User    Types: Chew  Vaping Use  . Vaping Use: Never used  Substance Use Topics  . Alcohol use: Not Currently    Comment: quit 11/2017  . Drug use: Not Currently     Medication list has been reviewed and updated.  Current Meds  Medication Sig  . aspirin EC 81 MG tablet Take 81 mg by mouth daily.  . Blood Glucose Monitoring Suppl (ONE TOUCH ULTRA 2) w/Device KIT   . COMBIGAN 0.2-0.5 %  ophthalmic solution Apply 1 drop to eye 2 (two) times daily.  Marland Kitchen glipiZIDE (GLUCOTROL) 5 MG tablet Take 1 tablet (5 mg total) by mouth 2 (two) times daily before a meal.  . glucose blood (ONETOUCH ULTRA) test strip USE TO TEST BLOOD SUGAR ONCE DAILY  . IBUPROFEN PO Take by mouth as needed.  . latanoprost (XALATAN) 0.005 % ophthalmic solution   . metFORMIN (GLUCOPHAGE) 500 MG tablet Take 1 tablet (500 mg total) by mouth 2 (two) times daily with a meal.  . OneTouch Delica Lancets 27N MISC USE DAILY    PHQ 2/9  Scores 08/16/2019 05/22/2019 02/13/2019 12/31/2018  PHQ - 2 Score 0 1 0 0  PHQ- 9 Score 3 6 0 0    GAD 7 : Generalized Anxiety Score 08/16/2019 02/13/2019 12/31/2018  Nervous, Anxious, on Edge 1 0 1  Control/stop worrying 1 0 0  Worry too much - different things 1 0 0  Trouble relaxing 0 0 2  Restless 0 0 2  Easily annoyed or irritable 0 0 0  Afraid - awful might happen 1 0 0  Total GAD 7 Score 4 0 5  Anxiety Difficulty Not difficult at all - Not difficult at all    BP Readings from Last 3 Encounters:  08/16/19 126/82  05/20/19 122/72  04/08/19 105/77    Physical Exam Vitals and nursing note reviewed.  HENT:     Head: Normocephalic.     Right Ear: External ear normal.     Left Ear: External ear normal.     Nose: Nose normal.  Eyes:     General: No scleral icterus.       Right eye: No discharge.        Left eye: No discharge.     Conjunctiva/sclera: Conjunctivae normal.     Pupils: Pupils are equal, round, and reactive to light.  Neck:     Thyroid: No thyromegaly.     Vascular: No JVD.     Trachea: No tracheal deviation.  Cardiovascular:     Rate and Rhythm: Normal rate and regular rhythm.     Pulses:          Dorsalis pedis pulses are 2+ on the left side.       Posterior tibial pulses are 2+ on the left side.     Heart sounds: Normal heart sounds. No murmur heard.  No friction rub. No gallop.   Pulmonary:     Effort: No respiratory distress.     Breath sounds: Normal breath sounds. No wheezing, rhonchi or rales.  Abdominal:     General: Bowel sounds are normal.     Palpations: Abdomen is soft. There is no mass.     Tenderness: There is no abdominal tenderness. There is no guarding or rebound.  Musculoskeletal:        General: No tenderness. Normal range of motion.     Cervical back: Normal range of motion and neck supple.  Feet:     Left foot:     Skin integrity: Skin breakdown, erythema, warmth and callus present. No ulcer.  Lymphadenopathy:     Cervical: No  cervical adenopathy.  Skin:    General: Skin is warm.     Findings: No rash.  Neurological:     Mental Status: He is alert and oriented to person, place, and time.     Cranial Nerves: No cranial nerve deficit.     Deep Tendon Reflexes: Reflexes are normal and symmetric.  Wt Readings from Last 3 Encounters:  08/16/19 222 lb (100.7 kg)  05/20/19 228 lb (103.4 kg)  04/08/19 228 lb (103.4 kg)    BP 126/82   Pulse 68   Ht '6\' 5"'  (1.956 m)   Wt 222 lb (100.7 kg)   SpO2 100%   BMI 26.33 kg/m   Assessment and Plan:  1. Diabetic foot infection ([place]) New onset.  Persistent.  Complicated.  Patient is currently a diabetic that is being treated with Metformin and sulfonylurea.  Patient after going to the beach and developed a discomfort over the metatarsal head of the first joint.  There is tenderness here which is concerning for the possibility of a infection beneath the callus and that there is a possible puncture-like area noted on the questionable concern.  We will refer to podiatry for evaluation and patient has an appointment on Monday for that.  In the meantime we will initiate Augmentin 875 mg twice a day. - Ambulatory referral to Podiatry - amoxicillin-clavulanate (AUGMENTIN) 875-125 MG tablet; Take 1 tablet by mouth 2 (two) times daily.  Dispense: 20 tablet; Refill: 0 - Hemoglobin A1c  2. Type 2 diabetes mellitus with diabetic polyneuropathy, without long-term current use of insulin (HCC) Chronic.  Controlled.  Stable.  Patient is on combination of Metformin and glipizide 5 mg twice a day.  Last A1c was in the 5 range but has been as high as 8.  Currently considered this to be uncontrolled area but still patient is at risk given his diabetic underlying concern and condition of his foot.  Proceed with podiatry consult. - Ambulatory referral to Podiatry - amoxicillin-clavulanate (AUGMENTIN) 875-125 MG tablet; Take 1 tablet by mouth 2 (two) times daily.  Dispense: 20 tablet; Refill:  0 - Hemoglobin A1c

## 2019-08-17 LAB — HEMOGLOBIN A1C
Est. average glucose Bld gHb Est-mCnc: 105 mg/dL
Hgb A1c MFr Bld: 5.3 % (ref 4.8–5.6)

## 2019-08-19 DIAGNOSIS — L03116 Cellulitis of left lower limb: Secondary | ICD-10-CM | POA: Diagnosis not present

## 2019-08-19 DIAGNOSIS — L97522 Non-pressure chronic ulcer of other part of left foot with fat layer exposed: Secondary | ICD-10-CM | POA: Diagnosis not present

## 2019-08-19 DIAGNOSIS — E1140 Type 2 diabetes mellitus with diabetic neuropathy, unspecified: Secondary | ICD-10-CM | POA: Diagnosis not present

## 2019-08-21 DIAGNOSIS — H401112 Primary open-angle glaucoma, right eye, moderate stage: Secondary | ICD-10-CM | POA: Diagnosis not present

## 2019-08-23 ENCOUNTER — Other Ambulatory Visit (INDEPENDENT_AMBULATORY_CARE_PROVIDER_SITE_OTHER): Payer: Self-pay | Admitting: Vascular Surgery

## 2019-08-23 DIAGNOSIS — L97929 Non-pressure chronic ulcer of unspecified part of left lower leg with unspecified severity: Secondary | ICD-10-CM

## 2019-08-26 ENCOUNTER — Encounter (INDEPENDENT_AMBULATORY_CARE_PROVIDER_SITE_OTHER): Payer: Self-pay | Admitting: Vascular Surgery

## 2019-08-26 ENCOUNTER — Ambulatory Visit (INDEPENDENT_AMBULATORY_CARE_PROVIDER_SITE_OTHER): Payer: PPO

## 2019-08-26 ENCOUNTER — Ambulatory Visit (INDEPENDENT_AMBULATORY_CARE_PROVIDER_SITE_OTHER): Payer: PPO | Admitting: Vascular Surgery

## 2019-08-26 ENCOUNTER — Other Ambulatory Visit: Payer: Self-pay

## 2019-08-26 DIAGNOSIS — M8949 Other hypertrophic osteoarthropathy, multiple sites: Secondary | ICD-10-CM

## 2019-08-26 DIAGNOSIS — M1990 Unspecified osteoarthritis, unspecified site: Secondary | ICD-10-CM | POA: Insufficient documentation

## 2019-08-26 DIAGNOSIS — E1151 Type 2 diabetes mellitus with diabetic peripheral angiopathy without gangrene: Secondary | ICD-10-CM

## 2019-08-26 DIAGNOSIS — I7025 Atherosclerosis of native arteries of other extremities with ulceration: Secondary | ICD-10-CM

## 2019-08-26 DIAGNOSIS — E119 Type 2 diabetes mellitus without complications: Secondary | ICD-10-CM | POA: Insufficient documentation

## 2019-08-26 DIAGNOSIS — L97929 Non-pressure chronic ulcer of unspecified part of left lower leg with unspecified severity: Secondary | ICD-10-CM

## 2019-08-26 DIAGNOSIS — M159 Polyosteoarthritis, unspecified: Secondary | ICD-10-CM

## 2019-08-26 NOTE — Progress Notes (Signed)
° ° °MRN : [id number] ° °Reginald Nielsen is a 59 y.o. (02/02/1960) male who presents with chief complaint of  °Chief Complaint  °Patient presents with  °• New Patient (Initial Visit)  °  [place]. LLE ulcer and U/S   °. ° °History of Present Illness:  ° °The patient is seen for evaluation of painful lower extremities and diminished pulses associated with ulceration of the foot.  The patient notes the ulcer has been present for multiple weeks and has not been improving.  It is very painful and has had some drainage.  No specific history of trauma noted by the patient.  The patient denies fever or chills.  the patient does have diabetes which has been difficult to control. ° °Patient notes prior to the ulcer developing the extremities were painful particularly with ambulation or activity and the discomfort is very consistent day today. Typically, the pain occurs at less than one block, progress is as activity continues to the point that the patient must stop walking. Resting including standing still for several minutes allowed resumption of the activity and the ability to walk a similar distance before stopping again. Uneven terrain and inclined shorten the distance. The pain has been progressive over the past several years.  ° °The patient denies rest pain or dangling of an extremity off the side of the bed during the night for relief. °No prior interventions or surgeries. ° °No history of back problems or DJD of the lumbar sacral spine.  ° °The patient denies amaurosis fugax or recent TIA symptoms. There are no recent neurological changes noted. °The patient denies history of DVT, PE or superficial thrombophlebitis. °The patient denies recent episodes of angina or shortness of breath.  ° °ABI Rt=0.60 and Lt=0.82 ° °Current Meds  °Medication Sig  °• amoxicillin-clavulanate (AUGMENTIN) 875-125 MG tablet Take 1 tablet by mouth 2 (two) times daily.  °• aspirin EC 81 MG tablet Take 81 mg by mouth daily.  °• Blood Glucose  Monitoring Suppl (ONE TOUCH ULTRA 2) w/Device KIT   °• COMBIGAN 0.2-0.5 % ophthalmic solution Apply 1 drop to eye 2 (two) times daily.  °• glipiZIDE (GLUCOTROL) 5 MG tablet Take 1 tablet (5 mg total) by mouth 2 (two) times daily before a meal.  °• glucose blood (ONETOUCH ULTRA) test strip USE TO TEST BLOOD SUGAR ONCE DAILY  °• IBUPROFEN PO Take by mouth as needed.  °• latanoprost (XALATAN) 0.005 % ophthalmic solution   °• meloxicam (MOBIC) 15 MG tablet   °• metFORMIN (GLUCOPHAGE) 500 MG tablet Take 1 tablet (500 mg total) by mouth 2 (two) times daily with a meal.  °• OneTouch Delica Lancets 30G MISC USE DAILY  ° ° °Past Medical History:  °Diagnosis Date  °• Diabetes mellitus without complication (HCC)   ° type 2  °• Glaucoma   °• Hyperlipidemia   °• Hypertension   °• Shoulder pain, left   ° ° °Past Surgical History:  °Procedure Laterality Date  °• COLONOSCOPY WITH PROPOFOL N/A 04/08/2019  ° Procedure: COLONOSCOPY WITH PROPOFOL;  Surgeon: Wohl, Darren, MD;  Location: [facility];  Service: Endoscopy;  Laterality: N/A;  °• frozen shoulder    °• SHOULDER SURGERY    ° 2015 and 2016  ° ° °Social History °Social History  ° °Tobacco Use  °• Smoking status: Never Smoker  °• Smokeless tobacco: Current User  °  Types: Chew  °Vaping Use  °• Vaping Use: Never used  °Substance Use Topics  °• Alcohol use:   Not Currently  °  Comment: quit 11/2017  °• Drug use: Not Currently  ° ° °Family History °Family History  °Problem Relation Age of Onset  °• Diabetes Father   °• Cancer Father   °     lung  °• COPD Father   °• Heart attack Father   °• Stroke Father   °• Diabetes Paternal Aunt   °No family history of bleeding/clotting disorders, porphyria or autoimmune disease ° ° °No Known Allergies ° ° °REVIEW OF SYSTEMS (Negative unless checked) ° °Constitutional: []Weight loss  []Fever  []Chills °Cardiac: []Chest pain   []Chest pressure   []Palpitations   []Shortness of breath when laying flat   []Shortness of breath with  exertion. °Vascular:  []Pain in legs with walking   [x]Pain in legs at rest  []History of DVT   []Phlebitis   []Swelling in legs   []Varicose veins   [x]Non-healing ulcers °Pulmonary:   []Uses home oxygen   []Productive cough   []Hemoptysis   []Wheeze  []COPD   []Asthma °Neurologic:  []Dizziness   []Seizures   []History of stroke   []History of TIA  []Aphasia   []Vissual changes   []Weakness or numbness in arm   []Weakness or numbness in leg °Musculoskeletal:   []Joint swelling   []Joint pain   []Low back pain °Hematologic:  []Easy bruising  []Easy bleeding   []Hypercoagulable state   []Anemic °Gastrointestinal:  []Diarrhea   []Vomiting  [x]Gastroesophageal reflux/heartburn   []Difficulty swallowing. °Genitourinary:  []Chronic kidney disease   []Difficult urination  []Frequent urination   []Blood in urine °Skin:  []Rashes   [x]Ulcers  °Psychological:  []History of anxiety   [] History of major depression. ° °Physical Examination ° °Vitals:  ° 08/26/19 0823  °BP: 104/68  °Pulse: 61  °Weight: 220 lb (99.8 kg)  °Height: 6' 5" (1.956 m)  ° °Body mass index is 26.09 kg/m². °Gen: WD/WN, NAD °Head: [place]/AT, No temporalis wasting.  °Ear/Nose/Throat: Hearing grossly intact, nares w/o erythema or drainage, poor dentition °Eyes: PER, EOMI, sclera nonicteric.  °Neck: Supple, no masses.  No bruit or JVD.  °Pulmonary:  Good air movement, clear to auscultation bilaterally, no use of accessory muscles.  °Cardiac: RRR, normal S1, S2, no Murmurs. °Vascular: ulcer plantar surface left foot under the met head 1st ray °Vessel Right Left  °Radial Palpable Palpable  °PT Not Palpable Not Palpable  °DP Not Palpable Not Palpable  °Gastrointestinal: soft, non-distended. No guarding/no peritoneal signs.  °Musculoskeletal: M/S 5/5 throughout.  No deformity or atrophy.  °Neurologic: CN 2-12 intact. Pain and light touch intact in extremities.  Symmetrical.  Speech is fluent. Motor exam as listed above. °Psychiatric: Judgment intact, Mood & affect  appropriate for pt's clinical situation. °Dermatologic: No rashes + ulcers noted.  No changes consistent with cellulitis. ° ° °CBC °No results found for: WBC, HGB, HCT, MCV, PLT ° °BMET °   °Component Value Date/Time  ° NA 140 02/13/2019 0953  ° K 5.2 02/13/2019 0953  ° CL 102 02/13/2019 0953  ° CO2 23 02/13/2019 0953  ° GLUCOSE 125 (H) 02/13/2019 0953  ° BUN 15 02/13/2019 0953  ° CREATININE 1.23 02/13/2019 0953  ° CALCIUM 9.9 02/13/2019 0953  ° GFRNONAA 65 02/13/2019 0953  ° GFRAA 75 02/13/2019 0953  ° °CrCl cannot be calculated (Patient's most recent lab result is older than the maximum 21 days allowed.). ° °COAG °No results found for: INR, PROTIME ° °Radiology °No results found. ° °Assessment/Plan °1. Atherosclerosis of native   arteries of the extremities with ulceration (HCC) ° Recommend: ° °The patient has evidence of severe atherosclerotic changes of both lower extremities associated with ulceration and tissue loss of the left foot.  This represents a limb threatening ischemia and places the patient at the risk for left limb loss. ° °Patient should undergo angiography of the left lower extremities with the hope for intervention for limb salvage.  The risks and benefits as well as the alternative therapies was discussed in detail with the patient.  All questions were answered.  Patient agrees to proceed with left leg angiography. ° °The patient will follow up with me in the office after the procedure.  ° ° °2. Type 2 diabetes mellitus with diabetic peripheral angiopathy without gangrene, without long-term current use of insulin (HCC) °Continue hypoglycemic medications as already ordered, these medications have been reviewed and there are no changes at this time. ° °Hgb A1C to be monitored as already arranged by primary service ° ° °3. Primary osteoarthritis involving multiple joints °Continue NSAID medications as already ordered, these medications have been reviewed and there are no changes at this  time. ° °Continued activity and therapy was stressed. ° ° ° ° °Denzil Bristol, MD ° °08/26/2019 °6:15 PM ° ° °

## 2019-08-26 NOTE — H&P (View-Only) (Signed)
MRN : [id number]  Reginald Nielsen is a 59 y.o. (07-20-1960) male who presents with chief complaint of  Chief Complaint  Patient presents with   New Patient (Initial Visit)    Cleda Mccreedy. LLE ulcer and U/S   .  History of Present Illness:   The patient is seen for evaluation of painful lower extremities and diminished pulses associated with ulceration of the foot.  The patient notes the ulcer has been present for multiple weeks and has not been improving.  It is very painful and has had some drainage.  No specific history of trauma noted by the patient.  The patient denies fever or chills.  the patient does have diabetes which has been difficult to control.  Patient notes prior to the ulcer developing the extremities were painful particularly with ambulation or activity and the discomfort is very consistent day today. Typically, the pain occurs at less than one block, progress is as activity continues to the point that the patient must stop walking. Resting including standing still for several minutes allowed resumption of the activity and the ability to walk a similar distance before stopping again. Uneven terrain and inclined shorten the distance. The pain has been progressive over the past several years.   The patient denies rest pain or dangling of an extremity off the side of the bed during the night for relief. No prior interventions or surgeries.  No history of back problems or DJD of the lumbar sacral spine.   The patient denies amaurosis fugax or recent TIA symptoms. There are no recent neurological changes noted. The patient denies history of DVT, PE or superficial thrombophlebitis. The patient denies recent episodes of angina or shortness of breath.   ABI Rt=0.60 and Lt=0.82  Current Meds  Medication Sig   amoxicillin-clavulanate (AUGMENTIN) 875-125 MG tablet Take 1 tablet by mouth 2 (two) times daily.   aspirin EC 81 MG tablet Take 81 mg by mouth daily.   Blood Glucose  Monitoring Suppl (ONE TOUCH ULTRA 2) w/Device KIT    COMBIGAN 0.2-0.5 % ophthalmic solution Apply 1 drop to eye 2 (two) times daily.   glipiZIDE (GLUCOTROL) 5 MG tablet Take 1 tablet (5 mg total) by mouth 2 (two) times daily before a meal.   glucose blood (ONETOUCH ULTRA) test strip USE TO TEST BLOOD SUGAR ONCE DAILY   IBUPROFEN PO Take by mouth as needed.   latanoprost (XALATAN) 0.005 % ophthalmic solution    meloxicam (MOBIC) 15 MG tablet    metFORMIN (GLUCOPHAGE) 500 MG tablet Take 1 tablet (500 mg total) by mouth 2 (two) times daily with a meal.   OneTouch Delica Lancets 81J MISC USE DAILY    Past Medical History:  Diagnosis Date   Diabetes mellitus without complication ([place])    type 2   Glaucoma    Hyperlipidemia    Hypertension    Shoulder pain, left     Past Surgical History:  Procedure Laterality Date   COLONOSCOPY WITH PROPOFOL N/A 04/08/2019   Procedure: COLONOSCOPY WITH PROPOFOL;  Surgeon: Lucilla Lame, MD;  Location: [facility] [facility];  Service: Endoscopy;  Laterality: N/A;   frozen shoulder     SHOULDER SURGERY     2015 and 2016    Social History Social History   Tobacco Use   Smoking status: Never Smoker   Smokeless tobacco: Current User    Types: Chew  Vaping Use   Vaping Use: Never used  Substance Use Topics   Alcohol use:  Not Currently    Comment: quit 11/2017   Drug use: Not Currently    Family History Family History  Problem Relation Age of Onset   Diabetes Father    Cancer Father        lung   COPD Father    Heart attack Father    Stroke Father    Diabetes Paternal Aunt   No family history of bleeding/clotting disorders, porphyria or autoimmune disease   No Known Allergies   REVIEW OF SYSTEMS (Negative unless checked)  Constitutional: _0 Weight loss  _1 Fever  _2 Chills Cardiac: _3 Chest pain   _4 Chest pressure   _5 Palpitations   _6 Shortness of breath when laying flat   _7 Shortness of breath with  exertion. Vascular:  _8 Pain in legs with walking   _9 Pain in legs at rest  _10 History of DVT   _11 Phlebitis   _12 Swelling in legs   _13 Varicose veins   _14 Non-healing ulcers Pulmonary:   _15 Uses home oxygen   _16 Productive cough   _17 Hemoptysis   _18 Wheeze  _19 COPD   _20 Asthma Neurologic:  _21 Dizziness   _22 Seizures   _23 History of stroke   _24 History of TIA  _25 Aphasia   _26 Vissual changes   _27 Weakness or numbness in arm   _28 Weakness or numbness in leg Musculoskeletal:   _29 Joint swelling   _30 Joint pain   _31 Low back pain Hematologic:  _32 Easy bruising  _33 Easy bleeding   _34 Hypercoagulable state   _35 Anemic Gastrointestinal:  _36 Diarrhea   _37 Vomiting  _38 Gastroesophageal reflux/heartburn   _39 Difficulty swallowing. Genitourinary:  _40 Chronic kidney disease   _41 Difficult urination  _42 Frequent urination   _43 Blood in urine Skin:  _44 Rashes   _45 Ulcers  Psychological:  _46 History of anxiety   _47  History of major depression.  Physical Examination  Vitals:   08/26/19 0823  BP: 104/68  Pulse: 61  Weight: 220 lb (99.8 kg)  Height: _48  (1.956 m)   Body mass index is 26.09 kg/m. Gen: WD/WN, NAD Head: [place]/AT, No temporalis wasting.  Ear/Nose/Throat: Hearing grossly intact, nares w/o erythema or drainage, poor dentition Eyes: PER, EOMI, sclera nonicteric.  Neck: Supple, no masses.  No bruit or JVD.  Pulmonary:  Good air movement, clear to auscultation bilaterally, no use of accessory muscles.  Cardiac: RRR, normal S1, S2, no Murmurs. Vascular: ulcer plantar surface left foot under the met head 1st ray Vessel Right Left  Radial Palpable Palpable  PT Not Palpable Not Palpable  DP Not Palpable Not Palpable  Gastrointestinal: soft, non-distended. No guarding/no peritoneal signs.  Musculoskeletal: M/S 5/5 throughout.  No deformity or atrophy.  Neurologic: CN 2-12 intact. Pain and light touch intact in extremities.  Symmetrical.  Speech is fluent. Motor exam as listed above. Psychiatric: Judgment intact, Mood & affect  appropriate for pt's clinical situation. Dermatologic: No rashes + ulcers noted.  No changes consistent with cellulitis.   CBC No results found for: WBC, HGB, HCT, MCV, PLT  BMET    Component Value Date/Time   NA 140 02/13/2019 0953   K 5.2 02/13/2019 0953   CL 102 02/13/2019 0953   CO2 23 02/13/2019 0953   GLUCOSE 125 (H) 02/13/2019 0953   BUN 15 02/13/2019 0953   CREATININE 1.23 02/13/2019 0953   CALCIUM 9.9 02/13/2019 0953   GFRNONAA 65 02/13/2019 0953   GFRAA 75 02/13/2019 0953   CrCl cannot be calculated (Patient's most recent lab result is older than the maximum 21 days allowed.).  COAG No results found for: INR, PROTIME  Radiology No results found.  Assessment/Plan 1. Atherosclerosis of native  arteries of the extremities with ulceration (HCC)  Recommend:  The patient has evidence of severe atherosclerotic changes of both lower extremities associated with ulceration and tissue loss of the left foot.  This represents a limb threatening ischemia and places the patient at the risk for left limb loss.  Patient should undergo angiography of the left lower extremities with the hope for intervention for limb salvage.  The risks and benefits as well as the alternative therapies was discussed in detail with the patient.  All questions were answered.  Patient agrees to proceed with left leg angiography.  The patient will follow up with me in the office after the procedure.    2. Type 2 diabetes mellitus with diabetic peripheral angiopathy without gangrene, without long-term current use of insulin ([place]) Continue hypoglycemic medications as already ordered, these medications have been reviewed and there are no changes at this time.  Hgb A1C to be monitored as already arranged by primary service   3. Primary osteoarthritis involving multiple joints Continue NSAID medications as already ordered, these medications have been reviewed and there are no changes at this  time.  Continued activity and therapy was stressed.     Hortencia Pilar, MD  08/26/2019 6:15 PM

## 2019-08-27 ENCOUNTER — Telehealth (INDEPENDENT_AMBULATORY_CARE_PROVIDER_SITE_OTHER): Payer: Self-pay

## 2019-08-27 NOTE — Telephone Encounter (Signed)
Spoke with the patient and he is scheduled with Dr. Gilda Crease for a left leg angio on 09/03/19 with a 9:00 am arrival time to the [facility]. Covid testing on 08/30/19 between 8-1 pm at the [facility]. Pre-procedure instructions were discussed and will be mailed.

## 2019-08-30 ENCOUNTER — Other Ambulatory Visit (INDEPENDENT_AMBULATORY_CARE_PROVIDER_SITE_OTHER): Payer: Self-pay | Admitting: Nurse Practitioner

## 2019-08-30 ENCOUNTER — Other Ambulatory Visit
Admission: RE | Admit: 2019-08-30 | Discharge: 2019-08-30 | Disposition: A | Discharge status: Home / Self Care | Payer: PPO | Source: Ambulatory Visit | Attending: Vascular Surgery | Admitting: Vascular Surgery

## 2019-08-30 ENCOUNTER — Other Ambulatory Visit: Payer: Self-pay

## 2019-08-30 DIAGNOSIS — Z01812 Encounter for preprocedural laboratory examination: Secondary | ICD-10-CM | POA: Insufficient documentation

## 2019-08-30 DIAGNOSIS — Z20822 Contact with and (suspected) exposure to covid-19: Secondary | ICD-10-CM | POA: Diagnosis not present

## 2019-08-31 LAB — SARS CORONAVIRUS 2 (TAT 6-24 HRS): SARS Coronavirus 2: NEGATIVE

## 2019-09-02 ENCOUNTER — Encounter (INDEPENDENT_AMBULATORY_CARE_PROVIDER_SITE_OTHER): Payer: Self-pay | Admitting: Vascular Surgery

## 2019-09-03 ENCOUNTER — Ambulatory Visit
Admission: RE | Admit: 2019-09-03 | Discharge: 2019-09-03 | Disposition: A | Discharge status: Home / Self Care | Payer: PPO | Attending: Vascular Surgery | Admitting: Vascular Surgery

## 2019-09-03 ENCOUNTER — Other Ambulatory Visit: Payer: Self-pay

## 2019-09-03 ENCOUNTER — Encounter: Payer: Self-pay | Admitting: Vascular Surgery

## 2019-09-03 ENCOUNTER — Encounter
Admission: RE | Disposition: A | Discharge status: Home / Self Care | Payer: Self-pay | Source: Home / Self Care | Attending: Vascular Surgery

## 2019-09-03 DIAGNOSIS — I70299 Other atherosclerosis of native arteries of extremities, unspecified extremity: Secondary | ICD-10-CM

## 2019-09-03 DIAGNOSIS — I10 Essential (primary) hypertension: Secondary | ICD-10-CM | POA: Insufficient documentation

## 2019-09-03 DIAGNOSIS — I70245 Atherosclerosis of native arteries of left leg with ulceration of other part of foot: Secondary | ICD-10-CM | POA: Diagnosis not present

## 2019-09-03 DIAGNOSIS — E1142 Type 2 diabetes mellitus with diabetic polyneuropathy: Secondary | ICD-10-CM | POA: Insufficient documentation

## 2019-09-03 DIAGNOSIS — Z791 Long term (current) use of non-steroidal anti-inflammatories (NSAID): Secondary | ICD-10-CM | POA: Diagnosis not present

## 2019-09-03 DIAGNOSIS — L97909 Non-pressure chronic ulcer of unspecified part of unspecified lower leg with unspecified severity: Secondary | ICD-10-CM

## 2019-09-03 DIAGNOSIS — Z7982 Long term (current) use of aspirin: Secondary | ICD-10-CM | POA: Insufficient documentation

## 2019-09-03 DIAGNOSIS — Z7984 Long term (current) use of oral hypoglycemic drugs: Secondary | ICD-10-CM | POA: Diagnosis not present

## 2019-09-03 DIAGNOSIS — M1990 Unspecified osteoarthritis, unspecified site: Secondary | ICD-10-CM | POA: Insufficient documentation

## 2019-09-03 DIAGNOSIS — Z8249 Family history of ischemic heart disease and other diseases of the circulatory system: Secondary | ICD-10-CM | POA: Diagnosis not present

## 2019-09-03 DIAGNOSIS — L97529 Non-pressure chronic ulcer of other part of left foot with unspecified severity: Secondary | ICD-10-CM | POA: Insufficient documentation

## 2019-09-03 DIAGNOSIS — E11621 Type 2 diabetes mellitus with foot ulcer: Secondary | ICD-10-CM | POA: Diagnosis not present

## 2019-09-03 DIAGNOSIS — E1136 Type 2 diabetes mellitus with diabetic cataract: Secondary | ICD-10-CM | POA: Diagnosis not present

## 2019-09-03 DIAGNOSIS — E1151 Type 2 diabetes mellitus with diabetic peripheral angiopathy without gangrene: Secondary | ICD-10-CM | POA: Insufficient documentation

## 2019-09-03 DIAGNOSIS — E785 Hyperlipidemia, unspecified: Secondary | ICD-10-CM | POA: Diagnosis not present

## 2019-09-03 DIAGNOSIS — I70249 Atherosclerosis of native arteries of left leg with ulceration of unspecified site: Secondary | ICD-10-CM

## 2019-09-03 DIAGNOSIS — Z833 Family history of diabetes mellitus: Secondary | ICD-10-CM | POA: Diagnosis not present

## 2019-09-03 HISTORY — PX: LOWER EXTREMITY ANGIOGRAPHY: CATH118251

## 2019-09-03 LAB — CREATININE, SERUM
Creatinine, Ser: 0.95 mg/dL (ref 0.61–1.24)
GFR calc Af Amer: >60 mL/min (ref >60)
GFR calc non Af Amer: >60 mL/min (ref >60)

## 2019-09-03 LAB — GLUCOSE, CAPILLARY
Glucose-Capillary: 93 mg/dL (ref 70–99)
Glucose-Capillary: 82 mg/dL (ref 70–99)

## 2019-09-03 LAB — BUN: BUN: 25 mg/dL — ABNORMAL HIGH (ref 6–20)

## 2019-09-03 SURGERY — LOWER EXTREMITY ANGIOGRAPHY
Anesthesia: Moderate Sedation | Laterality: Left

## 2019-09-03 MED ORDER — ONDANSETRON HCL 4 MG/2ML IJ SOLN: 4.0000 mg | Freq: Four times a day (QID) | INTRAMUSCULAR | Status: DC | PRN

## 2019-09-03 MED ORDER — SODIUM CHLORIDE 0.9 % IV SOLN: 250.0000 mL | INTRAVENOUS | Status: DC | PRN

## 2019-09-03 MED ORDER — FAMOTIDINE 20 MG PO TABS: 40.0000 mg | ORAL_TABLET | Freq: Once | ORAL | Status: DC | PRN

## 2019-09-03 MED ORDER — HEPARIN SODIUM (PORCINE) 1000 UNIT/ML IJ SOLN
INTRAMUSCULAR | Status: DC | PRN
  Administered 2019-09-03: 5000 [IU] via INTRAVENOUS

## 2019-09-03 MED ORDER — FENTANYL CITRATE (PF) 100 MCG/2ML IJ SOLN
INTRAMUSCULAR | Status: AC
  Filled 2019-09-03: qty 2

## 2019-09-03 MED ORDER — METHYLPREDNISOLONE SODIUM SUCC 125 MG IJ SOLR: 125.0000 mg | Freq: Once | INTRAMUSCULAR | Status: DC | PRN

## 2019-09-03 MED ORDER — MORPHINE SULFATE (PF) 4 MG/ML IV SOLN: 2.0000 mg | INTRAVENOUS | Status: DC | PRN

## 2019-09-03 MED ORDER — DIPHENHYDRAMINE HCL 50 MG/ML IJ SOLN: 50.0000 mg | Freq: Once | INTRAMUSCULAR | Status: DC | PRN

## 2019-09-03 MED ORDER — CLOPIDOGREL BISULFATE 300 MG PO TABS
300.0000 mg | ORAL_TABLET | Freq: Once | ORAL | Status: AC
  Administered 2019-09-03: 300 mg via ORAL

## 2019-09-03 MED ORDER — OXYCODONE HCL 5 MG PO TABS: 5.0000 mg | ORAL_TABLET | ORAL | Status: DC | PRN

## 2019-09-03 MED ORDER — MIDAZOLAM HCL 2 MG/2ML IJ SOLN
INTRAMUSCULAR | Status: AC
  Filled 2019-09-03: qty 2

## 2019-09-03 MED ORDER — MIDAZOLAM HCL 2 MG/ML PO SYRP: 8.0000 mg | ORAL_SOLUTION | Freq: Once | ORAL | Status: DC | PRN

## 2019-09-03 MED ORDER — HEPARIN SODIUM (PORCINE) 1000 UNIT/ML IJ SOLN
INTRAMUSCULAR | Status: AC
  Filled 2019-09-03: qty 1

## 2019-09-03 MED ORDER — CLOPIDOGREL BISULFATE 75 MG PO TABS: 75.0000 mg | ORAL_TABLET | Freq: Every day | ORAL | 4 refills | Status: DC

## 2019-09-03 MED ORDER — SODIUM CHLORIDE 0.9% FLUSH: 3.0000 mL | Freq: Two times a day (BID) | INTRAVENOUS | Status: DC

## 2019-09-03 MED ORDER — SODIUM CHLORIDE 0.9% FLUSH: 3.0000 mL | INTRAVENOUS | Status: DC | PRN

## 2019-09-03 MED ORDER — ATORVASTATIN CALCIUM 10 MG PO TABS: 10.0000 mg | ORAL_TABLET | Freq: Every day | ORAL | 6 refills | Status: DC

## 2019-09-03 MED ORDER — IODIXANOL 320 MG/ML IV SOLN
INTRAVENOUS | Status: DC | PRN
  Administered 2019-09-03: 70 mL via INTRA_ARTERIAL

## 2019-09-03 MED ORDER — HYDROMORPHONE HCL 1 MG/ML IJ SOLN: 1.0000 mg | Freq: Once | INTRAMUSCULAR | Status: DC | PRN

## 2019-09-03 MED ORDER — CEFAZOLIN SODIUM-DEXTROSE 2-4 GM/100ML-% IV SOLN
2.0000 g | Freq: Once | INTRAVENOUS | Status: AC
  Administered 2019-09-03: 2 g via INTRAVENOUS

## 2019-09-03 MED ORDER — SODIUM CHLORIDE 0.9 % IV SOLN: INTRAVENOUS | Status: DC

## 2019-09-03 MED ORDER — HYDRALAZINE HCL 20 MG/ML IJ SOLN: 5.0000 mg | INTRAMUSCULAR | Status: DC | PRN

## 2019-09-03 MED ORDER — MIDAZOLAM HCL 2 MG/2ML IJ SOLN
INTRAMUSCULAR | Status: DC | PRN
  Administered 2019-09-03: 1 mg via INTRAVENOUS
  Administered 2019-09-03 (×2): 2 mg via INTRAVENOUS

## 2019-09-03 MED ORDER — LABETALOL HCL 5 MG/ML IV SOLN: 10.0000 mg | INTRAVENOUS | Status: DC | PRN

## 2019-09-03 MED ORDER — FENTANYL CITRATE (PF) 100 MCG/2ML IJ SOLN
INTRAMUSCULAR | Status: DC | PRN
Start: 2019-09-03 — End: 2019-09-03
  Administered 2019-09-03 (×2): 50 ug via INTRAVENOUS
  Administered 2019-09-03: 25 ug via INTRAVENOUS

## 2019-09-03 MED ORDER — MIDAZOLAM HCL 5 MG/5ML IJ SOLN
INTRAMUSCULAR | Status: AC
  Filled 2019-09-03: qty 5

## 2019-09-03 MED ORDER — ACETAMINOPHEN 325 MG PO TABS: 650.0000 mg | ORAL_TABLET | ORAL | Status: DC | PRN

## 2019-09-03 SURGICAL SUPPLY — 25 items
BALLN ULTRVRSE 2.5X300X150 (BALLOONS) ×3
BALLN ULTRVRSE 2X150X150 (BALLOONS) ×3
BALLN ULTRVRSE 2X150X150 OTW (BALLOONS) ×1
BALLN ULTRVRSE 3X80X150 (BALLOONS) ×3
BALLN ULTRVRSE 3X80X150 OTW (BALLOONS) ×1
BALLOON ULTRVRSE 2.5X300X150 (BALLOONS) IMPLANT
BALLOON ULTRVRSE 2X150X150 OTW (BALLOONS) IMPLANT
BALLOON ULTRVRSE 3X80X150 OTW (BALLOONS) IMPLANT
CATH CROSS S6 106CM (CATHETERS) ×2 IMPLANT
CATH CROSSER S6 154CM (CATHETERS) ×2 IMPLANT
CATH PIG 70CM (CATHETERS) ×2 IMPLANT
CATH USHER TPER 130CM (CATHETERS) ×2 IMPLANT
CATH VERT 5FR 125CM (CATHETERS) ×2 IMPLANT
DEVICE PRESTO INFLATION (MISCELLANEOUS) ×2 IMPLANT
DEVICE STARCLOSE SE CLOSURE (Vascular Products) ×2 IMPLANT
GLIDEWIRE ADV .035X260CM (WIRE) ×2 IMPLANT
KIT FLOWMATE PROCEDURAL (KITS) ×2 IMPLANT
PACK ANGIOGRAPHY (CUSTOM PROCEDURE TRAY) ×3 IMPLANT
SET INTRO CAPELLA COAXIAL (SET/KITS/TRAYS/PACK) ×2 IMPLANT
SHEATH BRITE TIP 5FRX11 (SHEATH) ×2 IMPLANT
SHEATH RAABE 7FR (SHEATH) ×2 IMPLANT
SYR MEDRAD MARK 7 150ML (SYRINGE) ×2 IMPLANT
TUBING CONTRAST HIGH PRESS 72 (TUBING) ×2 IMPLANT
WIRE G V18X300CM (WIRE) ×2 IMPLANT
WIRE J 3MM .035X145CM (WIRE) ×2 IMPLANT

## 2019-09-03 NOTE — Op Note (Signed)
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 Introduction catheter into left lower extremity 3rd order catheter placement  2. Contrast injection left lower extremity for distal runoff with additional 3rd order  3. Percutaneous transluminal angioplasty with crosser atherectomy left anterior tibial              4. Percutaneous transluminal angioplasty left peroneal  5. Star close closure right common femoral arteriotomy  Anesthesia: Conscious sedation was administered under my direct supervision by the interventional radiology RN. IV Versed plus fentanyl were utilized. Continuous ECG, pulse oximetry and blood pressure was monitored throughout the entire procedure.  Conscious sedation was for a total of 1 hour 17 minutes.  Sheath: 7 French Raby right common femoral retrograde  Contrast: 70 cc  Fluoroscopy Time: 10.0 minutes  Indications: Reginald Nielsen presents with increasing pain of the left lower extremity.  He has developed ulceration of the left lower extremity this suggests the patient is having limb threatening ischemia. The risks and benefits are reviewed all questions answered patient agrees to proceed.  Procedure:Reginald Nielsen is a 59 y.o. y.o. male who was identified and appropriate procedural time out was performed. The patient was then placed supine on the table and prepped and draped in the usual sterile fashion.   Ultrasound was placed in the sterile sleeve and the right groin was evaluated the right common femoral artery was echolucent and pulsatile indicating patency. Image was recorded for the permanent record  and under real-time visualization a microneedle was inserted into the common femoral artery followed by the microwire and then the micro-sheath. A J-wire was then advanced through the micro-sheath and a 5 [place] sheath was then inserted over a J-wire. J-wire was then advanced and a 5 French pigtail catheter was positioned at the level of T12.  AP projection of the aorta was then obtained. Pigtail catheter was repositioned to above the bifurcation and a RAO view of the pelvis was obtained. Subsequently a big tail catheter with the stiff angle Glidewire was used to cross the aortic bifurcation the catheter wire were advanced down into the left distal external iliac artery. Oblique view of the femoral bifurcation was then obtained and subsequently the wire was reintroduced and the pigtail catheter negotiated into the SFA representing third order catheter placement. Distal runoff was then performed.  Diagnostic interpretation: The abdominal aorta is opacified with a bolus injection contrast.  There is no evidence of hemodynamically significant stenosis or atherosclerotic changes.  The renal arteries are single and widely patent bilaterally.  The aortic bifurcation is widely patent.  Common external and internal iliac arteries are widely patent bilaterally.  The left common femoral profunda femoris and superficial femoral artery as well as the popliteal artery demonstrate mild atherosclerotic changes but there are no hemodynamically significant lesions noted.  Trifurcation is severely diseased with a greater than 70% stenosis at the origin of the anterior tibial.  Tibioperoneal trunk is patent.  All 3 tibial vessels demonstrate severe disease with occlusion of the anterior tibial as well as the posterior tibial in their mid and distal thirds.  There is a greater than 90% stenosis in the peroneal and its midportion.  Dorsalis pedis is reconstituted at the level of the ankle and is widely patent filling the pedal  arch.  5000 units of heparin was then given and allowed to circulate and a 7 [place] Raby sheath was advanced up and over the bifurcation and positioned in the femoral artery  Hand injection contrast demonstrated the tibial anatomy in detail.  2 mm balloon was used to angioplasty the anterior tibial proximally.  The inflation was for 1 minute at 12 atm.  Next the Usher catheter was advanced down to the mid anterior tibial.  The S6 Crosser atherectomy catheter was then advanced through the occlusion of the anterior tibial so the tip is down to the reconstitution of the dorsalis pedis.  Follow-up imaging through the Usher catheter now demonstrates patency of the anterior tibial with greater than 50% residual stenosis and therefore the wire was reintroduced and a 2.5 mm x 30 cm Ultraverse balloon is advanced from the dorsalis pedis up through the origin of the anterior tibial.  Inflation is to 14 atm for 1 minute.  Follow-up imaging demonstrated excellent patency of the anterior tibial with less than 10% residual stenosis and preservation of the distal runoff.  The detector was then repositioned and the distal popliteal was re-imaged.   The Kumpe catheter and V 18 wire were then negotiated into the peroneal and then down to the distal bifurcation of the peroneal artery.  The previously used 2 mm x 15 cm Ultraverse balloon was then advanced across the 90% stenosis in the midportion of the peroneal inflated to 12 atm for 1 minute.  Follow-up imaging demonstrates wide patency with less than 10% residual stenosis.  Distal runoff was then reassessed and noted to be widely patent.   After review of these images the sheath is pulled into the right external iliac oblique of the common femoral is obtained and a Star close device deployed. There no immediate Complications.  Findings:  The abdominal aorta is opacified with a bolus injection contrast.  There is no evidence of hemodynamically significant stenosis or  atherosclerotic changes.  The renal arteries are single and widely patent bilaterally.  The aortic bifurcation is widely patent.  Common external and internal iliac arteries are widely patent bilaterally.  The left common femoral profunda femoris and superficial femoral artery as well as the popliteal artery demonstrate mild atherosclerotic changes but there are no hemodynamically significant lesions noted.  Trifurcation is severely diseased with a greater than 70% stenosis at the origin of the anterior tibial.  Tibioperoneal trunk is patent.  All 3 tibial vessels demonstrate severe disease with occlusion of the anterior tibial as well as the posterior tibial in their mid and distal thirds.  There is a greater than 90% stenosis in the peroneal and its midportion.  Dorsalis pedis is reconstituted at the level of the ankle and is widely patent filling the pedal arch.  Following successful atherectomy there is now reconstitution of the left anterior tibial but significant residual stenosis and therefore a 2.5 mm angioplasty is performed with excellent result and less than 10% residual stenosis.  Following angioplasty of the left peroneal there is now wide patency with less than 10% residual stenosis.  Summary: Successful recanalization left lower extremity for limb salvage   Disposition: Patient was taken to the recovery room in stable condition having tolerated the procedure well.  Belenda Cruise Reginald Nielsen 09/03/2019,11:36 AM

## 2019-09-03 NOTE — Interval H&P Note (Signed)
History and Physical Interval Note:  09/03/2019 9:57 AM  Reginald Nielsen  has presented today for surgery, with the diagnosis of LLE Angiography   ASO with ulceration   BARD Rep   cc: M Godley, S Willey Pt to have Covid test on 08-30-19.  The various methods of treatment have been discussed with the patient and family. After consideration of risks, benefits and other options for treatment, the patient has consented to  Procedure(s): LOWER EXTREMITY ANGIOGRAPHY (Left) as a surgical intervention.  The patient's history has been reviewed, patient examined, no change in status, stable for surgery.  I have reviewed the patient's chart and labs.  Questions were answered to the patient's satisfaction.     Levora Dredge

## 2019-09-03 NOTE — Discharge Instructions (Signed)
Femoral Site Care This sheet gives you information about how to care for yourself after your procedure. Your health care provider may also give you more specific instructions. If you have problems or questions, contact your health care provider. What can I expect after the procedure? After the procedure, it is common to have:  Bruising that usually fades within 1-2 weeks.  Tenderness at the site. Follow these instructions at home: Wound care  Follow instructions from your health care provider about how to take care of your insertion site. Make sure you: ? Wash your hands with soap and water before you change your bandage (dressing). If soap and water are not available, use hand sanitizer. ? Change your dressing as told by your health care provider. ? Leave stitches (sutures), skin glue, or adhesive strips in place. These skin closures may need to stay in place for 2 weeks or longer. If adhesive strip edges start to loosen and curl up, you may trim the loose edges. Do not remove adhesive strips completely unless your health care provider tells you to do that.  Do not take baths, swim, or use a hot tub until your health care provider approves.  You may shower 24-48 hours after the procedure or as told by your health care provider. ? Gently wash the site with plain soap and water. ? Pat the area dry with a clean towel. ? Do not rub the site. This may cause bleeding.  Do not apply powder or lotion to the site. Keep the site clean and dry.  Check your femoral site every day for signs of infection. Check for: ? Redness, swelling, or pain. ? Fluid or blood. ? Warmth. ? Pus or a bad smell. Activity  For the first 2-3 days after your procedure, or as long as directed: ? Avoid climbing stairs as much as possible. ? Do not squat.  Do not lift anything that is heavier than 10 lb (4.5 kg), or the limit that you are told, until your health care provider says that it is safe.  Rest as  directed. ? Avoid sitting for a long time without moving. Get up to take short walks every 1-2 hours.  Do not drive for 24 hours if you were given a medicine to help you relax (sedative). General instructions  Take over-the-counter and prescription medicines only as told by your health care provider.  Keep all follow-up visits as told by your health care provider. This is important. Contact a health care provider if you have:  A fever or chills.  You have redness, swelling, or pain around your insertion site. Get help right away if:  The catheter insertion area swells very fast.  You pass out.  You suddenly start to sweat or your skin gets clammy.  The catheter insertion area is bleeding, and the bleeding does not stop when you hold steady pressure on the area.  The area near or just beyond the catheter insertion site becomes pale, cool, tingly, or numb. These symptoms may represent a serious problem that is an emergency. Do not wait to see if the symptoms will go away. Get medical help right away. Call your local emergency services (911 in the [place] Do not drive yourself to the hospital. Summary  After the procedure, it is common to have bruising that usually fades within 1-2 weeks.  Check your femoral site every day for signs of infection.  Do not lift anything that is heavier than 10 lb (4.5 kg), or the   limit that you are told, until your health care provider says that it is safe. This information is not intended to replace advice given to you by your health care provider. Make sure you discuss any questions you have with your health care provider. Document Revised: 01/09/2017 Document Reviewed: 01/09/2017 Elsevier Patient Education  2020 [employer] Angiogram, Care After This sheet gives you information about how to care for yourself after your procedure. Your doctor may also give you more specific instructions. If you have problems or questions, contact your  doctor. Follow these instructions at home: Insertion site care  Follow instructions from your doctor about how to take care of your long, thin tube (catheter) insertion area. Make sure you: ? Wash your hands with soap and water before you change your bandage (dressing). If you cannot use soap and water, use hand sanitizer. ? Change your bandage as told by your doctor. ? Leave stitches (sutures), skin glue, or skin tape (adhesive) strips in place. They may need to stay in place for 2 weeks or longer. If tape strips get loose and curl up, you may trim the loose edges. Do not remove tape strips completely unless your doctor says it is okay.  Do not take baths, swim, or use a hot tub until your doctor says it is okay.  You may shower 24-48 hours after the procedure or as told by your doctor. ? Gently wash the area with plain soap and water. ? Pat the area dry with a clean towel. ? Do not rub the area. This may cause bleeding.  Do not apply powder or lotion to the area. Keep the area clean and dry.  Check your insertion area every day for signs of infection. Check for: ? More redness, swelling, or pain. ? Fluid or blood. ? Warmth. ? Pus or a bad smell. Activity  Rest as told by your doctor, usually for 1-2 days.  Do not lift anything that is heavier than 10 lbs. (4.5 kg) or as told by your doctor.  Do not drive for 24 hours if you were given a medicine to help you relax (sedative).  Do not drive or use heavy machinery while taking prescription pain medicine. General instructions   Go back to your normal activities as told by your doctor, usually in about a week. Ask your doctor what activities are safe for you.  If the insertion area starts to bleed, lie flat and put pressure on the area. If the bleeding does not stop, get help right away. This is an emergency.  Drink enough fluid to keep your pee (urine) clear or pale yellow.  Take over-the-counter and prescription medicines only  as told by your doctor.  Keep all follow-up visits as told by your doctor. This is important. Contact a doctor if:  You have a fever.  You have chills.  You have more redness, swelling, or pain around your insertion area.  You have fluid or blood coming from your insertion area.  The insertion area feels warm to the touch.  You have pus or a bad smell coming from your insertion area.  You have more bruising around the insertion area.  Blood collects in the tissue around the insertion area (hematoma) that may be painful to the touch. Get help right away if:  You have a lot of pain in the insertion area.  The insertion area swells very fast.  The insertion area is bleeding, and the bleeding does not stop after holding steady   pressure on the area.  The area near or just beyond the insertion area becomes pale, cool, tingly, or numb. These symptoms may be an emergency. Do not wait to see if the symptoms will go away. Get medical help right away. Call your local emergency services (911 in the [place] Do not drive yourself to the hospital. Summary  After the procedure, it is common to have bruising and tenderness at the long, thin tube insertion area.  After the procedure, it is important to rest and drink plenty of fluids.  Do not take baths, swim, or use a hot tub until your doctor says it is okay to do so. You may shower 24-48 hours after the procedure or as told by your doctor.  If the insertion area starts to bleed, lie flat and put pressure on the area. If the bleeding does not stop, get help right away. This is an emergency. This information is not intended to replace advice given to you by your health care provider. Make sure you discuss any questions you have with your health care provider. Document Revised: 12/09/2016 Document Reviewed: 12/22/2015 Elsevier Patient Education  2020 Elsevier Inc. Moderate Conscious Sedation, Adult, Care After These instructions provide you  with information about caring for yourself after your procedure. Your health care provider may also give you more specific instructions. Your treatment has been planned according to current medical practices, but problems sometimes occur. Call your health care provider if you have any problems or questions after your procedure. What can I expect after the procedure? After your procedure, it is common:  To feel sleepy for several hours.  To feel clumsy and have poor balance for several hours.  To have poor judgment for several hours.  To vomit if you eat too soon. Follow these instructions at home: For at least 24 hours after the procedure:   Do not: ? Participate in activities where you could fall or become injured. ? Drive. ? Use heavy machinery. ? Drink alcohol. ? Take sleeping pills or medicines that cause drowsiness. ? Make important decisions or sign legal documents. ? Take care of children on your own.  Rest. Eating and drinking  Follow the diet recommended by your health care provider.  If you vomit: ? Drink water, juice, or soup when you can drink without vomiting. ? Make sure you have little or no nausea before eating solid foods. General instructions  Have a responsible adult stay with you until you are awake and alert.  Take over-the-counter and prescription medicines only as told by your health care provider.  If you smoke, do not smoke without supervision.  Keep all follow-up visits as told by your health care provider. This is important. Contact a health care provider if:  You keep feeling nauseous or you keep vomiting.  You feel light-headed.  You develop a rash.  You have a fever. Get help right away if:  You have trouble breathing. This information is not intended to replace advice given to you by your health care provider. Make sure you discuss any questions you have with your health care provider. Document Revised: 12/09/2016 Document Reviewed:  04/18/2015 Elsevier Patient Education  2020 Elsevier Inc.  

## 2019-09-04 ENCOUNTER — Encounter: Payer: Self-pay | Admitting: Vascular Surgery

## 2019-09-11 LAB — HM DIABETES EYE EXAM

## 2019-09-26 ENCOUNTER — Other Ambulatory Visit (INDEPENDENT_AMBULATORY_CARE_PROVIDER_SITE_OTHER): Payer: Self-pay | Admitting: Vascular Surgery

## 2019-09-26 DIAGNOSIS — I70249 Atherosclerosis of native arteries of left leg with ulceration of unspecified site: Secondary | ICD-10-CM

## 2019-09-26 DIAGNOSIS — Z9862 Peripheral vascular angioplasty status: Secondary | ICD-10-CM

## 2019-09-29 ENCOUNTER — Other Ambulatory Visit: Payer: Self-pay | Admitting: Family Medicine

## 2019-09-29 NOTE — Telephone Encounter (Signed)
Requested Prescriptions  Pending Prescriptions Disp Refills  . Lancets (ONETOUCH DELICA PLUS LANCET30G) MISC [Pharmacy Med Name: ONE TOUCH DELICA PLUS 30G LANCETS] 100 each 0    Sig: USE DAILY     Endocrinology: Diabetes - Testing Supplies Passed - 09/29/2019 11:10 AM      Passed - Valid encounter within last 12 months    Recent Outpatient Visits          1 month ago Diabetic foot infection (HCC)   [facility] Duanne Limerick, MD   4 months ago Type 2 diabetes mellitus with diabetic polyneuropathy, without long-term current use of insulin (HCC)   [facility] Duanne Limerick, MD   7 months ago Annual physical exam   [facility] Duanne Limerick, MD   9 months ago Establishing care with new doctor, encounter for   [facility] Duanne Limerick, MD      Future Appointments            In 1 month Duanne Limerick, MD [facility]           . ONETOUCH ULTRA test strip [employer] Med Name: ONE TOUCH ULTRA BLUE TESTST(NEW)100] 100 strip 0    Sig: USE TO TEST BLOOD SUGAR ONCE DAILY     Endocrinology: Diabetes - Testing Supplies Passed - 09/29/2019 11:10 AM      Passed - Valid encounter within last 12 months    Recent Outpatient Visits          1 month ago Diabetic foot infection (HCC)   [facility] Duanne Limerick, MD   4 months ago Type 2 diabetes mellitus with diabetic polyneuropathy, without long-term current use of insulin (HCC)   [facility] Duanne Limerick, MD   7 months ago Annual physical exam   [facility] Duanne Limerick, MD   9 months ago Establishing care with new doctor, encounter for   [facility] Duanne Limerick, MD      Future Appointments            In 1 month Duanne Limerick, MD [facility], [facility]

## 2019-09-30 ENCOUNTER — Ambulatory Visit (INDEPENDENT_AMBULATORY_CARE_PROVIDER_SITE_OTHER): Payer: PPO | Admitting: Vascular Surgery

## 2019-09-30 ENCOUNTER — Encounter (INDEPENDENT_AMBULATORY_CARE_PROVIDER_SITE_OTHER): Payer: PPO

## 2019-10-09 ENCOUNTER — Other Ambulatory Visit: Payer: Self-pay

## 2019-10-09 ENCOUNTER — Encounter (INDEPENDENT_AMBULATORY_CARE_PROVIDER_SITE_OTHER): Payer: Self-pay | Admitting: Nurse Practitioner

## 2019-10-09 ENCOUNTER — Ambulatory Visit (INDEPENDENT_AMBULATORY_CARE_PROVIDER_SITE_OTHER): Payer: PPO | Admitting: Nurse Practitioner

## 2019-10-09 ENCOUNTER — Ambulatory Visit (INDEPENDENT_AMBULATORY_CARE_PROVIDER_SITE_OTHER): Payer: PPO

## 2019-10-09 VITALS — BP 131/83 | HR 65 | Resp 16 | Wt 221.4 lb

## 2019-10-09 DIAGNOSIS — Z9862 Peripheral vascular angioplasty status: Secondary | ICD-10-CM

## 2019-10-09 DIAGNOSIS — M8949 Other hypertrophic osteoarthropathy, multiple sites: Secondary | ICD-10-CM | POA: Diagnosis not present

## 2019-10-09 DIAGNOSIS — E1151 Type 2 diabetes mellitus with diabetic peripheral angiopathy without gangrene: Secondary | ICD-10-CM

## 2019-10-09 DIAGNOSIS — M159 Polyosteoarthritis, unspecified: Secondary | ICD-10-CM

## 2019-10-09 DIAGNOSIS — I7025 Atherosclerosis of native arteries of other extremities with ulceration: Secondary | ICD-10-CM | POA: Diagnosis not present

## 2019-10-09 DIAGNOSIS — I70249 Atherosclerosis of native arteries of left leg with ulceration of unspecified site: Secondary | ICD-10-CM | POA: Diagnosis not present

## 2019-10-09 NOTE — Progress Notes (Signed)
Subjective:    Patient ID: Reginald Nielsen, male    DOB: October 28, 1960, 59 y.o.   MRN: [id number] Chief Complaint  Patient presents with  . Follow-up    [facility] 3wk post le angio    The patient returns to the office for followup and review status post angiogram with intervention. The patient notes improvement in the lower extremity symptoms. No interval shortening of the patient's claudication distance or rest pain symptoms. Previous wounds have now healed.  No new ulcers or wounds have occurred since the last visit.  There have been no significant changes to the patient's overall health care.  The patient denies amaurosis fugax or recent TIA symptoms. There are no recent neurological changes noted. The patient denies history of DVT, PE or superficial thrombophlebitis. The patient denies recent episodes of angina or shortness of breath.   ABI's Rt=0.77 and Lt=1.35  (previous ABI's Rt=0.60 and Lt=0.82) Duplex US of the biphasic tibial artery waveforms bilaterally with good toe waveforms bilaterally.   Review of Systems  Skin: Positive for wound.  All other systems reviewed and are negative.      Objective:   Physical Exam Vitals reviewed.  HENT:     Head: Normocephalic.  Cardiovascular:     Rate and Rhythm: Normal rate and regular rhythm.     Pulses: Normal pulses.  Pulmonary:     Effort: Pulmonary effort is normal.  Skin:    General: Skin is warm and dry.  Neurological:     Mental Status: He is alert and oriented to person, place, and time.  Psychiatric:        Mood and Affect: Mood normal.        Behavior: Behavior normal.        Thought Content: Thought content normal.        Judgment: Judgment normal.     BP 131/83 (BP Location: Right Arm)   Pulse 65   Resp 16   Wt 221 lb 6.4 oz (100.4 kg)   BMI 26.25 kg/m   Past Medical History:  Diagnosis Date  . Diabetes mellitus without complication ([address])    type 2  . Glaucoma   . Hyperlipidemia   . Hypertension   .  Shoulder pain, left     Social History   Socioeconomic History  . Marital status: Married    Spouse name: Not on file  . Number of children: 1  . Years of education: Not on file  . Highest education level: High school graduate  Occupational History  . Occupation: retired    Comment: truck Geophysicist/field seismologist  Tobacco Use  . Smoking status: Never Smoker  . Smokeless tobacco: Current User    Types: Chew  Vaping Use  . Vaping Use: Never used  Substance and Sexual Activity  . Alcohol use: Not Currently    Comment: quit 11/2017  . Drug use: Not Currently  . Sexual activity: Not Currently  Other Topics Concern  . Not on file  Social History Narrative  . Not on file   Social Determinants of Health   Financial Resource Strain: Low Risk   . Difficulty of Paying Living Expenses: Not very hard  Food Insecurity: No Food Insecurity  . Worried About Charity fundraiser in the Last Year: Never true  . Ran Out of Food in the Last Year: Never true  Transportation Needs: No Transportation Needs  . Lack of Transportation (Medical): No  . Lack of Transportation (Non-Medical): No  Physical Activity:  Insufficiently Active  . Days of Exercise per Week: 4 days  . Minutes of Exercise per Session: 30 min  Stress: No Stress Concern Present  . Feeling of Stress : Only a little  Social Connections: Unknown  . Frequency of Communication with Friends and Family: Patient refused  . Frequency of Social Gatherings with Friends and Family: Patient refused  . Attends Religious Services: Patient refused  . Active Member of Clubs or Organizations: Patient refused  . Attends Archivist Meetings: Patient refused  . Marital Status: Married  Human resources officer Violence: Not At Risk  . Fear of Current or Ex-Partner: No  . Emotionally Abused: No  . Physically Abused: No  . Sexually Abused: No    Past Surgical History:  Procedure Laterality Date  . COLONOSCOPY WITH PROPOFOL N/A 04/08/2019   Procedure:  COLONOSCOPY WITH PROPOFOL;  Surgeon: Lucilla Lame, MD;  Location: [place];  Service: Endoscopy;  Laterality: N/A;  . frozen shoulder    . LOWER EXTREMITY ANGIOGRAPHY Left 09/03/2019   Procedure: LOWER EXTREMITY ANGIOGRAPHY;  Surgeon: Katha Cabal, MD;  Location: [place] [facility];  Service: Cardiovascular;  Laterality: Left;  . SHOULDER SURGERY     2015 and 2016    Family History  Problem Relation Age of Onset  . Diabetes Father   . Cancer Father        lung  . COPD Father   . Heart attack Father   . Stroke Father   . Diabetes Paternal Aunt     No Known Allergies     Assessment & Plan:   1. Atherosclerosis of native arteries of the extremities with ulceration ([place])  Recommend:  The patient has evidence of atherosclerosis of the lower extremities with claudication.  The patient does not voice lifestyle limiting changes at this point in time.  Noninvasive studies do not suggest clinically significant change.  No invasive studies, angiography or surgery at this time The patient should continue walking and begin a more formal exercise program.  The patient should continue antiplatelet therapy and aggressive treatment of the lipid abnormalities  No changes in the patient's medications at this time  The patient should continue wearing graduated compression socks 10-15 mmHg strength to control the mild edema.    2. Type 2 diabetes mellitus with diabetic peripheral angiopathy without gangrene, without long-term current use of insulin ([place]) Continue hypoglycemic medications as already ordered, these medications have been reviewed and there are no changes at this time.  Hgb A1C to be monitored as already arranged by primary service   3. Primary osteoarthritis involving multiple joints Continue NSAID medications as already ordered, these medications have been reviewed and there are no changes at this time.  Continued activity and therapy was  stressed.    Current Outpatient Medications on File Prior to Visit  Medication Sig Dispense Refill  . aspirin EC 81 MG tablet Take 81 mg by mouth daily.    Marland Kitchen atorvastatin (LIPITOR) 10 MG tablet Take 1 tablet (10 mg total) by mouth daily. Additional refills per the patient's primary care 30 tablet 6  . Blood Glucose Monitoring Suppl (ONE TOUCH ULTRA 2) w/Device KIT     . clopidogrel (PLAVIX) 75 MG tablet Take 1 tablet (75 mg total) by mouth daily. 30 tablet 4  . COMBIGAN 0.2-0.5 % ophthalmic solution Place 1 drop into both eyes 2 (two) times daily.     . dorzolamide-timolol (COSOPT) 22.3-6.8 MG/ML ophthalmic solution     . glipiZIDE (  GLUCOTROL) 5 MG tablet Take 1 tablet (5 mg total) by mouth 2 (two) times daily before a meal. 180 tablet 1  . ibuprofen (ADVIL) 200 MG tablet Take 400 mg by mouth every 6 (six) hours as needed for moderate pain.     . Lancets (ONETOUCH DELICA PLUS [id number]) MISC USE DAILY 100 each 0  . latanoprost (XALATAN) 0.005 % ophthalmic solution Place 1 drop into both eyes at bedtime.     . metFORMIN (GLUCOPHAGE) 500 MG tablet Take 1 tablet (500 mg total) by mouth 2 (two) times daily with a meal. 180 tablet 1  . ONETOUCH ULTRA test strip USE TO TEST BLOOD SUGAR ONCE DAILY 100 strip 0  . amoxicillin-clavulanate (AUGMENTIN) 875-125 MG tablet Take 1 tablet by mouth 2 (two) times daily. (Patient not taking: Reported on 08/29/2019) 20 tablet 0  . meloxicam (MOBIC) 15 MG tablet  (Patient not taking: Reported on 08/29/2019)     No current facility-administered medications on file prior to visit.    There are no Patient Instructions on file for this visit. No follow-ups on file.   Kris Hartmann, NP

## 2019-10-28 DIAGNOSIS — I739 Peripheral vascular disease, unspecified: Secondary | ICD-10-CM | POA: Diagnosis not present

## 2019-10-28 DIAGNOSIS — E1140 Type 2 diabetes mellitus with diabetic neuropathy, unspecified: Secondary | ICD-10-CM | POA: Diagnosis not present

## 2019-10-28 DIAGNOSIS — L97511 Non-pressure chronic ulcer of other part of right foot limited to breakdown of skin: Secondary | ICD-10-CM | POA: Diagnosis not present

## 2019-10-28 DIAGNOSIS — L97522 Non-pressure chronic ulcer of other part of left foot with fat layer exposed: Secondary | ICD-10-CM | POA: Diagnosis not present

## 2019-11-01 ENCOUNTER — Encounter: Payer: Self-pay | Admitting: Emergency Medicine

## 2019-11-01 ENCOUNTER — Ambulatory Visit (INDEPENDENT_AMBULATORY_CARE_PROVIDER_SITE_OTHER): Payer: PPO | Admitting: Family Medicine

## 2019-11-01 ENCOUNTER — Other Ambulatory Visit: Payer: Self-pay

## 2019-11-01 ENCOUNTER — Inpatient Hospital Stay
Admission: EM | Admit: 2019-11-01 | Discharge: 2019-11-09 | DRG: 253 | Disposition: A | Discharge status: Home Health | Payer: PPO | Source: Ambulatory Visit | Attending: Internal Medicine | Admitting: Internal Medicine

## 2019-11-01 ENCOUNTER — Emergency Department: Payer: PPO

## 2019-11-01 ENCOUNTER — Encounter: Payer: Self-pay | Admitting: Family Medicine

## 2019-11-01 VITALS — BP 110/60 | HR 72 | Ht 77.0 in | Wt 220.0 lb

## 2019-11-01 DIAGNOSIS — M86172 Other acute osteomyelitis, left ankle and foot: Secondary | ICD-10-CM

## 2019-11-01 DIAGNOSIS — L089 Local infection of the skin and subcutaneous tissue, unspecified: Secondary | ICD-10-CM | POA: Diagnosis present

## 2019-11-01 DIAGNOSIS — L02612 Cutaneous abscess of left foot: Secondary | ICD-10-CM

## 2019-11-01 DIAGNOSIS — Z79899 Other long term (current) drug therapy: Secondary | ICD-10-CM

## 2019-11-01 DIAGNOSIS — I70244 Atherosclerosis of native arteries of left leg with ulceration of heel and midfoot: Secondary | ICD-10-CM | POA: Diagnosis present

## 2019-11-01 DIAGNOSIS — I10 Essential (primary) hypertension: Secondary | ICD-10-CM | POA: Diagnosis present

## 2019-11-01 DIAGNOSIS — H409 Unspecified glaucoma: Secondary | ICD-10-CM | POA: Diagnosis present

## 2019-11-01 DIAGNOSIS — L97529 Non-pressure chronic ulcer of other part of left foot with unspecified severity: Secondary | ICD-10-CM | POA: Diagnosis not present

## 2019-11-01 DIAGNOSIS — E1140 Type 2 diabetes mellitus with diabetic neuropathy, unspecified: Secondary | ICD-10-CM | POA: Diagnosis present

## 2019-11-01 DIAGNOSIS — E1152 Type 2 diabetes mellitus with diabetic peripheral angiopathy with gangrene: Secondary | ICD-10-CM | POA: Diagnosis present

## 2019-11-01 DIAGNOSIS — R600 Localized edema: Secondary | ICD-10-CM | POA: Diagnosis not present

## 2019-11-01 DIAGNOSIS — Z7902 Long term (current) use of antithrombotics/antiplatelets: Secondary | ICD-10-CM | POA: Diagnosis not present

## 2019-11-01 DIAGNOSIS — T300 Burn of unspecified body region, unspecified degree: Secondary | ICD-10-CM

## 2019-11-01 DIAGNOSIS — L97423 Non-pressure chronic ulcer of left heel and midfoot with necrosis of muscle: Secondary | ICD-10-CM | POA: Diagnosis present

## 2019-11-01 DIAGNOSIS — B9561 Methicillin susceptible Staphylococcus aureus infection as the cause of diseases classified elsewhere: Secondary | ICD-10-CM | POA: Diagnosis present

## 2019-11-01 DIAGNOSIS — Z8249 Family history of ischemic heart disease and other diseases of the circulatory system: Secondary | ICD-10-CM | POA: Diagnosis not present

## 2019-11-01 DIAGNOSIS — L03116 Cellulitis of left lower limb: Secondary | ICD-10-CM | POA: Diagnosis present

## 2019-11-01 DIAGNOSIS — L97523 Non-pressure chronic ulcer of other part of left foot with necrosis of muscle: Secondary | ICD-10-CM | POA: Diagnosis not present

## 2019-11-01 DIAGNOSIS — I739 Peripheral vascular disease, unspecified: Secondary | ICD-10-CM

## 2019-11-01 DIAGNOSIS — Z20822 Contact with and (suspected) exposure to covid-19: Secondary | ICD-10-CM | POA: Diagnosis present

## 2019-11-01 DIAGNOSIS — I70262 Atherosclerosis of native arteries of extremities with gangrene, left leg: Secondary | ICD-10-CM | POA: Diagnosis not present

## 2019-11-01 DIAGNOSIS — E1142 Type 2 diabetes mellitus with diabetic polyneuropathy: Secondary | ICD-10-CM | POA: Diagnosis not present

## 2019-11-01 DIAGNOSIS — I70201 Unspecified atherosclerosis of native arteries of extremities, right leg: Secondary | ICD-10-CM | POA: Diagnosis present

## 2019-11-01 DIAGNOSIS — Z7984 Long term (current) use of oral hypoglycemic drugs: Secondary | ICD-10-CM

## 2019-11-01 DIAGNOSIS — M722 Plantar fascial fibromatosis: Secondary | ICD-10-CM | POA: Diagnosis present

## 2019-11-01 DIAGNOSIS — E11628 Type 2 diabetes mellitus with other skin complications: Secondary | ICD-10-CM | POA: Diagnosis not present

## 2019-11-01 DIAGNOSIS — F17220 Nicotine dependence, chewing tobacco, uncomplicated: Secondary | ICD-10-CM | POA: Diagnosis present

## 2019-11-01 DIAGNOSIS — Z7982 Long term (current) use of aspirin: Secondary | ICD-10-CM

## 2019-11-01 DIAGNOSIS — E1151 Type 2 diabetes mellitus with diabetic peripheral angiopathy without gangrene: Secondary | ICD-10-CM | POA: Diagnosis present

## 2019-11-01 DIAGNOSIS — M869 Osteomyelitis, unspecified: Secondary | ICD-10-CM | POA: Diagnosis present

## 2019-11-01 DIAGNOSIS — I70212 Atherosclerosis of native arteries of extremities with intermittent claudication, left leg: Secondary | ICD-10-CM | POA: Diagnosis present

## 2019-11-01 DIAGNOSIS — Z833 Family history of diabetes mellitus: Secondary | ICD-10-CM

## 2019-11-01 DIAGNOSIS — E11621 Type 2 diabetes mellitus with foot ulcer: Secondary | ICD-10-CM | POA: Diagnosis present

## 2019-11-01 DIAGNOSIS — E785 Hyperlipidemia, unspecified: Secondary | ICD-10-CM | POA: Diagnosis present

## 2019-11-01 DIAGNOSIS — Z823 Family history of stroke: Secondary | ICD-10-CM

## 2019-11-01 DIAGNOSIS — S91302A Unspecified open wound, left foot, initial encounter: Secondary | ICD-10-CM | POA: Diagnosis not present

## 2019-11-01 DIAGNOSIS — Z801 Family history of malignant neoplasm of trachea, bronchus and lung: Secondary | ICD-10-CM

## 2019-11-01 DIAGNOSIS — I96 Gangrene, not elsewhere classified: Secondary | ICD-10-CM | POA: Diagnosis present

## 2019-11-01 DIAGNOSIS — E1169 Type 2 diabetes mellitus with other specified complication: Secondary | ICD-10-CM | POA: Diagnosis present

## 2019-11-01 DIAGNOSIS — M8618 Other acute osteomyelitis, other site: Secondary | ICD-10-CM | POA: Diagnosis not present

## 2019-11-01 DIAGNOSIS — Z825 Family history of asthma and other chronic lower respiratory diseases: Secondary | ICD-10-CM

## 2019-11-01 LAB — SURGICAL PCR SCREEN
MRSA, PCR: NEGATIVE
Staphylococcus aureus: NEGATIVE

## 2019-11-01 LAB — CBC WITH DIFFERENTIAL/PLATELET
Abs Immature Granulocytes: 0.04 10*3/uL (ref 0.00–0.07)
Basophils Absolute: 0.1 10*3/uL (ref 0.0–0.1)
Basophils Relative: 1 %
Eosinophils Absolute: 0.1 10*3/uL (ref 0.0–0.5)
Eosinophils Relative: 1 %
HCT: 44.7 % (ref 39.0–52.0)
Hemoglobin: 14.7 g/dL (ref 13.0–17.0)
Immature Granulocytes: 0 %
Lymphocytes Relative: 16 %
Lymphs Abs: 1.7 10*3/uL (ref 0.7–4.0)
MCH: 27.9 pg (ref 26.0–34.0)
MCHC: 32.9 g/dL (ref 30.0–36.0)
MCV: 84.8 fL (ref 80.0–100.0)
Monocytes Absolute: 0.9 10*3/uL (ref 0.1–1.0)
Monocytes Relative: 8 %
Neutro Abs: 7.7 10*3/uL (ref 1.7–7.7)
Neutrophils Relative %: 74 %
Platelets: 206 10*3/uL (ref 150–400)
RBC: 5.27 MIL/uL (ref 4.22–5.81)
RDW: 13 % (ref 11.5–15.5)
WBC: 10.4 10*3/uL (ref 4.0–10.5)
nRBC: 0 % (ref 0.0–0.2)

## 2019-11-01 LAB — PROTIME-INR
INR: 1.1 (ref 0.8–1.2)
Prothrombin Time: 14 seconds (ref 11.4–15.2)

## 2019-11-01 LAB — LACTIC ACID, PLASMA: Lactic Acid, Venous: 1.1 mmol/L (ref 0.5–1.9)

## 2019-11-01 LAB — C-REACTIVE PROTEIN: CRP: 15.4 mg/dL — ABNORMAL HIGH (ref <1.0)

## 2019-11-01 LAB — HIV ANTIBODY (ROUTINE TESTING W REFLEX): HIV Screen 4th Generation wRfx: NONREACTIVE

## 2019-11-01 LAB — COMPREHENSIVE METABOLIC PANEL
ALT: 14 U/L (ref 0–44)
AST: 13 U/L — ABNORMAL LOW (ref 15–41)
Albumin: 3.8 g/dL (ref 3.5–5.0)
Alkaline Phosphatase: 40 U/L (ref 38–126)
Anion gap: 11 (ref 5–15)
BUN: 23 mg/dL — ABNORMAL HIGH (ref 6–20)
CO2: 25 mmol/L (ref 22–32)
Calcium: 9.4 mg/dL (ref 8.9–10.3)
Chloride: 99 mmol/L (ref 98–111)
Creatinine, Ser: 0.92 mg/dL (ref 0.61–1.24)
GFR, Estimated: >60 mL/min (ref >60)
Glucose, Bld: 140 mg/dL — ABNORMAL HIGH (ref 70–99)
Potassium: 4.2 mmol/L (ref 3.5–5.1)
Sodium: 135 mmol/L (ref 135–145)
Total Bilirubin: 0.9 mg/dL (ref 0.3–1.2)
Total Protein: 8.1 g/dL (ref 6.5–8.1)

## 2019-11-01 LAB — APTT: aPTT: 37 seconds — ABNORMAL HIGH (ref 24–36)

## 2019-11-01 LAB — SEDIMENTATION RATE: Sed Rate: 57 mm/hr — ABNORMAL HIGH (ref 0–20)

## 2019-11-01 LAB — GLUCOSE, CAPILLARY
Glucose-Capillary: 105 mg/dL — ABNORMAL HIGH (ref 70–99)
Glucose-Capillary: 102 mg/dL — ABNORMAL HIGH (ref 70–99)
Glucose-Capillary: 143 mg/dL — ABNORMAL HIGH (ref 70–99)

## 2019-11-01 LAB — RESPIRATORY PANEL BY RT PCR (FLU A&B, COVID)
Influenza A by PCR: NEGATIVE
Influenza B by PCR: NEGATIVE
SARS Coronavirus 2 by RT PCR: NEGATIVE

## 2019-11-01 MED ORDER — BRIMONIDINE TARTRATE 0.2 % OP SOLN
1.0000 [drp] | Freq: Two times a day (BID) | OPHTHALMIC | Status: DC
  Administered 2019-11-01 – 2019-11-09 (×16): 1 [drp] via OPHTHALMIC
  Filled 2019-11-01: qty 5

## 2019-11-01 MED ORDER — HYDROMORPHONE HCL 1 MG/ML IJ SOLN
1.0000 mg | Freq: Once | INTRAMUSCULAR | Status: AC
  Administered 2019-11-01: 1 mg via INTRAVENOUS
  Filled 2019-11-01: qty 1

## 2019-11-01 MED ORDER — INSULIN ASPART 100 UNIT/ML ~~LOC~~ SOLN
0.0000 [IU] | Freq: Every day | SUBCUTANEOUS | Status: DC
  Administered 2019-11-02: 2 [IU] via SUBCUTANEOUS
  Administered 2019-11-04: 1 [IU] via SUBCUTANEOUS
  Administered 2019-11-05: 2 [IU] via SUBCUTANEOUS
  Filled 2019-11-01 (×3): qty 1

## 2019-11-01 MED ORDER — VANCOMYCIN HCL 2000 MG/400ML IV SOLN
2000.0000 mg | Freq: Once | INTRAVENOUS | Status: DC
  Filled 2019-11-01: qty 400

## 2019-11-01 MED ORDER — PNEUMOCOCCAL VAC POLYVALENT 25 MCG/0.5ML IJ INJ
0.5000 mL | INJECTION | INTRAMUSCULAR | Status: DC
  Filled 2019-11-01: qty 0.5

## 2019-11-01 MED ORDER — ASPIRIN EC 81 MG PO TBEC
81.0000 mg | DELAYED_RELEASE_TABLET | Freq: Every day | ORAL | Status: DC
  Administered 2019-11-01 – 2019-11-09 (×7): 81 mg via ORAL
  Filled 2019-11-01 (×7): qty 1

## 2019-11-01 MED ORDER — SODIUM CHLORIDE 0.9 % IV SOLN
1.0000 g | INTRAVENOUS | Status: DC
  Administered 2019-11-01 – 2019-11-04 (×4): 1 g via INTRAVENOUS
  Filled 2019-11-01 (×2): qty 10
  Filled 2019-11-01: qty 1
  Filled 2019-11-01: qty 10
  Filled 2019-11-01: qty 1

## 2019-11-01 MED ORDER — BRIMONIDINE TARTRATE-TIMOLOL 0.2-0.5 % OP SOLN
1.0000 [drp] | Freq: Two times a day (BID) | OPHTHALMIC | Status: DC
  Filled 2019-11-01 (×2): qty 5

## 2019-11-01 MED ORDER — TIMOLOL MALEATE 0.5 % OP SOLN
1.0000 [drp] | Freq: Two times a day (BID) | OPHTHALMIC | Status: DC
  Administered 2019-11-01 – 2019-11-09 (×16): 1 [drp] via OPHTHALMIC
  Filled 2019-11-01: qty 5

## 2019-11-01 MED ORDER — PIPERACILLIN-TAZOBACTAM 3.375 G IVPB 30 MIN
3.3750 g | Freq: Once | INTRAVENOUS | Status: DC
  Filled 2019-11-01: qty 50

## 2019-11-01 MED ORDER — MUPIROCIN 2 % EX OINT
1.0000 "application " | TOPICAL_OINTMENT | Freq: Two times a day (BID) | CUTANEOUS | Status: DC
  Filled 2019-11-01: qty 22

## 2019-11-01 MED ORDER — ATORVASTATIN CALCIUM 10 MG PO TABS
10.0000 mg | ORAL_TABLET | Freq: Every day | ORAL | Status: DC
  Administered 2019-11-01 – 2019-11-09 (×7): 10 mg via ORAL
  Filled 2019-11-01 (×7): qty 1

## 2019-11-01 MED ORDER — INSULIN ASPART 100 UNIT/ML ~~LOC~~ SOLN
0.0000 [IU] | Freq: Three times a day (TID) | SUBCUTANEOUS | Status: DC
  Administered 2019-11-02: 1 [IU] via SUBCUTANEOUS
  Administered 2019-11-02: 5 [IU] via SUBCUTANEOUS
  Administered 2019-11-03 (×2): 2 [IU] via SUBCUTANEOUS
  Administered 2019-11-04 (×2): 1 [IU] via SUBCUTANEOUS
  Administered 2019-11-07: 2 [IU] via SUBCUTANEOUS
  Administered 2019-11-07: 1 [IU] via SUBCUTANEOUS
  Administered 2019-11-08: 2 [IU] via SUBCUTANEOUS
  Filled 2019-11-01 (×9): qty 1

## 2019-11-01 MED ORDER — INFLUENZA VAC SPLIT QUAD 0.5 ML IM SUSY
0.5000 mL | PREFILLED_SYRINGE | INTRAMUSCULAR | Status: DC
  Filled 2019-11-01: qty 0.5

## 2019-11-01 MED ORDER — ONDANSETRON HCL 4 MG/2ML IJ SOLN: 4.0000 mg | Freq: Three times a day (TID) | INTRAMUSCULAR | Status: DC | PRN

## 2019-11-01 MED ORDER — ACETAMINOPHEN 325 MG PO TABS: 650.0000 mg | ORAL_TABLET | Freq: Four times a day (QID) | ORAL | Status: DC | PRN

## 2019-11-01 MED ORDER — OXYCODONE-ACETAMINOPHEN 5-325 MG PO TABS
1.0000 | ORAL_TABLET | ORAL | Status: DC | PRN
  Administered 2019-11-01 – 2019-11-02 (×3): 1 via ORAL
  Filled 2019-11-01 (×3): qty 1

## 2019-11-01 MED ORDER — LATANOPROST 0.005 % OP SOLN
1.0000 [drp] | Freq: Every day | OPHTHALMIC | Status: DC
  Administered 2019-11-01 – 2019-11-08 (×8): 1 [drp] via OPHTHALMIC
  Filled 2019-11-01 (×2): qty 2.5

## 2019-11-01 MED ORDER — HYDRALAZINE HCL 20 MG/ML IJ SOLN: 5.0000 mg | INTRAMUSCULAR | Status: DC | PRN

## 2019-11-01 MED ORDER — MORPHINE SULFATE (PF) 2 MG/ML IV SOLN
2.0000 mg | INTRAVENOUS | Status: DC | PRN
  Administered 2019-11-01 – 2019-11-09 (×7): 2 mg via INTRAVENOUS
  Filled 2019-11-01 (×7): qty 1

## 2019-11-01 NOTE — ED Provider Notes (Signed)
[facility] Emergency Department Provider Note    First MD Initiated Contact with Patient 11/01/19 1239     (approximate)  I have reviewed the triage vital signs and the nursing notes.   HISTORY  Chief Complaint Wound Infection    HPI Reginald Nielsen is a 59 y.o. male below listed past medical history presents to the ER from outpatient clinic due to worsening diabetic foot wound concern for cellulitis.  Was seen in podiatry clinic.  Not felt to be consistent with abscess but given cellulitis diabetes and reported history of PAD sent to the ER for admission for IV antibiotics.  Patient denies any fevers.  Is having mild to moderate pain.  Denies any new injuries.  Wound has been getting worse over the past 2448 hrs.  Was given a round of Augmentin previously.  Not currently on any antibiotics.    Past Medical History:  Diagnosis Date  . Diabetes mellitus without complication ([place])    type 2  . Glaucoma   . Hyperlipidemia   . Hypertension   . Shoulder pain, left    Family History  Problem Relation Age of Onset  . Diabetes Father   . Cancer Father        lung  . COPD Father   . Heart attack Father   . Stroke Father   . Diabetes Paternal Aunt    Past Surgical History:  Procedure Laterality Date  . COLONOSCOPY WITH PROPOFOL N/A 04/08/2019   Procedure: COLONOSCOPY WITH PROPOFOL;  Surgeon: Lucilla Lame, MD;  Location: [place];  Service: Endoscopy;  Laterality: N/A;  . frozen shoulder    . LOWER EXTREMITY ANGIOGRAPHY Left 09/03/2019   Procedure: LOWER EXTREMITY ANGIOGRAPHY;  Surgeon: Katha Cabal, MD;  Location: [place] [facility];  Service: Cardiovascular;  Laterality: Left;  . SHOULDER SURGERY     2015 and 2016   Patient Active Problem List   Diagnosis Date Noted  . Atherosclerosis of native arteries of the extremities with ulceration ([place]) 08/26/2019  . Diabetes ([place]) 08/26/2019  . DJD (degenerative joint disease)  08/26/2019  . Special screening for malignant neoplasms, colon       Prior to Admission medications   Medication Sig Start Date End Date Taking? Authorizing Provider  aspirin EC 81 MG tablet Take 81 mg by mouth daily.    [provider]  atorvastatin (LIPITOR) 10 MG tablet Take 1 tablet (10 mg total) by mouth daily. Additional refills per the patient's primary care 09/03/19   Schnier, Dolores Lory, MD  Blood Glucose Monitoring Suppl (ONE TOUCH ULTRA 2) w/Device KIT  01/08/19   [provider]  clopidogrel (PLAVIX) 75 MG tablet Take 1 tablet (75 mg total) by mouth daily. 09/04/19 09/03/20  Schnier, Dolores Lory, MD  COMBIGAN 0.2-0.5 % ophthalmic solution Place 1 drop into both eyes 2 (two) times daily.  11/26/18   [provider]  dorzolamide-timolol (COSOPT) 22.3-6.8 MG/ML ophthalmic solution  10/04/19   [provider]  glipiZIDE (GLUCOTROL) 5 MG tablet Take 1 tablet (5 mg total) by mouth 2 (two) times daily before a meal. 06/24/19   Juline Patch, MD  ibuprofen (ADVIL) 200 MG tablet Take 400 mg by mouth every 6 (six) hours as needed for moderate pain.     [provider]  Lancets [facility] DELICA PLUS [id number]) [place] USE DAILY 09/29/19   Juline Patch, MD  latanoprost (XALATAN) 0.005 % ophthalmic solution Place 1 drop into both eyes  at bedtime.  12/26/18   [provider]  meloxicam (MOBIC) 15 MG tablet  07/02/19   [provider]  metFORMIN (GLUCOPHAGE) 500 MG tablet Take 1 tablet (500 mg total) by mouth 2 (two) times daily with a meal. 06/24/19   Juline Patch, MD  [facility] ULTRA test strip USE TO TEST BLOOD SUGAR ONCE DAILY 09/29/19   Juline Patch, MD    Allergies Patient has no known allergies.    Social History Social History   Tobacco Use  . Smoking status: Never Smoker  . Smokeless tobacco: Current User    Types: Chew  Vaping Use  . Vaping Use: Never used  Substance Use Topics  . Alcohol use: Not Currently     Comment: quit 11/2017  . Drug use: Not Currently    Review of Systems Patient denies headaches, rhinorrhea, blurry vision, numbness, shortness of breath, chest pain, edema, cough, abdominal pain, nausea, vomiting, diarrhea, dysuria, fevers, rashes or hallucinations unless otherwise stated above in HPI. ____________________________________________   PHYSICAL EXAM:  VITAL SIGNS: Vitals:   11/01/19 1138  BP: 122/69  Pulse: 85  Resp: 20  Temp: 98.6 F (37 C)  SpO2: 100%    Constitutional: Alert and oriented.  Eyes: Conjunctivae are normal.  Head: Atraumatic. Nose: No congestion/rhinnorhea. Mouth/Throat: Mucous membranes are moist.   Neck: No stridor. Painless ROM.  Cardiovascular: Normal rate, regular rhythm. Grossly normal heart sounds.  Good peripheral circulation. Respiratory: Normal respiratory effort.  No retractions. Lungs CTAB. Gastrointestinal: Soft and nontender. No distention. No abdominal bruits. No CVA tenderness. Genitourinary:  Musculoskeletal: No lower extremity tenderness, ulceration and surrounding cellulitis of the base of the left great toe with surrounding and streaking cellulitis extending more proximally.  Week DP and PT pulses.  Cap refill is present.  No joint effusions. Neurologic:  Normal speech and language. No gross focal neurologic deficits are appreciated. No facial droop Skin:  Skin is warm, dry and intact. No rash noted. Psychiatric: Mood and affect are normal. Speech and behavior are normal.  ____________________________________________   LABS (all labs ordered are listed, but only abnormal results are displayed)  Results for orders placed or performed during the hospital encounter of 11/01/19 (from the past 24 hour(s))  Lactic acid, plasma     Status: None   Collection Time: 11/01/19 11:42 AM  Result Value Ref Range   Lactic Acid, Venous 1.1 0.5 - 1.9 mmol/L  Comprehensive metabolic panel     Status: Abnormal   Collection Time: 11/01/19  11:42 AM  Result Value Ref Range   Sodium 135 135 - 145 mmol/L   Potassium 4.2 3.5 - 5.1 mmol/L   Chloride 99 98 - 111 mmol/L   CO2 25 22 - 32 mmol/L   Glucose, Bld 140 (H) 70 - 99 mg/dL   BUN 23 (H) 6 - 20 mg/dL   Creatinine, Ser 0.92 0.61 - 1.24 mg/dL   Calcium 9.4 8.9 - 10.3 mg/dL   Total Protein 8.1 6.5 - 8.1 g/dL   Albumin 3.8 3.5 - 5.0 g/dL   AST 13 (L) 15 - 41 U/L   ALT 14 0 - 44 U/L   Alkaline Phosphatase 40 38 - 126 U/L   Total Bilirubin 0.9 0.3 - 1.2 mg/dL   GFR, Estimated >60 >60 mL/min   Anion gap 11 5 - 15  CBC with Differential     Status: None   Collection Time: 11/01/19 11:42 AM  Result Value Ref Range   WBC 10.4  4.0 - 10.5 K/uL   RBC 5.27 4.22 - 5.81 MIL/uL   Hemoglobin 14.7 13.0 - 17.0 g/dL   HCT 44.7 39 - 52 %   MCV 84.8 80.0 - 100.0 fL   MCH 27.9 26.0 - 34.0 pg   MCHC 32.9 30.0 - 36.0 g/dL   RDW 13.0 11.5 - 15.5 %   Platelets 206 150 - 400 K/uL   nRBC 0.0 0.0 - 0.2 %   Neutrophils Relative % 74 %   Neutro Abs 7.7 1.7 - 7.7 K/uL   Lymphocytes Relative 16 %   Lymphs Abs 1.7 0.7 - 4.0 K/uL   Monocytes Relative 8 %   Monocytes Absolute 0.9 0.1 - 1.0 K/uL   Eosinophils Relative 1 %   Eosinophils Absolute 0.1 0.0 - 0.5 K/uL   Basophils Relative 1 %   Basophils Absolute 0.1 0.0 - 0.1 K/uL   Immature Granulocytes 0 %   Abs Immature Granulocytes 0.04 0.00 - 0.07 K/uL   ____________________________________________ ____________________________________________  RADIOLOGY  I personally reviewed all radiographic images ordered to evaluate for the above acute complaints and reviewed radiology reports and findings.  These findings were personally discussed with the patient.  Please see medical record for radiology report.  ____________________________________________   PROCEDURES  Procedure(s) performed:  Procedures    Critical Care performed: no ____________________________________________   INITIAL IMPRESSION / ASSESSMENT AND PLAN / ED  COURSE  Pertinent labs & imaging results that were available during my care of the patient were reviewed by me and considered in my medical decision making (see chart for details).   DDX: Ulcer, cellulitis, abscess, osteomyelitis  Larenzo Caples is a 59 y.o. who presents to the ED with evidence of left foot wound sent to the ER for IV antibiotics and admission.  Patient seen in podiatry clinic today with worsening necrotic wound in the setting of diabetes.  I consulted with Dr. Cleda Mccreedy podiatry who has requested MRI admission to hospital for antibiotics.  Will discuss with hospitalist.  Decision to proceed with operative management will be based on discussion with patient and pending results of MRI to further evaluate.  Have discussed with the patient and available family all diagnostics and treatments performed thus far and all questions were answered to the best of my ability. The patient demonstrates understanding and agreement with plan.      The patient was evaluated in Emergency Department today for the symptoms described in the history of present illness. He/she was evaluated in the context of the global COVID-19 pandemic, which necessitated consideration that the patient might be at risk for infection with the SARS-CoV-2 virus that causes COVID-19. Institutional protocols and algorithms that pertain to the evaluation of patients at risk for COVID-19 are in a state of rapid change based on information released by regulatory bodies including the CDC and federal and state organizations. These policies and algorithms were followed during the patient's care in the ED.  As part of my medical decision making, I reviewed the following data within the [place] notes reviewed and incorporated, Labs reviewed, notes from prior ED visits and [place] Controlled Substance Database   ____________________________________________   FINAL CLINICAL IMPRESSION(S) / ED DIAGNOSES  Final  diagnoses:  Diabetic ulcer of left midfoot associated with type 2 diabetes mellitus, with necrosis of muscle ([place])      NEW MEDICATIONS STARTED DURING THIS VISIT:  New Prescriptions   No medications on file     Note:  This document  was prepared using Systems analyst and may include unintentional dictation errors.    Merlyn Lot, MD 11/01/19 1302

## 2019-11-01 NOTE — ED Notes (Signed)
Pt currently in imaging.
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
## 2019-11-01 NOTE — H&P (View-Only) (Signed)
Reason for Consult: Cellulitis with abscess and osteomyelitis left foot. Referring Physician: Cleve Paolillo is an 59 y.o. male.  HPI: This is a 59 year old male with a history over the last month or so with ulceration on his left foot.  Was seen by his primary care physician this morning with increased redness and drainage.  Evaluated by myself in the office and noted to have increased necrosis of his ulceration with deeper appearing infection.  Decision was made for hospitalization for IV antibiotics and MRI to evaluate for bone or joint involvement as he will need some type of surgical debridement.  Past Medical History:  Diagnosis Date  . Diabetes mellitus without complication (HCC)    type 2  . Glaucoma   . Hyperlipidemia   . Hypertension   . Shoulder pain, left     Past Surgical History:  Procedure Laterality Date  . COLONOSCOPY WITH PROPOFOL N/A 04/08/2019   Procedure: COLONOSCOPY WITH PROPOFOL;  Surgeon: Midge Minium, MD;  Location: [facility];  Service: Endoscopy;  Laterality: N/A;  . frozen shoulder    . LOWER EXTREMITY ANGIOGRAPHY Left 09/03/2019   Procedure: LOWER EXTREMITY ANGIOGRAPHY;  Surgeon: Renford Dills, MD;  Location: [facility];  Service: Cardiovascular;  Laterality: Left;  . SHOULDER SURGERY     2015 and 2016    Family History  Problem Relation Age of Onset  . Diabetes Father   . Cancer Father        lung  . COPD Father   . Heart attack Father   . Stroke Father   . Diabetes Paternal Aunt     Social History:  reports that he has never smoked. His smokeless tobacco use includes chew. He reports previous alcohol use. He reports previous drug use.  Allergies: No Known Allergies  Medications:  Scheduled: . aspirin EC  81 mg Oral Daily  . atorvastatin  10 mg Oral Daily  . brimonidine-timolol  1 drop Both Eyes BID  . insulin aspart  0-5 Units Subcutaneous QHS  . insulin aspart  0-9 Units Subcutaneous TID WC  . latanoprost   1 drop Both Eyes QHS    Results for orders placed or performed during the hospital encounter of 11/01/19 (from the past 48 hour(s))  Lactic acid, plasma     Status: None   Collection Time: 11/01/19 11:42 AM  Result Value Ref Range   Lactic Acid, Venous 1.1 0.5 - 1.9 mmol/L    Comment: Performed at [facility], [address] [place], [place] [address]  Comprehensive metabolic panel     Status: Abnormal   Collection Time: 11/01/19 11:42 AM  Result Value Ref Range   Sodium 135 135 - 145 mmol/L   Potassium 4.2 3.5 - 5.1 mmol/L   Chloride 99 98 - 111 mmol/L   CO2 25 22 - 32 mmol/L   Glucose, Bld 140 (H) 70 - 99 mg/dL    Comment: Glucose reference range applies only to samples taken after fasting for at least 8 hours.   BUN 23 (H) 6 - 20 mg/dL   Creatinine, Ser 1.09 0.61 - 1.24 mg/dL   Calcium 9.4 8.9 - 32.3 mg/dL   Total Protein 8.1 6.5 - 8.1 g/dL   Albumin 3.8 3.5 - 5.0 g/dL   AST 13 (L) 15 - 41 U/L   ALT 14 0 - 44 U/L   Alkaline Phosphatase 40 38 - 126 U/L   Total Bilirubin 0.9 0.3 - 1.2 mg/dL  GFR, Estimated >60 >60 mL/min    Comment: (NOTE) Calculated using the CKD-EPI Creatinine Equation (2021)    Anion gap 11 5 - 15    Comment: Performed at [facility], [address] [place], [place] [address]  CBC with Differential     Status: None   Collection Time: 11/01/19 11:42 AM  Result Value Ref Range   WBC 10.4 4.0 - 10.5 K/uL   RBC 5.27 4.22 - 5.81 MIL/uL   Hemoglobin 14.7 13.0 - 17.0 g/dL   HCT 08.1 39 - 52 %   MCV 84.8 80.0 - 100.0 fL   MCH 27.9 26.0 - 34.0 pg   MCHC 32.9 30.0 - 36.0 g/dL   RDW 44.8 18.5 - 63.1 %   Platelets 206 150 - 400 K/uL   nRBC 0.0 0.0 - 0.2 %   Neutrophils Relative % 74 %   Neutro Abs 7.7 1.7 - 7.7 K/uL   Lymphocytes Relative 16 %   Lymphs Abs 1.7 0.7 - 4.0 K/uL   Monocytes Relative 8 %   Monocytes Absolute 0.9 0.1 - 1.0 K/uL   Eosinophils Relative 1 %   Eosinophils Absolute 0.1 0.0 - 0.5 K/uL   Basophils Relative 1  %   Basophils Absolute 0.1 0.0 - 0.1 K/uL   Immature Granulocytes 0 %   Abs Immature Granulocytes 0.04 0.00 - 0.07 K/uL    Comment: Performed at [facility], [address], [place], [place] [address]  Respiratory Panel by RT PCR (Flu A&B, Covid) - Nasopharyngeal Swab     Status: None   Collection Time: 11/01/19  2:09 PM   Specimen: Nasopharyngeal Swab  Result Value Ref Range   SARS Coronavirus 2 by RT PCR NEGATIVE NEGATIVE    Comment: (NOTE) SARS-CoV-2 target nucleic acids are NOT DETECTED.  The SARS-CoV-2 RNA is generally detectable in upper respiratoy specimens during the acute phase of infection. The lowest concentration of SARS-CoV-2 viral copies this assay can detect is 131 copies/mL. A negative result does not preclude SARS-Cov-2 infection and should not be used as the sole basis for treatment or other patient management decisions. A negative result may occur with  improper specimen collection/handling, submission of specimen other than nasopharyngeal swab, presence of viral mutation(s) within the areas targeted by this assay, and inadequate number of viral copies (<131 copies/mL). A negative result must be combined with clinical observations, patient history, and epidemiological information. The expected result is Negative.  Fact Sheet for Patients:  https://www.moore.com/  Fact Sheet for Healthcare Providers:  https://www.young.biz/  This test is no t yet approved or cleared by the [place] FDA and  has been authorized for detection and/or diagnosis of SARS-CoV-2 by FDA under an Emergency Use Authorization (EUA). This EUA will remain  in effect (meaning this test can be used) for the duration of the COVID-19 declaration under Section 564(b)(1) of the Act, 21 U.S.C. section 360bbb-3(b)(1), unless the authorization is terminated or revoked sooner.     Influenza A by PCR NEGATIVE NEGATIVE   Influenza B by PCR  NEGATIVE NEGATIVE    Comment: (NOTE) The Xpert Xpress SARS-CoV-2/FLU/RSV assay is intended as an aid in  the diagnosis of influenza from Nasopharyngeal swab specimens and  should not be used as a sole basis for treatment. Nasal washings and  aspirates are unacceptable for Xpert Xpress SARS-CoV-2/FLU/RSV  testing.  Fact Sheet for Patients: https://www.moore.com/  Fact Sheet for Healthcare Providers: https://www.young.biz/  This test is not yet approved or cleared by  the [place] FDA and  has been authorized for detection and/or diagnosis of SARS-CoV-2 by  FDA under an Emergency Use Authorization (EUA). This EUA will remain  in effect (meaning this test can be used) for the duration of the  Covid-19 declaration under Section 564(b)(1) of the Act, 21  U.S.C. section 360bbb-3(b)(1), unless the authorization is  terminated or revoked. Performed at [facility], [address] [place], [place] [address]   Sedimentation rate     Status: Abnormal   Collection Time: 11/01/19  2:09 PM  Result Value Ref Range   Sed Rate 57 (H) 0 - 20 mm/hr    Comment: Performed at [facility], [address] [place], [place] [address]  APTT     Status: Abnormal   Collection Time: 11/01/19  2:09 PM  Result Value Ref Range   aPTT 37 (H) 24 - 36 seconds    Comment:        IF BASELINE aPTT IS ELEVATED, SUGGEST PATIENT RISK ASSESSMENT BE USED TO DETERMINE APPROPRIATE ANTICOAGULANT THERAPY. Performed at [facility], [address] [place], [place] [address]   Protime-INR     Status: None   Collection Time: 11/01/19  2:09 PM  Result Value Ref Range   Prothrombin Time 14.0 11.4 - 15.2 seconds   INR 1.1 0.8 - 1.2    Comment: (NOTE) INR goal varies based on device and disease states. Performed at [facility], [address] [place], [place] [address]   Glucose, capillary     Status: Abnormal   Collection Time:  11/01/19  4:46 PM  Result Value Ref Range   Glucose-Capillary 105 (H) 70 - 99 mg/dL    Comment: Glucose reference range applies only to samples taken after fasting for at least 8 hours.   Comment 1 Notify RN     MR FOOT LEFT WO CONTRAST  Result Date: 11/01/2019 CLINICAL DATA:  Diabetic foot wound at the base of the left great toe and cellulitis EXAM: MRI OF THE LEFT FOOT WITHOUT CONTRAST TECHNIQUE: Multiplanar, multisequence MR imaging of the left forefoot was performed. No intravenous contrast was administered. COMPARISON:  Left foot x-ray 11/01/2019 FINDINGS: Bones/Joint/Cartilage Bone marrow edema within the medial hallux sesamoid with patchy low T1 marrow signal suggestive of osteomyelitis (series 3 and 5, images 21-25). Marrow signal of the lateral hallux sesamoid and first metatarsal head are within normal limits. The remaining osseous structures are normal in signal. No acute fracture. No dislocation. No joint effusion. No significant arthropathy. Ligaments Intact Lisfranc ligament. Collateral ligaments of the forefoot are intact. Muscles and Tendons Diffuse edema-like T2 signal throughout the intrinsic musculature which may represent myositis and/or denervation changes. Intact flexor and extensor tendons. No tenosynovitis. Soft tissues Superficial plantar foot ulceration underlying the first metatarsal head with associated soft tissue thickening and soft tissue edema. Multiple foci of susceptibility within the plantar soft tissues underlying the proximal phalanx of the great toe. No organized fluid collection. Mild diffuse subcutaneous edema. There is nodular thickening of the plantar fascia in two locations, one of which is at the level of the mid first metatarsal diaphysis and additional fusiform thickening at the level of the first TMT joint (series 5, images 33 and 40). IMPRESSION: 1. Bone marrow signal changes within the medial hallux sesamoid suspicious for osteomyelitis. No additional sites  of marrow signal abnormality within the great toe or remaining forefoot. 2. Plantar foot ulceration underlying the first metatarsal head with associated soft tissue thickening  and soft tissue edema. Multiple foci of susceptibility within the plantar soft tissues underlying the proximal phalanx of the great toe which may reflect air tracking from the adjacent ulceration or possibly from a gas-forming infection. 3. Diffuse edema-like signal throughout the intrinsic musculature of the forefoot which may represent myositis and/or denervation changes. 4. Findings of plantar fibromatosis. Electronically Signed   By: Duanne Guess D.O.   On: 11/01/2019 13:54   DG Foot Complete Left  Result Date: 11/01/2019 CLINICAL DATA:  LEFT foot pain for 1 month, infected ulcer medially, diabetes mellitus, hypertension EXAM: LEFT FOOT - COMPLETE 3+ VIEW COMPARISON:  None FINDINGS: Osseous mineralization normal. Joint spaces preserved. Dressing artifacts overlie the medial aspect of the LEFT foot extending from the mid first metatarsal to the mid proximal phalanx great toe. No acute fracture, dislocation, or bone destruction. Small vessel vascular calcifications at ankle and distal lower leg. IMPRESSION: No acute osseous abnormalities. If there is persistent clinical concern for osteomyelitis or deep soft tissue infection consider MR. Electronically Signed   By: Ulyses Southward M.D.   On: 11/01/2019 12:31    Review of Systems  Constitutional: Negative for chills and fever.  HENT: Negative for congestion and sore throat.   Respiratory: Negative for cough and shortness of breath.   Cardiovascular: Negative for chest pain and palpitations.  Gastrointestinal: Negative for nausea and vomiting.  Endocrine: Negative for polydipsia and polyuria.  Genitourinary: Negative for frequency and urgency.  Musculoskeletal:       Some recent increased pain in his left foot.  Skin:       Increased redness and swelling with some drainage  from the ulceration on his left foot.  Neurological:       Patient does relate neuropathy associated with his diabetes.  Hematological: Does not bruise/bleed easily.  Psychiatric/Behavioral: Negative for confusion. The patient is not nervous/anxious.    Blood pressure 125/75, pulse 73, temperature 98.6 F (37 C), temperature source Oral, resp. rate 20, height 6\' 5"  (1.956 m), weight 99.8 kg, SpO2 100 %. Physical Exam Cardiovascular:     Comments: DP and PT pulses are diminished but palpable on the left. Musculoskeletal:     Comments: Some digital contractures on the left foot with some plantar displacement of the first metatarsal.  Otherwise adequate range of motion and muscle testing.  Skin:    Comments: The skin is warm dry and supple.  Some erythema in the left forefoot with necrotic appearing full-thickness ulceration on the plantar aspect of the left first MTPJ.  Some bleeding and drainage.  Neurological:     Comments: Loss of protective threshold with monofilament wire distally in the forefoot and toes.       Assessment/Plan: Assessment: 1.  Osteomyelitis tibial sesamoid. 2.  Cellulitis with abscess left forefoot. 3.  Diabetes with associated neuropathy. 4.  Peripheral vascular disease.  Plan: Discussed with the patient his MRI findings and the need for I&D of the abscessed area and tibial sesamoid on the left foot.  Discussed possible risks and complications of the procedure including but not limited to inability to heal due to his peripheral vascular disease or diabetes as well as continued infection which may eventually get into other bones or the joint which could require further surgery.  No guarantees could be given as to the outcome of surgery.  Questions invited and answered.  Patient is currently ordered n.p.o. after midnight.  Obtain consent for I&D left foot.  Plan for surgery tomorrow.   Klara Stjames 11/01/2019, 5:55 PM     
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
## 2019-11-01 NOTE — Progress Notes (Signed)
PHARMACY -  BRIEF ANTIBIOTIC NOTE   Pharmacy has received consult(s) for Vancomycin from an ED provider.  The patient's profile has been reviewed for ht/wt/allergies/indication/available labs.    One time order(s) placed for Vancomycin 2gm IV  Further antibiotics/pharmacy consults should be ordered by admitting physician if indicated.         Raiford Noble, PharmD Pharmacy Resident  11/01/2019 1:04 PM

## 2019-11-01 NOTE — Consult Note (Signed)
Reason for Consult: Cellulitis with abscess and osteomyelitis left foot. Referring Physician: [name] is an 59 y.o. male.  HPI: This is a 59 year old male with a history over the last month or so with ulceration on his left foot.  Was seen by his primary care physician this morning with increased redness and drainage.  Evaluated by myself in the office and noted to have increased necrosis of his ulceration with deeper appearing infection.  Decision was made for hospitalization for IV antibiotics and MRI to evaluate for bone or joint involvement as he will need some type of surgical debridement.  Past Medical History:  Diagnosis Date  . Diabetes mellitus without complication (HCC)    type 2  . Glaucoma   . Hyperlipidemia   . Hypertension   . Shoulder pain, left     Past Surgical History:  Procedure Laterality Date  . COLONOSCOPY WITH PROPOFOL N/A 04/08/2019   Procedure: COLONOSCOPY WITH PROPOFOL;  Surgeon: Midge Minium, MD;  Location: [facility];  Service: Endoscopy;  Laterality: N/A;  . frozen shoulder    . LOWER EXTREMITY ANGIOGRAPHY Left 09/03/2019   Procedure: LOWER EXTREMITY ANGIOGRAPHY;  Surgeon: Renford Dills, MD;  Location: [facility];  Service: Cardiovascular;  Laterality: Left;  . SHOULDER SURGERY     2015 and 2016    Family History  Problem Relation Age of Onset  . Diabetes Father   . Cancer Father        lung  . COPD Father   . Heart attack Father   . Stroke Father   . Diabetes Paternal Aunt     Social History:  reports that he has never smoked. His smokeless tobacco use includes chew. He reports previous alcohol use. He reports previous drug use.  Allergies: No Known Allergies  Medications:  Scheduled: . aspirin EC  81 mg Oral Daily  . atorvastatin  10 mg Oral Daily  . brimonidine-timolol  1 drop Both Eyes BID  . insulin aspart  0-5 Units Subcutaneous QHS  . insulin aspart  0-9 Units Subcutaneous TID WC  . latanoprost   1 drop Both Eyes QHS    Results for orders placed or performed during the hospital encounter of 11/01/19 (from the past 48 hour(s))  Lactic acid, plasma     Status: None   Collection Time: 11/01/19 11:42 AM  Result Value Ref Range   Lactic Acid, Venous 1.1 0.5 - 1.9 mmol/L    Comment: Performed at [facility], [address] [place], [place] [address]  Comprehensive metabolic panel     Status: Abnormal   Collection Time: 11/01/19 11:42 AM  Result Value Ref Range   Sodium 135 135 - 145 mmol/L   Potassium 4.2 3.5 - 5.1 mmol/L   Chloride 99 98 - 111 mmol/L   CO2 25 22 - 32 mmol/L   Glucose, Bld 140 (H) 70 - 99 mg/dL    Comment: Glucose reference range applies only to samples taken after fasting for at least 8 hours.   BUN 23 (H) 6 - 20 mg/dL   Creatinine, Ser 1.09 0.61 - 1.24 mg/dL   Calcium 9.4 8.9 - 32.3 mg/dL   Total Protein 8.1 6.5 - 8.1 g/dL   Albumin 3.8 3.5 - 5.0 g/dL   AST 13 (L) 15 - 41 U/L   ALT 14 0 - 44 U/L   Alkaline Phosphatase 40 38 - 126 U/L   Total Bilirubin 0.9 0.3 - 1.2 mg/dL  GFR, Estimated >60 >60 mL/min    Comment: (NOTE) Calculated using the CKD-EPI Creatinine Equation (2021)    Anion gap 11 5 - 15    Comment: Performed at [facility], [address] [place], [place] [address]  CBC with Differential     Status: None   Collection Time: 11/01/19 11:42 AM  Result Value Ref Range   WBC 10.4 4.0 - 10.5 K/uL   RBC 5.27 4.22 - 5.81 MIL/uL   Hemoglobin 14.7 13.0 - 17.0 g/dL   HCT 08.1 39 - 52 %   MCV 84.8 80.0 - 100.0 fL   MCH 27.9 26.0 - 34.0 pg   MCHC 32.9 30.0 - 36.0 g/dL   RDW 44.8 18.5 - 63.1 %   Platelets 206 150 - 400 K/uL   nRBC 0.0 0.0 - 0.2 %   Neutrophils Relative % 74 %   Neutro Abs 7.7 1.7 - 7.7 K/uL   Lymphocytes Relative 16 %   Lymphs Abs 1.7 0.7 - 4.0 K/uL   Monocytes Relative 8 %   Monocytes Absolute 0.9 0.1 - 1.0 K/uL   Eosinophils Relative 1 %   Eosinophils Absolute 0.1 0.0 - 0.5 K/uL   Basophils Relative 1  %   Basophils Absolute 0.1 0.0 - 0.1 K/uL   Immature Granulocytes 0 %   Abs Immature Granulocytes 0.04 0.00 - 0.07 K/uL    Comment: Performed at [facility], [address], [place], [place] [address]  Respiratory Panel by RT PCR (Flu A&B, Covid) - Nasopharyngeal Swab     Status: None   Collection Time: 11/01/19  2:09 PM   Specimen: Nasopharyngeal Swab  Result Value Ref Range   SARS Coronavirus 2 by RT PCR NEGATIVE NEGATIVE    Comment: (NOTE) SARS-CoV-2 target nucleic acids are NOT DETECTED.  The SARS-CoV-2 RNA is generally detectable in upper respiratoy specimens during the acute phase of infection. The lowest concentration of SARS-CoV-2 viral copies this assay can detect is 131 copies/mL. A negative result does not preclude SARS-Cov-2 infection and should not be used as the sole basis for treatment or other patient management decisions. A negative result may occur with  improper specimen collection/handling, submission of specimen other than nasopharyngeal swab, presence of viral mutation(s) within the areas targeted by this assay, and inadequate number of viral copies (<131 copies/mL). A negative result must be combined with clinical observations, patient history, and epidemiological information. The expected result is Negative.  Fact Sheet for Patients:  https://www.moore.com/  Fact Sheet for Healthcare Providers:  https://www.young.biz/  This test is no t yet approved or cleared by the [place] FDA and  has been authorized for detection and/or diagnosis of SARS-CoV-2 by FDA under an Emergency Use Authorization (EUA). This EUA will remain  in effect (meaning this test can be used) for the duration of the COVID-19 declaration under Section 564(b)(1) of the Act, 21 U.S.C. section 360bbb-3(b)(1), unless the authorization is terminated or revoked sooner.     Influenza A by PCR NEGATIVE NEGATIVE   Influenza B by PCR  NEGATIVE NEGATIVE    Comment: (NOTE) The Xpert Xpress SARS-CoV-2/FLU/RSV assay is intended as an aid in  the diagnosis of influenza from Nasopharyngeal swab specimens and  should not be used as a sole basis for treatment. Nasal washings and  aspirates are unacceptable for Xpert Xpress SARS-CoV-2/FLU/RSV  testing.  Fact Sheet for Patients: https://www.moore.com/  Fact Sheet for Healthcare Providers: https://www.young.biz/  This test is not yet approved or cleared by  the [place] FDA and  has been authorized for detection and/or diagnosis of SARS-CoV-2 by  FDA under an Emergency Use Authorization (EUA). This EUA will remain  in effect (meaning this test can be used) for the duration of the  Covid-19 declaration under Section 564(b)(1) of the Act, 21  U.S.C. section 360bbb-3(b)(1), unless the authorization is  terminated or revoked. Performed at [facility], [address] [place], [place] [address]   Sedimentation rate     Status: Abnormal   Collection Time: 11/01/19  2:09 PM  Result Value Ref Range   Sed Rate 57 (H) 0 - 20 mm/hr    Comment: Performed at [facility], [address] [place], [place] [address]  APTT     Status: Abnormal   Collection Time: 11/01/19  2:09 PM  Result Value Ref Range   aPTT 37 (H) 24 - 36 seconds    Comment:        IF BASELINE aPTT IS ELEVATED, SUGGEST PATIENT RISK ASSESSMENT BE USED TO DETERMINE APPROPRIATE ANTICOAGULANT THERAPY. Performed at [facility], [address] [place], [place] [address]   Protime-INR     Status: None   Collection Time: 11/01/19  2:09 PM  Result Value Ref Range   Prothrombin Time 14.0 11.4 - 15.2 seconds   INR 1.1 0.8 - 1.2    Comment: (NOTE) INR goal varies based on device and disease states. Performed at [facility], [address] [place], [place] [address]   Glucose, capillary     Status: Abnormal   Collection Time:  11/01/19  4:46 PM  Result Value Ref Range   Glucose-Capillary 105 (H) 70 - 99 mg/dL    Comment: Glucose reference range applies only to samples taken after fasting for at least 8 hours.   Comment 1 Notify RN     MR FOOT LEFT WO CONTRAST  Result Date: 11/01/2019 CLINICAL DATA:  Diabetic foot wound at the base of the left great toe and cellulitis EXAM: MRI OF THE LEFT FOOT WITHOUT CONTRAST TECHNIQUE: Multiplanar, multisequence MR imaging of the left forefoot was performed. No intravenous contrast was administered. COMPARISON:  Left foot x-ray 11/01/2019 FINDINGS: Bones/Joint/Cartilage Bone marrow edema within the medial hallux sesamoid with patchy low T1 marrow signal suggestive of osteomyelitis (series 3 and 5, images 21-25). Marrow signal of the lateral hallux sesamoid and first metatarsal head are within normal limits. The remaining osseous structures are normal in signal. No acute fracture. No dislocation. No joint effusion. No significant arthropathy. Ligaments Intact Lisfranc ligament. Collateral ligaments of the forefoot are intact. Muscles and Tendons Diffuse edema-like T2 signal throughout the intrinsic musculature which may represent myositis and/or denervation changes. Intact flexor and extensor tendons. No tenosynovitis. Soft tissues Superficial plantar foot ulceration underlying the first metatarsal head with associated soft tissue thickening and soft tissue edema. Multiple foci of susceptibility within the plantar soft tissues underlying the proximal phalanx of the great toe. No organized fluid collection. Mild diffuse subcutaneous edema. There is nodular thickening of the plantar fascia in two locations, one of which is at the level of the mid first metatarsal diaphysis and additional fusiform thickening at the level of the first TMT joint (series 5, images 33 and 40). IMPRESSION: 1. Bone marrow signal changes within the medial hallux sesamoid suspicious for osteomyelitis. No additional sites  of marrow signal abnormality within the great toe or remaining forefoot. 2. Plantar foot ulceration underlying the first metatarsal head with associated soft tissue thickening  and soft tissue edema. Multiple foci of susceptibility within the plantar soft tissues underlying the proximal phalanx of the great toe which may reflect air tracking from the adjacent ulceration or possibly from a gas-forming infection. 3. Diffuse edema-like signal throughout the intrinsic musculature of the forefoot which may represent myositis and/or denervation changes. 4. Findings of plantar fibromatosis. Electronically Signed   By: Duanne Guess D.O.   On: 11/01/2019 13:54   DG Foot Complete Left  Result Date: 11/01/2019 CLINICAL DATA:  LEFT foot pain for 1 month, infected ulcer medially, diabetes mellitus, hypertension EXAM: LEFT FOOT - COMPLETE 3+ VIEW COMPARISON:  None FINDINGS: Osseous mineralization normal. Joint spaces preserved. Dressing artifacts overlie the medial aspect of the LEFT foot extending from the mid first metatarsal to the mid proximal phalanx great toe. No acute fracture, dislocation, or bone destruction. Small vessel vascular calcifications at ankle and distal lower leg. IMPRESSION: No acute osseous abnormalities. If there is persistent clinical concern for osteomyelitis or deep soft tissue infection consider MR. Electronically Signed   By: Ulyses Southward M.D.   On: 11/01/2019 12:31    Review of Systems  Constitutional: Negative for chills and fever.  HENT: Negative for congestion and sore throat.   Respiratory: Negative for cough and shortness of breath.   Cardiovascular: Negative for chest pain and palpitations.  Gastrointestinal: Negative for nausea and vomiting.  Endocrine: Negative for polydipsia and polyuria.  Genitourinary: Negative for frequency and urgency.  Musculoskeletal:       Some recent increased pain in his left foot.  Skin:       Increased redness and swelling with some drainage  from the ulceration on his left foot.  Neurological:       Patient does relate neuropathy associated with his diabetes.  Hematological: Does not bruise/bleed easily.  Psychiatric/Behavioral: Negative for confusion. The patient is not nervous/anxious.    Blood pressure 125/75, pulse 73, temperature 98.6 F (37 C), temperature source Oral, resp. rate 20, height 6\' 5"  (1.956 m), weight 99.8 kg, SpO2 100 %. Physical Exam Cardiovascular:     Comments: DP and PT pulses are diminished but palpable on the left. Musculoskeletal:     Comments: Some digital contractures on the left foot with some plantar displacement of the first metatarsal.  Otherwise adequate range of motion and muscle testing.  Skin:    Comments: The skin is warm dry and supple.  Some erythema in the left forefoot with necrotic appearing full-thickness ulceration on the plantar aspect of the left first MTPJ.  Some bleeding and drainage.  Neurological:     Comments: Loss of protective threshold with monofilament wire distally in the forefoot and toes.       Assessment/Plan: Assessment: 1.  Osteomyelitis tibial sesamoid. 2.  Cellulitis with abscess left forefoot. 3.  Diabetes with associated neuropathy. 4.  Peripheral vascular disease.  Plan: Discussed with the patient his MRI findings and the need for I&D of the abscessed area and tibial sesamoid on the left foot.  Discussed possible risks and complications of the procedure including but not limited to inability to heal due to his peripheral vascular disease or diabetes as well as continued infection which may eventually get into other bones or the joint which could require further surgery.  No guarantees could be given as to the outcome of surgery.  Questions invited and answered.  Patient is currently ordered n.p.o. after midnight.  Obtain consent for I&D left foot.  Plan for surgery tomorrow.   Reginald Nielsen 11/01/2019, 5:55 PM

## 2019-11-02 ENCOUNTER — Encounter: Payer: Self-pay | Admitting: Internal Medicine

## 2019-11-02 ENCOUNTER — Encounter
Admission: EM | Disposition: A | Discharge status: Home Health | Payer: Self-pay | Source: Ambulatory Visit | Attending: Internal Medicine

## 2019-11-02 ENCOUNTER — Observation Stay: Payer: PPO

## 2019-11-02 ENCOUNTER — Observation Stay: Payer: PPO | Admitting: Anesthesiology

## 2019-11-02 DIAGNOSIS — I70262 Atherosclerosis of native arteries of extremities with gangrene, left leg: Secondary | ICD-10-CM | POA: Diagnosis not present

## 2019-11-02 DIAGNOSIS — L089 Local infection of the skin and subcutaneous tissue, unspecified: Secondary | ICD-10-CM | POA: Diagnosis not present

## 2019-11-02 DIAGNOSIS — E1152 Type 2 diabetes mellitus with diabetic peripheral angiopathy with gangrene: Secondary | ICD-10-CM | POA: Diagnosis present

## 2019-11-02 DIAGNOSIS — I70201 Unspecified atherosclerosis of native arteries of extremities, right leg: Secondary | ICD-10-CM | POA: Diagnosis present

## 2019-11-02 DIAGNOSIS — E1140 Type 2 diabetes mellitus with diabetic neuropathy, unspecified: Secondary | ICD-10-CM | POA: Diagnosis present

## 2019-11-02 DIAGNOSIS — Z7902 Long term (current) use of antithrombotics/antiplatelets: Secondary | ICD-10-CM | POA: Diagnosis not present

## 2019-11-02 DIAGNOSIS — M8618 Other acute osteomyelitis, other site: Secondary | ICD-10-CM | POA: Diagnosis not present

## 2019-11-02 DIAGNOSIS — I739 Peripheral vascular disease, unspecified: Secondary | ICD-10-CM | POA: Diagnosis not present

## 2019-11-02 DIAGNOSIS — M86172 Other acute osteomyelitis, left ankle and foot: Secondary | ICD-10-CM | POA: Diagnosis not present

## 2019-11-02 DIAGNOSIS — Z7982 Long term (current) use of aspirin: Secondary | ICD-10-CM | POA: Diagnosis not present

## 2019-11-02 DIAGNOSIS — L02612 Cutaneous abscess of left foot: Secondary | ICD-10-CM | POA: Diagnosis present

## 2019-11-02 DIAGNOSIS — L97423 Non-pressure chronic ulcer of left heel and midfoot with necrosis of muscle: Secondary | ICD-10-CM | POA: Diagnosis present

## 2019-11-02 DIAGNOSIS — E785 Hyperlipidemia, unspecified: Secondary | ICD-10-CM | POA: Diagnosis present

## 2019-11-02 DIAGNOSIS — E1169 Type 2 diabetes mellitus with other specified complication: Secondary | ICD-10-CM | POA: Diagnosis present

## 2019-11-02 DIAGNOSIS — I96 Gangrene, not elsewhere classified: Secondary | ICD-10-CM | POA: Diagnosis present

## 2019-11-02 DIAGNOSIS — F17220 Nicotine dependence, chewing tobacco, uncomplicated: Secondary | ICD-10-CM | POA: Diagnosis present

## 2019-11-02 DIAGNOSIS — Z7984 Long term (current) use of oral hypoglycemic drugs: Secondary | ICD-10-CM | POA: Diagnosis not present

## 2019-11-02 DIAGNOSIS — I70244 Atherosclerosis of native arteries of left leg with ulceration of heel and midfoot: Secondary | ICD-10-CM | POA: Diagnosis present

## 2019-11-02 DIAGNOSIS — M869 Osteomyelitis, unspecified: Secondary | ICD-10-CM | POA: Diagnosis present

## 2019-11-02 DIAGNOSIS — E1151 Type 2 diabetes mellitus with diabetic peripheral angiopathy without gangrene: Secondary | ICD-10-CM | POA: Diagnosis not present

## 2019-11-02 DIAGNOSIS — Z79899 Other long term (current) drug therapy: Secondary | ICD-10-CM | POA: Diagnosis not present

## 2019-11-02 DIAGNOSIS — L03116 Cellulitis of left lower limb: Secondary | ICD-10-CM | POA: Diagnosis not present

## 2019-11-02 DIAGNOSIS — Z8249 Family history of ischemic heart disease and other diseases of the circulatory system: Secondary | ICD-10-CM | POA: Diagnosis not present

## 2019-11-02 DIAGNOSIS — I10 Essential (primary) hypertension: Secondary | ICD-10-CM | POA: Diagnosis present

## 2019-11-02 DIAGNOSIS — M722 Plantar fascial fibromatosis: Secondary | ICD-10-CM | POA: Diagnosis present

## 2019-11-02 DIAGNOSIS — I70212 Atherosclerosis of native arteries of extremities with intermittent claudication, left leg: Secondary | ICD-10-CM | POA: Diagnosis present

## 2019-11-02 DIAGNOSIS — E11621 Type 2 diabetes mellitus with foot ulcer: Secondary | ICD-10-CM | POA: Diagnosis present

## 2019-11-02 DIAGNOSIS — B9561 Methicillin susceptible Staphylococcus aureus infection as the cause of diseases classified elsewhere: Secondary | ICD-10-CM | POA: Diagnosis present

## 2019-11-02 DIAGNOSIS — Z20822 Contact with and (suspected) exposure to covid-19: Secondary | ICD-10-CM | POA: Diagnosis present

## 2019-11-02 DIAGNOSIS — H409 Unspecified glaucoma: Secondary | ICD-10-CM | POA: Diagnosis present

## 2019-11-02 HISTORY — PX: IRRIGATION AND DEBRIDEMENT FOOT: SHX6602

## 2019-11-02 LAB — BASIC METABOLIC PANEL
Anion gap: 10 (ref 5–15)
BUN: 16 mg/dL (ref 6–20)
CO2: 24 mmol/L (ref 22–32)
Calcium: 8.9 mg/dL (ref 8.9–10.3)
Chloride: 101 mmol/L (ref 98–111)
Creatinine, Ser: 1.14 mg/dL (ref 0.61–1.24)
GFR, Estimated: >60 mL/min (ref >60)
Glucose, Bld: 126 mg/dL — ABNORMAL HIGH (ref 70–99)
Potassium: 4.1 mmol/L (ref 3.5–5.1)
Sodium: 135 mmol/L (ref 135–145)

## 2019-11-02 LAB — CBC
HCT: 41.1 % (ref 39.0–52.0)
Hemoglobin: 13.7 g/dL (ref 13.0–17.0)
MCH: 28.1 pg (ref 26.0–34.0)
MCHC: 33.3 g/dL (ref 30.0–36.0)
MCV: 84.4 fL (ref 80.0–100.0)
Platelets: 186 10*3/uL (ref 150–400)
RBC: 4.87 MIL/uL (ref 4.22–5.81)
RDW: 13 % (ref 11.5–15.5)
WBC: 8.1 10*3/uL (ref 4.0–10.5)
nRBC: 0 % (ref 0.0–0.2)

## 2019-11-02 LAB — GLUCOSE, CAPILLARY
Glucose-Capillary: 88 mg/dL (ref 70–99)
Glucose-Capillary: 93 mg/dL (ref 70–99)
Glucose-Capillary: 282 mg/dL — ABNORMAL HIGH (ref 70–99)
Glucose-Capillary: 240 mg/dL — ABNORMAL HIGH (ref 70–99)

## 2019-11-02 SURGERY — IRRIGATION AND DEBRIDEMENT FOOT
Anesthesia: General | Site: Foot | Laterality: Left

## 2019-11-02 MED ORDER — BUPIVACAINE HCL (PF) 0.5 % IJ SOLN
INTRAMUSCULAR | Status: DC | PRN
  Administered 2019-11-02: 10 mL

## 2019-11-02 MED ORDER — LIDOCAINE HCL (CARDIAC) PF 100 MG/5ML IV SOSY
PREFILLED_SYRINGE | INTRAVENOUS | Status: DC | PRN
  Administered 2019-11-02: 100 mg via INTRAVENOUS

## 2019-11-02 MED ORDER — ONDANSETRON HCL 4 MG/2ML IJ SOLN
INTRAMUSCULAR | Status: AC
  Filled 2019-11-02: qty 2

## 2019-11-02 MED ORDER — PHENYLEPHRINE HCL (PRESSORS) 10 MG/ML IV SOLN
INTRAVENOUS | Status: DC | PRN
  Administered 2019-11-02: 150 ug via INTRAVENOUS
  Administered 2019-11-02: 100 ug via INTRAVENOUS
  Administered 2019-11-02 (×3): 150 ug via INTRAVENOUS
  Administered 2019-11-02: 100 ug via INTRAVENOUS
  Administered 2019-11-02: 150 ug via INTRAVENOUS

## 2019-11-02 MED ORDER — PROPOFOL 10 MG/ML IV BOLUS
INTRAVENOUS | Status: DC | PRN
  Administered 2019-11-02: 150 mg via INTRAVENOUS

## 2019-11-02 MED ORDER — FENTANYL CITRATE (PF) 100 MCG/2ML IJ SOLN
INTRAMUSCULAR | Status: DC | PRN
  Administered 2019-11-02: 50 ug via INTRAVENOUS

## 2019-11-02 MED ORDER — ONDANSETRON HCL 4 MG/2ML IJ SOLN: 4.0000 mg | Freq: Once | INTRAMUSCULAR | Status: DC | PRN

## 2019-11-02 MED ORDER — LACTATED RINGERS IV SOLN: INTRAVENOUS | Status: DC | PRN

## 2019-11-02 MED ORDER — DEXAMETHASONE SODIUM PHOSPHATE 10 MG/ML IJ SOLN
INTRAMUSCULAR | Status: DC | PRN
  Administered 2019-11-02: 5 mg via INTRAVENOUS

## 2019-11-02 MED ORDER — FENTANYL CITRATE (PF) 100 MCG/2ML IJ SOLN
INTRAMUSCULAR | Status: AC
  Filled 2019-11-02: qty 2

## 2019-11-02 MED ORDER — LIDOCAINE HCL (PF) 2 % IJ SOLN
INTRAMUSCULAR | Status: AC
  Filled 2019-11-02: qty 5

## 2019-11-02 MED ORDER — OXYCODONE-ACETAMINOPHEN 5-325 MG PO TABS
1.0000 | ORAL_TABLET | ORAL | Status: DC | PRN
  Administered 2019-11-02 – 2019-11-09 (×26): 2 via ORAL
  Filled 2019-11-02 (×4): qty 2
  Filled 2019-11-02: qty 1
  Filled 2019-11-02 (×3): qty 2
  Filled 2019-11-02: qty 1
  Filled 2019-11-02 (×14): qty 2
  Filled 2019-11-02: qty 1
  Filled 2019-11-02 (×3): qty 2
  Filled 2019-11-02: qty 1
  Filled 2019-11-02: qty 2

## 2019-11-02 MED ORDER — MIDAZOLAM HCL 2 MG/2ML IJ SOLN
INTRAMUSCULAR | Status: DC | PRN
  Administered 2019-11-02: 2 mg via INTRAVENOUS

## 2019-11-02 MED ORDER — FENTANYL CITRATE (PF) 100 MCG/2ML IJ SOLN: 25.0000 ug | INTRAMUSCULAR | Status: DC | PRN

## 2019-11-02 MED ORDER — DEXAMETHASONE SODIUM PHOSPHATE 10 MG/ML IJ SOLN
INTRAMUSCULAR | Status: AC
  Filled 2019-11-02: qty 1

## 2019-11-02 MED ORDER — PROPOFOL 10 MG/ML IV BOLUS
INTRAVENOUS | Status: AC
  Filled 2019-11-02: qty 20

## 2019-11-02 MED ORDER — MIDAZOLAM HCL 2 MG/2ML IJ SOLN
INTRAMUSCULAR | Status: AC
  Filled 2019-11-02: qty 2

## 2019-11-02 MED ORDER — ONDANSETRON HCL 4 MG/2ML IJ SOLN
INTRAMUSCULAR | Status: DC | PRN
  Administered 2019-11-02: 4 mg via INTRAVENOUS

## 2019-11-02 SURGICAL SUPPLY — 68 items
"PENCIL ELECTRO HAND CTR " (MISCELLANEOUS) ×1 IMPLANT
BINDER ABDOMINAL  9 SM 30-45 (SOFTGOODS) ×3
BINDER ABDOMINAL 9 SM 30-45 (SOFTGOODS) IMPLANT
BLADE OSCILLATING/SAGITTAL (BLADE)
BLADE SURG 15 STRL LF DISP TIS (BLADE) ×1 IMPLANT
BLADE SURG 15 STRL SS (BLADE) ×3
BLADE SURG MINI STRL (BLADE) ×2 IMPLANT
BLADE SW THK.38XMED LNG THN (BLADE) IMPLANT
BNDG CMPR STD VLCR NS LF 5.8X4 (GAUZE/BANDAGES/DRESSINGS) ×1
BNDG CONFORM 2 STRL LF (GAUZE/BANDAGES/DRESSINGS) ×3 IMPLANT
BNDG ELASTIC 4X5.8 VLCR NS LF (GAUZE/BANDAGES/DRESSINGS) ×3 IMPLANT
BNDG ELASTIC 4X5.8 VLCR STR LF (GAUZE/BANDAGES/DRESSINGS) ×2 IMPLANT
BNDG ESMARK 4X12 TAN STRL LF (GAUZE/BANDAGES/DRESSINGS) ×3 IMPLANT
BNDG GAUZE 4.5X4.1 6PLY STRL (MISCELLANEOUS) ×3 IMPLANT
CANISTER SUCT 1200ML W/VALVE (MISCELLANEOUS) ×1 IMPLANT
CNTNR SPEC 2.5X3XGRAD LEK (MISCELLANEOUS) ×1
CONT SPEC 4OZ STER OR WHT (MISCELLANEOUS) ×2
CONT SPEC 4OZ STRL OR WHT (MISCELLANEOUS) ×1
CONTAINER SPEC 2.5X3XGRAD LEK (MISCELLANEOUS) IMPLANT
COVER WAND RF STERILE (DRAPES) ×3 IMPLANT
CUFF TOURN SGL QUICK 12 (TOURNIQUET CUFF) IMPLANT
CUFF TOURN SGL QUICK 18X4 (TOURNIQUET CUFF) IMPLANT
DRAPE FLUOR MINI C-ARM 54X84 (DRAPES) IMPLANT
DRSG GAUZE FLUFF 36X18 (GAUZE/BANDAGES/DRESSINGS) ×2 IMPLANT
DURAPREP 26ML APPLICATOR (WOUND CARE) ×3 IMPLANT
ELECT BUTTON HF 24-28F 2 30DE (ELECTRODE) ×2 IMPLANT
ELECT REM PT RETURN 9FT ADLT (ELECTROSURGICAL) ×3
ELECTRODE REM PT RTRN 9FT ADLT (ELECTROSURGICAL) ×1 IMPLANT
GAUZE PACKING IODOFORM 1/2 (PACKING) ×2 IMPLANT
GAUZE SPONGE 4X4 12PLY STRL (GAUZE/BANDAGES/DRESSINGS) ×3 IMPLANT
GAUZE XEROFORM 1X8 LF (GAUZE/BANDAGES/DRESSINGS) ×3 IMPLANT
GLOVE BIO SURGEON STRL SZ7.5 (GLOVE) ×3 IMPLANT
GLOVE INDICATOR 8.0 STRL GRN (GLOVE) ×3 IMPLANT
GOWN STRL REUS W/ TWL LRG LVL3 (GOWN DISPOSABLE) ×2 IMPLANT
GOWN STRL REUS W/TWL LRG LVL3 (GOWN DISPOSABLE) ×6
HANDPIECE VERSAJET DEBRIDEMENT (MISCELLANEOUS) ×3 IMPLANT
IV LACTATED RINGER IRRG 3000ML (IV SOLUTION) ×6
IV LR IRRIG 3000ML ARTHROMATIC (IV SOLUTION) IMPLANT
KIT TURNOVER KIT A (KITS) ×3 IMPLANT
LABEL OR SOLS (LABEL) ×3 IMPLANT
MANIFOLD NEPTUNE II (INSTRUMENTS) ×2 IMPLANT
NDL FILTER BLUNT 18X1 1/2 (NEEDLE) ×1 IMPLANT
NDL HYPO 25X1 1.5 SAFETY (NEEDLE) ×3 IMPLANT
NEEDLE FILTER BLUNT 18X 1/2SAF (NEEDLE) ×2
NEEDLE FILTER BLUNT 18X1 1/2 (NEEDLE) ×1 IMPLANT
NEEDLE HYPO 25X1 1.5 SAFETY (NEEDLE) ×9 IMPLANT
NS IRRIG 500ML POUR BTL (IV SOLUTION) ×3 IMPLANT
PACK EXTREMITY (MISCELLANEOUS) ×3 IMPLANT
PAD ABD DERMACEA PRESS 5X9 (GAUZE/BANDAGES/DRESSINGS) ×6 IMPLANT
PENCIL ELECTRO HAND CTR (MISCELLANEOUS) ×3 IMPLANT
PULSAVAC PLUS IRRIG FAN TIP (DISPOSABLE) ×3
RASP SM TEAR CROSS CUT (RASP) IMPLANT
SOL PREP PVP 2OZ (MISCELLANEOUS) ×3
SOLUTION PREP PVP 2OZ (MISCELLANEOUS) ×1 IMPLANT
STOCKINETTE STRL 6IN 960660 (GAUZE/BANDAGES/DRESSINGS) ×3 IMPLANT
SUT ETHILON 3-0 FS-10 30 BLK (SUTURE) ×3
SUT ETHILON 4-0 (SUTURE) ×3
SUT ETHILON 4-0 FS2 18XMFL BLK (SUTURE) ×1
SUT VIC AB 3-0 SH 27 (SUTURE) ×3
SUT VIC AB 3-0 SH 27X BRD (SUTURE) ×1 IMPLANT
SUT VIC AB 4-0 FS2 27 (SUTURE) ×3 IMPLANT
SUTURE EHLN 3-0 FS-10 30 BLK (SUTURE) ×1 IMPLANT
SUTURE ETHLN 4-0 FS2 18XMF BLK (SUTURE) ×1 IMPLANT
SWAB CULTURE AMIES ANAERIB BLU (MISCELLANEOUS) ×2 IMPLANT
SYR 10ML LL (SYRINGE) ×6 IMPLANT
SYR 3ML LL SCALE MARK (SYRINGE) ×3 IMPLANT
SYR TOOMEY 50ML (SYRINGE) ×2 IMPLANT
TIP FAN IRRIG PULSAVAC PLUS (DISPOSABLE) IMPLANT

## 2019-11-02 NOTE — Consult Note (Signed)
WOC Nurse Consult Note: Reason for Consult: Foot wound Wound type: Infectious  WOC Nursing is simultaneously consulted with Podiatric Medicine and Dr. Alberteen Spindle has seen and taken patient to OR this morning.  WOC nursing team will not follow, but will remain available to this patient, the nursing and medical teams.  Please re-consult if needed. Thanks, Ladona Mow, MSN, RN, GNP, [place]  Pager# [phone]

## 2019-11-02 NOTE — Interval H&P Note (Signed)
History and Physical Interval Note:  11/02/2019 9:46 AM  Reginald Nielsen  has presented today for surgery, with the diagnosis of Left Foot.  The various methods of treatment have been discussed with the patient and family. After consideration of risks, benefits and other options for treatment, the patient has consented to  Procedure(s): IRRIGATION AND DEBRIDEMENT FOOT (Left) as a surgical intervention.  The patient's history has been reviewed, patient examined, no change in status, stable for surgery.  I have reviewed the patient's chart and labs.  Questions were answered to the patient's satisfaction.     [name]

## 2019-11-02 NOTE — Progress Notes (Signed)
[facility] Progress Note  Patient: Reginald Nielsen    ZOX:[id number]  DOA: 11/01/2019     Date of Service: the patient was seen and examined on 11/02/2019  Brief hospital course: Reginald Nielsen is a 59 y.o. male with medical history significant of hypertension, hyperlipidemia, diabetes mellitus, PVD, glaucoma, who presents with left foot infection.  Currently plan is monitor postop recovery.  Follow-up on cultures.  May require IV antibiotics 4 weeks.  Assessment and Plan: Left foot wound infection with possible cellulitis: MRI of left foot showed possible osteomyelitis.  Patient failed outpatient treatment including antibiotic treatment.  No fever or leukocytosis.  Clinically not septic. Dr. Alberteen Spindle of podiatry is consulted. Underwent IND. We will follow up on cultures. Continue with current antibiotics. We will consult ID once more information is available about cultures.  DM (diabetes mellitus), type 2 with peripheral vascular complications [name]:  Recent A1c 5.3, well controlled.  Patient is taking Metformin and glipizide -SSI  History of PAD (peripheral artery disease) (HCC): -Hold Plavix since patient may need surgery -Continue aspirin and Lipitor  HLD (hyperlipidemia) -Lipitor  Hypertension: pt is not taking medications currently.  Blood pressure 122/69 -IV hydralazine as needed  Diet: N.p.o. for now.  Cardiac diet after that DVT Prophylaxis:   SCDs Start: 11/01/19 1329  Advance goals of care discussion: Full code  Family Communication: no family was present at bedside, at the time of interview.   Disposition:  Status is: Inpatient  Remains inpatient appropriate because:IV treatments appropriate due to intensity of illness or inability to take PO  Dispo: The patient is from: Home              Anticipated d/c is to: Home              Anticipated d/c date is: 3 days              Patient currently is not medically stable to d/c.  Subjective: No  nausea no vomiting.  Pain still present but well controlled.  Physical Exam:  General: Appear in mild distress, no Rash; Oral Mucosa Clear, moist. no Abnormal Neck Mass Or lumps, Conjunctiva normal  Cardiovascular: S1 and S2 Present, no Murmur, Respiratory: good respiratory effort, Bilateral Air entry present and CTA, no Crackles, no wheezes Abdomen: Bowel Sound present, Soft and no tenderness Extremities: no Pedal edema Neurology: alert and oriented to time, place, and person affect appropriate. no new focal deficit Gait not checked due to patient safety concerns  Vitals:   11/02/19 1319 11/02/19 1455 11/02/19 1724 11/02/19 1925  BP: 114/79 122/72 125/78 123/72  Pulse: 67 70 69 66  Resp: 16 15 16 18   Temp: 99.4 F (37.4 C) 97.9 F (36.6 C) 97.9 F (36.6 C) (!) 97.5 F (36.4 C)  TempSrc:   Oral Oral  SpO2: 99% 98% 99% 99%  Weight:      Height:        Intake/Output Summary (Last 24 hours) at 11/02/2019 2015 Last data filed at 11/02/2019 1800 Gross per 24 hour  Intake 1510 ml  Output 1210 ml  Net 300 ml   Filed Weights   11/01/19 1139  Weight: 99.8 kg    Data Reviewed: I have personally reviewed and interpreted daily labs, tele strips, imagings as discussed above. I reviewed all nursing notes, pharmacy notes, vitals, pertinent old records I have discussed plan of care as described above with RN and patient/family.  CBC: Recent Labs  Lab 11/01/19 1142 11/02/19 0640  WBC 10.4 8.1  NEUTROABS 7.7  --   HGB 14.7 13.7  HCT 44.7 41.1  MCV 84.8 84.4  PLT 206 186   Basic Metabolic Panel: Recent Labs  Lab 11/01/19 1142 11/02/19 0640  NA 135 135  K 4.2 4.1  CL 99 101  CO2 25 24  GLUCOSE 140* 126*  BUN 23* 16  CREATININE 0.92 1.14  CALCIUM 9.4 8.9    Studies: DG MINI C-ARM IMAGE ONLY  Result Date: 11/02/2019 There is no interpretation for this exam.  This order is for images obtained during a surgical procedure.  Please See "Surgeries" Tab for more  information regarding the procedure.    Scheduled Meds: . aspirin EC  81 mg Oral Daily  . atorvastatin  10 mg Oral Daily  . brimonidine  1 drop Both Eyes BID   And  . timolol  1 drop Both Eyes BID  . influenza vac split quadrivalent PF  0.5 mL Intramuscular Tomorrow-1000  . insulin aspart  0-5 Units Subcutaneous QHS  . insulin aspart  0-9 Units Subcutaneous TID WC  . latanoprost  1 drop Both Eyes QHS  . pneumococcal 23 valent vaccine  0.5 mL Intramuscular Tomorrow-1000   Continuous Infusions: . cefTRIAXone (ROCEPHIN)  IV 1 g (11/02/19 1441)   PRN Meds: acetaminophen, hydrALAZINE, morphine injection, ondansetron (ZOFRAN) IV, oxyCODONE-acetaminophen  Time spent: 35 minutes  Author: Lynden Oxford, MD [facility] 11/02/2019 8:15 PM  To reach On-call, see care teams to locate the attending and reach out via [email or web address]. Between 7PM-7AM, please contact night-coverage If you still have difficulty reaching the attending provider, please page the [facility] (Director on Call) for [facility] on amion for assistance.

## 2019-11-02 NOTE — Transfer of Care (Signed)
Immediate Anesthesia Transfer of Care Note  Patient: Reginald Nielsen  Procedure(s) Performed: IRRIGATION AND DEBRIDEMENT FOOT (Left Foot)  Patient Location: PACU  Anesthesia Type:General  Level of Consciousness: awake, alert  and oriented  Airway & Oxygen Therapy: Patient Spontanous Breathing and Patient connected to face mask oxygen  Post-op Assessment: Report given to RN and Post -op Vital signs reviewed and stable  Post vital signs: Reviewed and stable  Last Vitals:  Vitals Value Taken Time  BP 102/68 11/02/19 1150  Temp 36 C 11/02/19 1150  Pulse 69 11/02/19 1154  Resp 13 11/02/19 1154  SpO2 100 % 11/02/19 1154  Vitals shown include unvalidated device data.  Last Pain:  Vitals:   11/02/19 1150  TempSrc:   PainSc: Asleep         Complications: No complications documented.

## 2019-11-02 NOTE — Op Note (Signed)
Date of operation: 11/02/2019.  Surgeon: [name] D.P.M.  Preoperative diagnosis: Abscess left forefoot with osteomyelitis tibial sesamoid.  Postoperative diagnosis: Same.  Procedure: Incision and drainage left foot with debridement tibial sesamoid.  Anesthesia: LMA.  Hemostasis: None.  Estimated blood loss: 15 cc.  Injectables: 10 cc 0.5% Marcaine plain.  Cultures: 1.  Deep wound culture abscess left foot. 2.  Bone culture tibial sesamoid left foot.  Pathology: Tibial sesamoid left foot.  Complications: Minimal bleeding experienced during the procedure.  Drains: Half-inch iodoform gauze.  Operative indications: This is a 59 year old male with a 11-month history of an ulceration on his left foot.  Previously underwent revascularization for PVD for the left lower extremity.  This week experienced significant increased redness and drainage and decision was made for admission to the hospital for IV antibiotics and debridement.  MRI revealed osteomyelitis of the tibial sesamoid.  Operative procedure: Patient was taken to the operating room and placed on the table in the supine position.  Following satisfactory LMA anesthesia the left foot was prepped and draped in the usual sterile fashion.  Attention was then directed to the plantar aspect of the left foot where the ulceration and abscess was noted beneath the first metatarsal.  An incision was made through the ulceration and extended approximately 2.5 cm distally and 3 cm proximally.  Incision was deepened down to the level of the joint.  Heavy necrotic tissue was noted at the base of the ulceration and extending some slightly medially but more so laterally towards the second interspace.  Sharp debridement was performed using a rongeur down to the level of joint and bone.  The wound was then thoroughly debrided with a versa jet debrider on a setting of 3-5.  Again there was noted to be minimal bleeding at the surgical site.  At this  point an incision was made along the plantar medial aspect of the joint exposing the tibial sesamoid which was then carefully dissected out of the tendon.  The distal segment was sent for culture and the proximal segment sent for pathology.  The wound and joint was then flushed using 3 L of pulsed irrigation.  Capsular and tendon reapproximated using 3-0 Vicryl suture.  The wound was again reirrigated with saline and then closed using 3-0 nylon simple interrupted and figure-of-eight suture.  A small central portion from the original ulceration was left open and packed using iodoform gauze.  Xeroform 4 x 4's ABDs Kerlix and an Ace applied to the left lower extremity.  The patient was awakened and transported to the PACU with vital signs stable and in good condition.

## 2019-11-02 NOTE — Anesthesia Procedure Notes (Addendum)
Procedure Name: LMA Insertion Date/Time: 11/02/2019 10:47 AM Performed by: Katherine Basset, CRNA Pre-anesthesia Checklist: Patient identified, Emergency Drugs available, Suction available and Patient being monitored Patient Re-evaluated:Patient Re-evaluated prior to induction Oxygen Delivery Method: Circle system utilized Preoxygenation: Pre-oxygenation with 100% oxygen Induction Type: IV induction Ventilation: Mask ventilation without difficulty LMA: LMA inserted LMA Size: 4.5 Number of attempts: 1 Tube secured with: Tape Dental Injury: Teeth and Oropharynx as per pre-operative assessment

## 2019-11-02 NOTE — Anesthesia Preprocedure Evaluation (Addendum)
Anesthesia Evaluation  Patient identified by MRN, date of birth, ID band Patient awake    Reviewed: Allergy & Precautions, NPO status , Patient's Chart, lab work & pertinent test results  History of Anesthesia Complications Negative for: history of anesthetic complications  Airway Mallampati: II  TM Distance: >3 FB Neck ROM: Full    Dental  (+)    Pulmonary neg pulmonary ROS,    breath sounds clear to auscultation       Cardiovascular hypertension, (-) angina+ Peripheral Vascular Disease  (-) DOE  Rhythm:Regular Rate:Normal   HLD   Neuro/Psych negative psych ROS   GI/Hepatic negative GI ROS, Neg liver ROS, neg GERD  ,  Endo/Other  diabetes  Renal/GU negative Renal ROS  negative genitourinary   Musculoskeletal  (+) Arthritis , Osteoarthritis,    Abdominal   Peds negative pediatric ROS (+)  Hematology negative hematology ROS (+)   Anesthesia Other Findings Past Medical History: No date: Diabetes mellitus without complication (HCC)     Comment:  type 2 No date: Glaucoma No date: Hyperlipidemia No date: Hypertension No date: Shoulder pain, left  Reproductive/Obstetrics                             Anesthesia Physical  Anesthesia Plan  ASA: II  Anesthesia Plan: General   Post-op Pain Management:    Induction: Intravenous  PONV Risk Score and Plan: 2  Airway Management Planned: LMA  Additional Equipment:   Intra-op Plan:   Post-operative Plan: Extubation in OR  Informed Consent: I have reviewed the patients History and Physical, chart, labs and discussed the procedure including the risks, benefits and alternatives for the proposed anesthesia with the patient or authorized representative who has indicated his/her understanding and acceptance.       Plan Discussed with: CRNA and Anesthesiologist  Anesthesia Plan Comments:        Anesthesia Quick Evaluation

## 2019-11-03 DIAGNOSIS — L089 Local infection of the skin and subcutaneous tissue, unspecified: Secondary | ICD-10-CM | POA: Diagnosis not present

## 2019-11-03 LAB — GLUCOSE, CAPILLARY
Glucose-Capillary: 152 mg/dL — ABNORMAL HIGH (ref 70–99)
Glucose-Capillary: 157 mg/dL — ABNORMAL HIGH (ref 70–99)
Glucose-Capillary: 123 mg/dL — ABNORMAL HIGH (ref 70–99)
Glucose-Capillary: 114 mg/dL — ABNORMAL HIGH (ref 70–99)

## 2019-11-03 LAB — CBC
HCT: 42.3 % (ref 39.0–52.0)
Hemoglobin: 13.9 g/dL (ref 13.0–17.0)
MCH: 27.6 pg (ref 26.0–34.0)
MCHC: 32.9 g/dL (ref 30.0–36.0)
MCV: 84.1 fL (ref 80.0–100.0)
Platelets: 224 10*3/uL (ref 150–400)
RBC: 5.03 MIL/uL (ref 4.22–5.81)
RDW: 12.7 % (ref 11.5–15.5)
WBC: 9.9 10*3/uL (ref 4.0–10.5)
nRBC: 0 % (ref 0.0–0.2)

## 2019-11-03 LAB — BASIC METABOLIC PANEL
Anion gap: 9 (ref 5–15)
BUN: 20 mg/dL (ref 6–20)
CO2: 28 mmol/L (ref 22–32)
Calcium: 9.1 mg/dL (ref 8.9–10.3)
Chloride: 99 mmol/L (ref 98–111)
Creatinine, Ser: 0.82 mg/dL (ref 0.61–1.24)
GFR, Estimated: >60 mL/min (ref >60)
Glucose, Bld: 162 mg/dL — ABNORMAL HIGH (ref 70–99)
Potassium: 4.4 mmol/L (ref 3.5–5.1)
Sodium: 136 mmol/L (ref 135–145)

## 2019-11-03 MED ORDER — GABAPENTIN 100 MG PO CAPS
100.0000 mg | ORAL_CAPSULE | Freq: Three times a day (TID) | ORAL | Status: DC
  Administered 2019-11-03 – 2019-11-09 (×17): 100 mg via ORAL
  Filled 2019-11-03 (×17): qty 1

## 2019-11-03 NOTE — Consult Note (Signed)
[facility] Vascular Consult Note  MRN : [id number]  Reginald Nielsen is a 59 y.o. (02/06/1960) male who presents with chief complaint of  Chief Complaint  Patient presents with  . Wound Infection  .  History of Present Illness: Patient known to service. Left foot ulcer and claudication. S/P Left AT/peroneal intervention in August. The patient initially noted improvement in the left leg pain and ulcer, however, in the last few weeks he has noted increased pain in the leg/foot and worsening of the left foot ulcer. Has been compliant with ASA/Plavix. Denies rest pain. Now s/p Left foot I&D per Dr. Cline yesterday. Intraoperatively, there was minimal bleeding from wound bed with concern of progressive ischemia.  Current Facility-Administered Medications  Medication Dose Route Frequency Provider Last Rate Last Admin  . acetaminophen (TYLENOL) tablet 650 mg  650 mg Oral Q6H PRN Niu, Xilin, MD      . aspirin EC tablet 81 mg  81 mg Oral Daily Niu, Xilin, MD   81 mg at 11/03/19 0948  . atorvastatin (LIPITOR) tablet 10 mg  10 mg Oral Daily Niu, Xilin, MD   10 mg at 11/03/19 0947  . brimonidine (ALPHAGAN) 0.2 % ophthalmic solution 1 drop  1 drop Both Eyes BID Niu, Xilin, MD   1 drop at 11/03/19 0948   And  . timolol (TIMOPTIC) 0.5 % ophthalmic solution 1 drop  1 drop Both Eyes BID Niu, Xilin, MD   1 drop at 11/03/19 0947  . cefTRIAXone (ROCEPHIN) 1 g in sodium chloride 0.9 % 100 mL IVPB  1 g Intravenous Q24H Niu, Xilin, MD 200 mL/hr at 11/02/19 1441 1 g at 11/02/19 1441  . gabapentin (NEURONTIN) capsule 100 mg  100 mg Oral TID Patel, Pranav M, MD      . hydrALAZINE (APRESOLINE) injection 5 mg  5 mg Intravenous Q2H PRN Niu, Xilin, MD      . influenza vac split quadrivalent PF (FLUARIX) injection 0.5 mL  0.5 mL Intramuscular Tomorrow-1000 Niu, Xilin, MD      . insulin aspart (novoLOG) injection 0-5 Units  0-5 Units Subcutaneous QHS Niu, Xilin, MD   2 Units at 11/02/19 2112  .  insulin aspart (novoLOG) injection 0-9 Units  0-9 Units Subcutaneous TID WC Niu, Xilin, MD   2 Units at 11/03/19 0946  . latanoprost (XALATAN) 0.005 % ophthalmic solution 1 drop  1 drop Both Eyes QHS Niu, Xilin, MD   1 drop at 11/02/19 2113  . morphine 2 MG/ML injection 2 mg  2 mg Intravenous Q4H PRN Niu, Xilin, MD   2 mg at 11/02/19 2340  . ondansetron (ZOFRAN) injection 4 mg  4 mg Intravenous Q8H PRN Niu, Xilin, MD      . oxyCODONE-acetaminophen (PERCOCET/ROXICET) 5-325 MG per tablet 1-2 tablet  1-2 tablet Oral Q4H PRN Cline, Todd, DPM   2 tablet at 11/03/19 0613  . pneumococcal 23 valent vaccine (PNEUMOVAX-23) injection 0.5 mL  0.5 mL Intramuscular Tomorrow-1000 Niu, Xilin, MD        Past Medical History:  Diagnosis Date  . Diabetes mellitus without complication (HCC)    type 2  . Glaucoma   . Hyperlipidemia   . Hypertension   . Shoulder pain, left     Past Surgical History:  Procedure Laterality Date  . COLONOSCOPY WITH PROPOFOL N/A 04/08/2019   Procedure: COLONOSCOPY WITH PROPOFOL;  Surgeon: Wohl, Darren, MD;  Location: [facility];  Service: Endoscopy;  Laterality: N/A;  . frozen shoulder    .   LOWER EXTREMITY ANGIOGRAPHY Left 09/03/2019   Procedure: LOWER EXTREMITY ANGIOGRAPHY;  Surgeon: Renford DillsSchnier, Gregory G, MD;  Location: [facility];  Service: Cardiovascular;  Laterality: Left;  . SHOULDER SURGERY     2015 and 2016    Social History Social History   Tobacco Use  . Smoking status: Never Smoker  . Smokeless tobacco: Current User    Types: Chew  Vaping Use  . Vaping Use: Never used  Substance Use Topics  . Alcohol use: Not Currently    Comment: quit 11/2017  . Drug use: Not Currently    Family History Family History  Problem Relation Age of Onset  . Diabetes Father   . Cancer Father        lung  . COPD Father   . Heart attack Father   . Stroke Father   . Diabetes Paternal Aunt     No Known Allergies   REVIEW OF SYSTEMS (Negative unless  checked)  Constitutional: [] Weight loss  [] Fever  [] Chills Cardiac: [] Chest pain   [] Chest pressure   [] Palpitations   [] Shortness of breath when laying flat   [] Shortness of breath at rest   [] Shortness of breath with exertion. Vascular:  [] Pain in legs with walking   [] Pain in legs at rest   [] Pain in legs when laying flat   [x] Claudication   [x] Pain in feet when walking  [] Pain in feet at rest  [] Pain in feet when laying flat   [] History of DVT   [] Phlebitis   [] Swelling in legs   [] Varicose veins   [] Non-healing ulcers Pulmonary:   [] Uses home oxygen   [] Productive cough   [] Hemoptysis   [] Wheeze  [] COPD   [] Asthma Neurologic:  [] Dizziness  [] Blackouts   [] Seizures   [] History of stroke   [] History of TIA  [] Aphasia   [] Temporary blindness   [] Dysphagia   [] Weakness or numbness in arms   [] Weakness or numbness in legs Musculoskeletal:  [] Arthritis   [] Joint swelling   [] Joint pain   [] Low back pain Hematologic:  [] Easy bruising  [] Easy bleeding   [] Hypercoagulable state   [] Anemic  [] Hepatitis Gastrointestinal:  [] Blood in stool   [] Vomiting blood  [] Gastroesophageal reflux/heartburn   [] Difficulty swallowing. Genitourinary:  [] Chronic kidney disease   [] Difficult urination  [] Frequent urination  [] Burning with urination   [] Blood in urine Skin:  [] Rashes   [] Ulcers   [] Wounds Psychological:  [] History of anxiety   []  History of major depression.  Physical Examination  Vitals:   11/02/19 1925 11/02/19 2346 11/03/19 0410 11/03/19 1156  BP: 123/72 128/79 105/67 126/71  Pulse: 66 (!) 58 (!) 56 64  Resp: 18 20 18 16   Temp: (!) 97.5 F (36.4 C) (!) 97.5 F (36.4 C) (!) 97.3 F (36.3 C) 97.6 F (36.4 C)  TempSrc: Oral Oral Oral Oral  SpO2: 99% 100% 96% 100%  Weight:      Height:       Body mass index is 26.09 kg/m. Gen:  WD/WN, NAD Neck: Trachea midline.  No JVD.  Pulmonary:  Good air movement, respirations not labored, equal bilaterally.  Cardiac: RRR, normal S1, S2. Vascular:   Vessel Right Left  Radial Palpable Palpable  Ulnar Palpable Palpable  Brachial Palpable Palpable  Carotid Palpable, without bruit Palpable, without bruit  Aorta Not palpable N/A  Femoral Palpable Palpable  Popliteal warm    Warm to toes Surgical dressing/warm to ankle  Gastrointestinal: soft, non-tender/non-distended. No guarding/reflex.  Musculoskeletal: M/S 5/5 throughout.  Extremities  without ischemic changes.  No deformity or atrophy. No edema. Neurologic: Sensation grossly intact in extremities.  Symmetrical.  Speech is fluent. Motor exam as listed above. Psychiatric: Judgment intact, Mood & affect appropriate for pt's clinical situation.       CBC Lab Results  Component Value Date   WBC 9.9 11/03/2019   HGB 13.9 11/03/2019   HCT 42.3 11/03/2019   MCV 84.1 11/03/2019   PLT 224 11/03/2019    BMET    Component Value Date/Time   NA 136 11/03/2019 0840   NA 140 02/13/2019 0953   K 4.4 11/03/2019 0840   CL 99 11/03/2019 0840   CO2 28 11/03/2019 0840   GLUCOSE 162 (H) 11/03/2019 0840   BUN 20 11/03/2019 0840   BUN 15 02/13/2019 0953   CREATININE 0.82 11/03/2019 0840   CALCIUM 9.1 11/03/2019 0840   GFRNONAA >60 11/03/2019 0840   GFRAA >60 09/03/2019 0925   Estimated Creatinine Clearance: 123.8 mL/min (by C-G formula based on SCr of 0.82 mg/dL).  COAG Lab Results  Component Value Date   INR 1.1 11/01/2019    Radiology MR FOOT LEFT WO CONTRAST  Result Date: 11/01/2019 CLINICAL DATA:  Diabetic foot wound at the base of the left great toe and cellulitis EXAM: MRI OF THE LEFT FOOT WITHOUT CONTRAST TECHNIQUE: Multiplanar, multisequence MR imaging of the left forefoot was performed. No intravenous contrast was administered. COMPARISON:  Left foot x-ray 11/01/2019 FINDINGS: Bones/Joint/Cartilage Bone marrow edema within the medial hallux sesamoid with patchy low T1 marrow signal suggestive of osteomyelitis (series 3 and 5, images 21-25). Marrow signal of the lateral  hallux sesamoid and first metatarsal head are within normal limits. The remaining osseous structures are normal in signal. No acute fracture. No dislocation. No joint effusion. No significant arthropathy. Ligaments Intact Lisfranc ligament. Collateral ligaments of the forefoot are intact. Muscles and Tendons Diffuse edema-like T2 signal throughout the intrinsic musculature which may represent myositis and/or denervation changes. Intact flexor and extensor tendons. No tenosynovitis. Soft tissues Superficial plantar foot ulceration underlying the first metatarsal head with associated soft tissue thickening and soft tissue edema. Multiple foci of susceptibility within the plantar soft tissues underlying the proximal phalanx of the great toe. No organized fluid collection. Mild diffuse subcutaneous edema. There is nodular thickening of the plantar fascia in two locations, one of which is at the level of the mid first metatarsal diaphysis and additional fusiform thickening at the level of the first TMT joint (series 5, images 33 and 40). IMPRESSION: 1. Bone marrow signal changes within the medial hallux sesamoid suspicious for osteomyelitis. No additional sites of marrow signal abnormality within the great toe or remaining forefoot. 2. Plantar foot ulceration underlying the first metatarsal head with associated soft tissue thickening and soft tissue edema. Multiple foci of susceptibility within the plantar soft tissues underlying the proximal phalanx of the great toe which may reflect air tracking from the adjacent ulceration or possibly from a gas-forming infection. 3. Diffuse edema-like signal throughout the intrinsic musculature of the forefoot which may represent myositis and/or denervation changes. 4. Findings of plantar fibromatosis. Electronically Signed   By: Duanne Guess D.O.   On: 11/01/2019 13:54   DG Foot Complete Left  Result Date: 11/01/2019 CLINICAL DATA:  LEFT foot pain for 1 month, infected  ulcer medially, diabetes mellitus, hypertension EXAM: LEFT FOOT - COMPLETE 3+ VIEW COMPARISON:  None FINDINGS: Osseous mineralization normal. Joint spaces preserved. Dressing artifacts overlie the medial aspect of the LEFT foot extending from  the mid first metatarsal to the mid proximal phalanx great toe. No acute fracture, dislocation, or bone destruction. Small vessel vascular calcifications at ankle and distal lower leg. IMPRESSION: No acute osseous abnormalities. If there is persistent clinical concern for osteomyelitis or deep soft tissue infection consider MR. Electronically Signed   By: Ulyses Southward M.D.   On: 11/01/2019 12:31   DG MINI C-ARM IMAGE ONLY  Result Date: 11/02/2019 There is no interpretation for this exam.  This order is for images obtained during a surgical procedure.  Please See "Surgeries" Tab for more information regarding the procedure.   VAS [place] ABI WITH/WO TBI  Result Date: 10/10/2019 LOWER EXTREMITY DOPPLER STUDY Indications: Ulceration.  Comparison Study: 08/16/2019 Performing Technologist: Salvadore Farber RVT  Examination Guidelines: A complete evaluation includes at minimum, Doppler waveform signals and systolic blood pressure reading at the level of bilateral brachial, anterior tibial, and posterior tibial arteries, when vessel segments are accessible. Bilateral testing is considered an integral part of a complete examination. Photoelectric Plethysmograph (PPG) waveforms and toe systolic pressure readings are included as required and additional duplex testing as needed. Limited examinations for reoccurring indications may be performed as noted.  ABI Findings: +---------+------------------+-----+--------+--------+ Right    Rt Pressure (mmHg)IndexWaveformComment  +---------+------------------+-----+--------+--------+ Brachial 132                                     +---------+------------------+-----+--------+--------+ ATA                                     NonComp   +---------+------------------+-----+--------+--------+ PTA      102               0.77 biphasic         +---------+------------------+-----+--------+--------+ Great Toe72                0.55 Abnormal         +---------+------------------+-----+--------+--------+ +---------+------------------+-----+--------+-------+ Left     Lt Pressure (mmHg)IndexWaveformComment +---------+------------------+-----+--------+-------+ Brachial 132                                    +---------+------------------+-----+--------+-------+ ATA      178               1.35 biphasic        +---------+------------------+-----+--------+-------+ PTA      77                0.58 biphasic        +---------+------------------+-----+--------+-------+ Great Toe78                0.59 Normal          +---------+------------------+-----+--------+-------+ +-------+-----------+-----------+------------+------------+ ABI/TBIToday's ABIToday's TBIPrevious ABIPrevious TBI +-------+-----------+-----------+------------+------------+ Right  .77        .55        .60         .82          +-------+-----------+-----------+------------+------------+ Left   1.35       .59        .82         .49          +-------+-----------+-----------+------------+------------+ Left ABIs and TBIs appear increased compared to prior study on 08/16/2019.  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 The right toe-brachial index is abnormal. Left: Resting left ankle-brachial index is within normal range. No evidence of significant left lower extremity arterial disease. The left toe-brachial index is abnormal.  *See table(s) above for measurements and observations.  Electronically signed by Levora Dredge MD on 10/10/2019 at 10:56:29 AM.   Final       Assessment/Plan 1. Nonhealing LEFT foot Ulcer/Abscess, progressive claudication Left leg 2. Will plan for  angiogram to assess perfusion/possible intervention of the left lower extremity/foot on Tuesday per Dr. Gilda Crease.    Bertram Denver, MD  11/03/2019 12:46 PM    This note was created with Dragon medical transcription system.  Any error is purely unintentional

## 2019-11-03 NOTE — Anesthesia Procedure Notes (Signed)
Epidural

## 2019-11-03 NOTE — Anesthesia Postprocedure Evaluation (Signed)
Anesthesia Post Note  Patient: Reginald Nielsen  Procedure(s) Performed: IRRIGATION AND DEBRIDEMENT FOOT (Left Foot)  Patient location during evaluation: PACU Anesthesia Type: General Level of consciousness: awake and alert and oriented Pain management: pain level controlled Vital Signs Assessment: post-procedure vital signs reviewed and stable Respiratory status: spontaneous breathing Cardiovascular status: blood pressure returned to baseline Anesthetic complications: no   No complications documented.   Last Vitals:  Vitals:   11/02/19 2346 11/03/19 0410  BP: 128/79 105/67  Pulse: (!) 58 (!) 56  Resp: 20 18  Temp: (!) 36.4 C (!) 36.3 C  SpO2: 100% 96%    Last Pain:  Vitals:   11/03/19 0713  TempSrc:   PainSc: 2                  Toby Ayad

## 2019-11-03 NOTE — Progress Notes (Signed)
[facility] Progress Note  Patient: Reginald Nielsen    CZY:[id number]  DOA: 11/01/2019     Date of Service: the patient was seen and examined on 11/03/2019  Brief hospital course: Reginald Nielsen a 59 y.o.malewith medical history significant ofhypertension, hyperlipidemia, diabetes mellitus, PVD, glaucoma, who presents with left foot infection.  Currently plan is postop recovery and modification of IV antibiotics.  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  Patient failed outpatient treatment includingantibiotic treatment.  No fever or leukocytosis. Clinically not septic.  Dr. Alberteen Spindle ofpodiatry is consulted. Underwent incision and drainage with debridement. We will follow up on cultures. Continue with current antibiotics. We will consult ID once more information is available about cultures.  DM (diabetes mellitus), type 2 with peripheral vascular complications [facility]): Reported neuropathy pain bilaterally. Recent A1c 5.3, well controlled. Patient is taking Metformin and glipizide -SSI Add gabapentin 300 mg.  100 mg 3 times daily.  History ofPAD (peripheral artery disease) [facility]): Recent angiography August 2021 PTCA of left anterior tibial and peroneal arteries. Poor circulation seen at the time of the debridement Vascular surgery consulted. Scheduled for repeat angiography on Monday.  HLD (hyperlipidemia) -Lipitor  Hypertension: Pt is not taking medications currently.  -IV hydralazine as needed  Hyperlipidemia. Continue Lipitor.  Diet: Cardiac diet DVT Prophylaxis:   SCDs Start: 11/01/19 1329   Advance goals of care discussion: Full code  Family Communication: no family was present at bedside, at the time of interview.   Disposition:  Status is: Inpatient  Remains inpatient appropriate because:Ongoing diagnostic testing needed  not appropriate for outpatient work up   Dispo: The patient is from: Home              Anticipated d/c is to: Home              Anticipated d/c date is: > 3 days              Patient currently is not medically stable to d/c.  Subjective: Pain still present.  Patient reports generalized lower extremity burning pain as well as pain on his plantar surface both legs.  No nausea no vomiting but no fever no chills, no chest pain.  No abdominal pain.  Physical Exam:  General: Appear in mild distress, no Rash; Oral Mucosa Clear, moist. no Abnormal Neck Mass Or lumps, Conjunctiva normal  Cardiovascular: S1 and S2 Present, no Murmur, Respiratory: good respiratory effort, Bilateral Air entry present and CTA, no Crackles, no wheezes Abdomen: Bowel Sound present, Soft and no tenderness Extremities: no Pedal edema Neurology: alert and oriented to time, place, and person affect appropriate. no new focal deficit Gait not checked due to patient safety concerns  Vitals:   11/02/19 1925 11/02/19 2346 11/03/19 0410 11/03/19 1156  BP: 123/72 128/79 105/67 126/71  Pulse: 66 (!) 58 (!) 56 64  Resp: 18 20 18 16   Temp: (!) 97.5 F (36.4 C) (!) 97.5 F (36.4 C) (!) 97.3 F (36.3 C) 97.6 F (36.4 C)  TempSrc: Oral Oral Oral Oral  SpO2: 99% 100% 96% 100%  Weight:      Height:        Intake/Output Summary (Last 24 hours) at 11/03/2019 1541 Last data filed at 11/03/2019 1100 Gross per 24 hour  Intake 1230 ml  Output 2750 ml  Net -1520 ml   Filed Weights   11/01/19 1139  Weight: 99.8 kg  Data Reviewed: I have personally reviewed and interpreted daily labs, tele strips, imagings as discussed above. I reviewed all nursing notes, pharmacy notes, vitals, pertinent old records I have discussed plan of care as described above with RN and patient/family.  CBC: Recent Labs  Lab 11/01/19 1142 11/02/19 0640 11/03/19 0840  WBC 10.4 8.1 9.9  NEUTROABS 7.7  --   --   HGB 14.7 13.7 13.9  HCT 44.7  41.1 42.3  MCV 84.8 84.4 84.1  PLT 206 186 224   Basic Metabolic Panel: Recent Labs  Lab 11/01/19 1142 11/02/19 0640 11/03/19 0840  NA 135 135 136  K 4.2 4.1 4.4  CL 99 101 99  CO2 25 24 28   GLUCOSE 140* 126* 162*  BUN 23* 16 20  CREATININE 0.92 1.14 0.82  CALCIUM 9.4 8.9 9.1    Studies: No results found.  Scheduled Meds: . aspirin EC  81 mg Oral Daily  . atorvastatin  10 mg Oral Daily  . brimonidine  1 drop Both Eyes BID   And  . timolol  1 drop Both Eyes BID  . gabapentin  100 mg Oral TID  . influenza vac split quadrivalent PF  0.5 mL Intramuscular Tomorrow-1000  . insulin aspart  0-5 Units Subcutaneous QHS  . insulin aspart  0-9 Units Subcutaneous TID WC  . latanoprost  1 drop Both Eyes QHS  . pneumococcal 23 valent vaccine  0.5 mL Intramuscular Tomorrow-1000   Continuous Infusions: . cefTRIAXone (ROCEPHIN)  IV 1 g (11/03/19 1311)   PRN Meds: acetaminophen, hydrALAZINE, morphine injection, ondansetron (ZOFRAN) IV, oxyCODONE-acetaminophen  Time spent: 35 minutes  Author: 11/05/19, MD [facility] 11/03/2019 3:41 PM  To reach On-call, see care teams to locate the attending and reach out via [email or web address]. Between 7PM-7AM, please contact night-coverage If you still have difficulty reaching the attending provider, please page the [facility] (Director on Call) for [facility] on amion for assistance.

## 2019-11-03 NOTE — Progress Notes (Signed)
1 Day Post-Op   Subjective/Chief Complaint: Patient seen.  Did have some pain in the foot overnight.  Overall doing okay.   Objective: Vital signs in last 24 hours: Temp:  [96.8 F (36 C)-99.4 F (37.4 C)] 97.3 F (36.3 C) (10/24 0410) Pulse Rate:  [56-73] 56 (10/24 0410) Resp:  [13-20] 18 (10/24 0410) BP: (102-128)/(64-79) 105/67 (10/24 0410) SpO2:  [96 %-100 %] 96 % (10/24 0410) Last BM Date: 10/30/19  Intake/Output from previous day: 10/23 0701 - 10/24 0700 In: 1910 [P.O.:900; I.V.:800; IV Piggyback:210] Out: 2310 [Urine:2300; Blood:10] Intake/Output this shift: No intake/output data recorded.  The bandage on the left foot is dry and intact.  Upon removal there is only a mild amount of bleeding.  Incision appears well coapted but there does appear to be some possible early incisional necrosis along the distal aspect beneath the great toe area.  Still some erythematous and ecchymotic appearance in the medial and dorsal forefoot.      Lab Results:  Recent Labs    11/02/19 0640 11/03/19 0840  WBC 8.1 9.9  HGB 13.7 13.9  HCT 41.1 42.3  PLT 186 224   BMET Recent Labs    11/02/19 0640 11/03/19 0840  NA 135 136  K 4.1 4.4  CL 101 99  CO2 24 28  GLUCOSE 126* 162*  BUN 16 20  CREATININE 1.14 0.82  CALCIUM 8.9 9.1   PT/INR Recent Labs    11/01/19 1409  LABPROT 14.0  INR 1.1   ABG No results for input(s): PHART, HCO3 in the last 72 hours.  Invalid input(s): PCO2, PO2  Studies/Results: MR FOOT LEFT WO CONTRAST  Result Date: 11/01/2019 CLINICAL DATA:  Diabetic foot wound at the base of the left great toe and cellulitis EXAM: MRI OF THE LEFT FOOT WITHOUT CONTRAST TECHNIQUE: Multiplanar, multisequence MR imaging of the left forefoot was performed. No intravenous contrast was administered. COMPARISON:  Left foot x-ray 11/01/2019 FINDINGS: Bones/Joint/Cartilage Bone marrow edema within the medial hallux sesamoid with patchy low T1 marrow signal suggestive of  osteomyelitis (series 3 and 5, images 21-25). Marrow signal of the lateral hallux sesamoid and first metatarsal head are within normal limits. The remaining osseous structures are normal in signal. No acute fracture. No dislocation. No joint effusion. No significant arthropathy. Ligaments Intact Lisfranc ligament. Collateral ligaments of the forefoot are intact. Muscles and Tendons Diffuse edema-like T2 signal throughout the intrinsic musculature which may represent myositis and/or denervation changes. Intact flexor and extensor tendons. No tenosynovitis. Soft tissues Superficial plantar foot ulceration underlying the first metatarsal head with associated soft tissue thickening and soft tissue edema. Multiple foci of susceptibility within the plantar soft tissues underlying the proximal phalanx of the great toe. No organized fluid collection. Mild diffuse subcutaneous edema. There is nodular thickening of the plantar fascia in two locations, one of which is at the level of the mid first metatarsal diaphysis and additional fusiform thickening at the level of the first TMT joint (series 5, images 33 and 40). IMPRESSION: 1. Bone marrow signal changes within the medial hallux sesamoid suspicious for osteomyelitis. No additional sites of marrow signal abnormality within the great toe or remaining forefoot. 2. Plantar foot ulceration underlying the first metatarsal head with associated soft tissue thickening and soft tissue edema. Multiple foci of susceptibility within the plantar soft tissues underlying the proximal phalanx of the great toe which may reflect air tracking from the adjacent ulceration or possibly from a gas-forming infection. 3. Diffuse edema-like signal throughout the  intrinsic musculature of the forefoot which may represent myositis and/or denervation changes. 4. Findings of plantar fibromatosis. Electronically Signed   By: Duanne Guess D.O.   On: 11/01/2019 13:54   DG Foot Complete Left  Result  Date: 11/01/2019 CLINICAL DATA:  LEFT foot pain for 1 month, infected ulcer medially, diabetes mellitus, hypertension EXAM: LEFT FOOT - COMPLETE 3+ VIEW COMPARISON:  None FINDINGS: Osseous mineralization normal. Joint spaces preserved. Dressing artifacts overlie the medial aspect of the LEFT foot extending from the mid first metatarsal to the mid proximal phalanx great toe. No acute fracture, dislocation, or bone destruction. Small vessel vascular calcifications at ankle and distal lower leg. IMPRESSION: No acute osseous abnormalities. If there is persistent clinical concern for osteomyelitis or deep soft tissue infection consider MR. Electronically Signed   By: Ulyses Southward M.D.   On: 11/01/2019 12:31   DG MINI C-ARM IMAGE ONLY  Result Date: 11/02/2019 There is no interpretation for this exam.  This order is for images obtained during a surgical procedure.  Please See "Surgeries" Tab for more information regarding the procedure.    Anti-infectives: Anti-infectives (From admission, onward)   Start     Dose/Rate Route Frequency Ordered Stop   11/01/19 1400  cefTRIAXone (ROCEPHIN) 1 g in sodium chloride 0.9 % 100 mL IVPB        1 g 200 mL/hr over 30 Minutes Intravenous Every 24 hours 11/01/19 1330     11/01/19 1345  vancomycin (VANCOREADY) IVPB 2000 mg/400 mL  Status:  Discontinued        2,000 mg 200 mL/hr over 120 Minutes Intravenous  Once 11/01/19 1300 11/01/19 1327   11/01/19 1300  piperacillin-tazobactam (ZOSYN) IVPB 3.375 g  Status:  Discontinued        3.375 g 100 mL/hr over 30 Minutes Intravenous  Once 11/01/19 1256 11/01/19 1327      Assessment/Plan: s/p Procedure(s): IRRIGATION AND DEBRIDEMENT FOOT (Left) Assessment: Status post debridement left foot.   Plan: Plantar wound was repacked with iodoform gauze followed by a sterile gauze bandage.  Discussed with the patient that since he did not bleed much at all during his surgery and with the appearance of the wound would recommend  that we reconsult vascular to reassess his circulation.  Consult placed.  Will follow closely over the next couple of days.  LOS: 1 day    [name] 11/03/2019

## 2019-11-03 NOTE — H&P (View-Only) (Signed)
[facility] Vascular Consult Note  MRN : [id number]  Reginald Nielsen is a 59 y.o. (1960-02-06) male who presents with chief complaint of  Chief Complaint  Patient presents with  . Wound Infection  .  History of Present Illness: Patient known to service. Left foot ulcer and claudication. S/P Left AT/peroneal intervention in August. The patient initially noted improvement in the left leg pain and ulcer, however, in the last few weeks he has noted increased pain in the leg/foot and worsening of the left foot ulcer. Has been compliant with ASA/Plavix. Denies rest pain. Now s/p Left foot I&D per Dr. Alberteen Spindle yesterday. Intraoperatively, there was minimal bleeding from wound bed with concern of progressive ischemia.  Current Facility-Administered Medications  Medication Dose Route Frequency Provider Last Rate Last Admin  . acetaminophen (TYLENOL) tablet 650 mg  650 mg Oral Q6H PRN Lorretta Harp, MD      . aspirin EC tablet 81 mg  81 mg Oral Daily Lorretta Harp, MD   81 mg at 11/03/19 0948  . atorvastatin (LIPITOR) tablet 10 mg  10 mg Oral Daily Lorretta Harp, MD   10 mg at 11/03/19 0947  . brimonidine (ALPHAGAN) 0.2 % ophthalmic solution 1 drop  1 drop Both Eyes BID Lorretta Harp, MD   1 drop at 11/03/19 0948   And  . timolol (TIMOPTIC) 0.5 % ophthalmic solution 1 drop  1 drop Both Eyes BID Lorretta Harp, MD   1 drop at 11/03/19 0947  . cefTRIAXone (ROCEPHIN) 1 g in sodium chloride 0.9 % 100 mL IVPB  1 g Intravenous Q24H Lorretta Harp, MD 200 mL/hr at 11/02/19 1441 1 g at 11/02/19 1441  . gabapentin (NEURONTIN) capsule 100 mg  100 mg Oral TID Rolly Salter, MD      . hydrALAZINE (APRESOLINE) injection 5 mg  5 mg Intravenous Q2H PRN Lorretta Harp, MD      . influenza vac split quadrivalent PF (FLUARIX) injection 0.5 mL  0.5 mL Intramuscular Tomorrow-1000 Lorretta Harp, MD      . insulin aspart (novoLOG) injection 0-5 Units  0-5 Units Subcutaneous QHS Lorretta Harp, MD   2 Units at 11/02/19 2112  .  insulin aspart (novoLOG) injection 0-9 Units  0-9 Units Subcutaneous TID WC Lorretta Harp, MD   2 Units at 11/03/19 0946  . latanoprost (XALATAN) 0.005 % ophthalmic solution 1 drop  1 drop Both Eyes QHS Lorretta Harp, MD   1 drop at 11/02/19 2113  . morphine 2 MG/ML injection 2 mg  2 mg Intravenous Q4H PRN Lorretta Harp, MD   2 mg at 11/02/19 2340  . ondansetron (ZOFRAN) injection 4 mg  4 mg Intravenous Q8H PRN Lorretta Harp, MD      . oxyCODONE-acetaminophen (PERCOCET/ROXICET) 5-325 MG per tablet 1-2 tablet  1-2 tablet Oral Q4H PRN Linus Galas, DPM   2 tablet at 11/03/19 [phone]  . pneumococcal 23 valent vaccine (PNEUMOVAX-23) injection 0.5 mL  0.5 mL Intramuscular Tomorrow-1000 Lorretta Harp, MD        Past Medical History:  Diagnosis Date  . Diabetes mellitus without complication (HCC)    type 2  . Glaucoma   . Hyperlipidemia   . Hypertension   . Shoulder pain, left     Past Surgical History:  Procedure Laterality Date  . COLONOSCOPY WITH PROPOFOL N/A 04/08/2019   Procedure: COLONOSCOPY WITH PROPOFOL;  Surgeon: Midge Minium, MD;  Location: [facility];  Service: Endoscopy;  Laterality: N/A;  . frozen shoulder    .  LOWER EXTREMITY ANGIOGRAPHY Left 09/03/2019   Procedure: LOWER EXTREMITY ANGIOGRAPHY;  Surgeon: Renford DillsSchnier, Gregory G, MD;  Location: [facility];  Service: Cardiovascular;  Laterality: Left;  . SHOULDER SURGERY     2015 and 2016    Social History Social History   Tobacco Use  . Smoking status: Never Smoker  . Smokeless tobacco: Current User    Types: Chew  Vaping Use  . Vaping Use: Never used  Substance Use Topics  . Alcohol use: Not Currently    Comment: quit 11/2017  . Drug use: Not Currently    Family History Family History  Problem Relation Age of Onset  . Diabetes Father   . Cancer Father        lung  . COPD Father   . Heart attack Father   . Stroke Father   . Diabetes Paternal Aunt     No Known Allergies   REVIEW OF SYSTEMS (Negative unless  checked)  Constitutional: [] Weight loss  [] Fever  [] Chills Cardiac: [] Chest pain   [] Chest pressure   [] Palpitations   [] Shortness of breath when laying flat   [] Shortness of breath at rest   [] Shortness of breath with exertion. Vascular:  [] Pain in legs with walking   [] Pain in legs at rest   [] Pain in legs when laying flat   [x] Claudication   [x] Pain in feet when walking  [] Pain in feet at rest  [] Pain in feet when laying flat   [] History of DVT   [] Phlebitis   [] Swelling in legs   [] Varicose veins   [] Non-healing ulcers Pulmonary:   [] Uses home oxygen   [] Productive cough   [] Hemoptysis   [] Wheeze  [] COPD   [] Asthma Neurologic:  [] Dizziness  [] Blackouts   [] Seizures   [] History of stroke   [] History of TIA  [] Aphasia   [] Temporary blindness   [] Dysphagia   [] Weakness or numbness in arms   [] Weakness or numbness in legs Musculoskeletal:  [] Arthritis   [] Joint swelling   [] Joint pain   [] Low back pain Hematologic:  [] Easy bruising  [] Easy bleeding   [] Hypercoagulable state   [] Anemic  [] Hepatitis Gastrointestinal:  [] Blood in stool   [] Vomiting blood  [] Gastroesophageal reflux/heartburn   [] Difficulty swallowing. Genitourinary:  [] Chronic kidney disease   [] Difficult urination  [] Frequent urination  [] Burning with urination   [] Blood in urine Skin:  [] Rashes   [] Ulcers   [] Wounds Psychological:  [] History of anxiety   []  History of major depression.  Physical Examination  Vitals:   11/02/19 1925 11/02/19 2346 11/03/19 0410 11/03/19 1156  BP: 123/72 128/79 105/67 126/71  Pulse: 66 (!) 58 (!) 56 64  Resp: 18 20 18 16   Temp: (!) 97.5 F (36.4 C) (!) 97.5 F (36.4 C) (!) 97.3 F (36.3 C) 97.6 F (36.4 C)  TempSrc: Oral Oral Oral Oral  SpO2: 99% 100% 96% 100%  Weight:      Height:       Body mass index is 26.09 kg/m. Gen:  WD/WN, NAD Neck: Trachea midline.  No JVD.  Pulmonary:  Good air movement, respirations not labored, equal bilaterally.  Cardiac: RRR, normal S1, S2. Vascular:   Vessel Right Left  Radial Palpable Palpable  Ulnar Palpable Palpable  Brachial Palpable Palpable  Carotid Palpable, without bruit Palpable, without bruit  Aorta Not palpable N/A  Femoral Palpable Palpable  Popliteal warm    Warm to toes Surgical dressing/warm to ankle  Gastrointestinal: soft, non-tender/non-distended. No guarding/reflex.  Musculoskeletal: M/S 5/5 throughout.  Extremities  without ischemic changes.  No deformity or atrophy. No edema. Neurologic: Sensation grossly intact in extremities.  Symmetrical.  Speech is fluent. Motor exam as listed above. Psychiatric: Judgment intact, Mood & affect appropriate for pt's clinical situation.       CBC Lab Results  Component Value Date   WBC 9.9 11/03/2019   HGB 13.9 11/03/2019   HCT 42.3 11/03/2019   MCV 84.1 11/03/2019   PLT 224 11/03/2019    BMET    Component Value Date/Time   NA 136 11/03/2019 0840   NA 140 02/13/2019 0953   K 4.4 11/03/2019 0840   CL 99 11/03/2019 0840   CO2 28 11/03/2019 0840   GLUCOSE 162 (H) 11/03/2019 0840   BUN 20 11/03/2019 0840   BUN 15 02/13/2019 0953   CREATININE 0.82 11/03/2019 0840   CALCIUM 9.1 11/03/2019 0840   GFRNONAA >60 11/03/2019 0840   GFRAA >60 09/03/2019 0925   Estimated Creatinine Clearance: 123.8 mL/min (by C-G formula based on SCr of 0.82 mg/dL).  COAG Lab Results  Component Value Date   INR 1.1 11/01/2019    Radiology MR FOOT LEFT WO CONTRAST  Result Date: 11/01/2019 CLINICAL DATA:  Diabetic foot wound at the base of the left great toe and cellulitis EXAM: MRI OF THE LEFT FOOT WITHOUT CONTRAST TECHNIQUE: Multiplanar, multisequence MR imaging of the left forefoot was performed. No intravenous contrast was administered. COMPARISON:  Left foot x-ray 11/01/2019 FINDINGS: Bones/Joint/Cartilage Bone marrow edema within the medial hallux sesamoid with patchy low T1 marrow signal suggestive of osteomyelitis (series 3 and 5, images 21-25). Marrow signal of the lateral  hallux sesamoid and first metatarsal head are within normal limits. The remaining osseous structures are normal in signal. No acute fracture. No dislocation. No joint effusion. No significant arthropathy. Ligaments Intact Lisfranc ligament. Collateral ligaments of the forefoot are intact. Muscles and Tendons Diffuse edema-like T2 signal throughout the intrinsic musculature which may represent myositis and/or denervation changes. Intact flexor and extensor tendons. No tenosynovitis. Soft tissues Superficial plantar foot ulceration underlying the first metatarsal head with associated soft tissue thickening and soft tissue edema. Multiple foci of susceptibility within the plantar soft tissues underlying the proximal phalanx of the great toe. No organized fluid collection. Mild diffuse subcutaneous edema. There is nodular thickening of the plantar fascia in two locations, one of which is at the level of the mid first metatarsal diaphysis and additional fusiform thickening at the level of the first TMT joint (series 5, images 33 and 40). IMPRESSION: 1. Bone marrow signal changes within the medial hallux sesamoid suspicious for osteomyelitis. No additional sites of marrow signal abnormality within the great toe or remaining forefoot. 2. Plantar foot ulceration underlying the first metatarsal head with associated soft tissue thickening and soft tissue edema. Multiple foci of susceptibility within the plantar soft tissues underlying the proximal phalanx of the great toe which may reflect air tracking from the adjacent ulceration or possibly from a gas-forming infection. 3. Diffuse edema-like signal throughout the intrinsic musculature of the forefoot which may represent myositis and/or denervation changes. 4. Findings of plantar fibromatosis. Electronically Signed   By: Duanne Guess D.O.   On: 11/01/2019 13:54   DG Foot Complete Left  Result Date: 11/01/2019 CLINICAL DATA:  LEFT foot pain for 1 month, infected  ulcer medially, diabetes mellitus, hypertension EXAM: LEFT FOOT - COMPLETE 3+ VIEW COMPARISON:  None FINDINGS: Osseous mineralization normal. Joint spaces preserved. Dressing artifacts overlie the medial aspect of the LEFT foot extending from  the mid first metatarsal to the mid proximal phalanx great toe. No acute fracture, dislocation, or bone destruction. Small vessel vascular calcifications at ankle and distal lower leg. IMPRESSION: No acute osseous abnormalities. If there is persistent clinical concern for osteomyelitis or deep soft tissue infection consider MR. Electronically Signed   By: Ulyses Southward M.D.   On: 11/01/2019 12:31   DG MINI C-ARM IMAGE ONLY  Result Date: 11/02/2019 There is no interpretation for this exam.  This order is for images obtained during a surgical procedure.  Please See "Surgeries" Tab for more information regarding the procedure.   VAS [place] ABI WITH/WO TBI  Result Date: 10/10/2019 LOWER EXTREMITY DOPPLER STUDY Indications: Ulceration.  Comparison Study: 08/16/2019 Performing Technologist: Salvadore Farber RVT  Examination Guidelines: A complete evaluation includes at minimum, Doppler waveform signals and systolic blood pressure reading at the level of bilateral brachial, anterior tibial, and posterior tibial arteries, when vessel segments are accessible. Bilateral testing is considered an integral part of a complete examination. Photoelectric Plethysmograph (PPG) waveforms and toe systolic pressure readings are included as required and additional duplex testing as needed. Limited examinations for reoccurring indications may be performed as noted.  ABI Findings: +---------+------------------+-----+--------+--------+ Right    Rt Pressure (mmHg)IndexWaveformComment  +---------+------------------+-----+--------+--------+ Brachial 132                                     +---------+------------------+-----+--------+--------+ ATA                                     NonComp   +---------+------------------+-----+--------+--------+ PTA      102               0.77 biphasic         +---------+------------------+-----+--------+--------+ Great Toe72                0.55 Abnormal         +---------+------------------+-----+--------+--------+ +---------+------------------+-----+--------+-------+ Left     Lt Pressure (mmHg)IndexWaveformComment +---------+------------------+-----+--------+-------+ Brachial 132                                    +---------+------------------+-----+--------+-------+ ATA      178               1.35 biphasic        +---------+------------------+-----+--------+-------+ PTA      77                0.58 biphasic        +---------+------------------+-----+--------+-------+ Great Toe78                0.59 Normal          +---------+------------------+-----+--------+-------+ +-------+-----------+-----------+------------+------------+ ABI/TBIToday's ABIToday's TBIPrevious ABIPrevious TBI +-------+-----------+-----------+------------+------------+ Right  .77        .55        .60         .82          +-------+-----------+-----------+------------+------------+ Left   1.35       .59        .82         .49          +-------+-----------+-----------+------------+------------+ Left ABIs and TBIs appear increased compared to prior study on 08/16/2019.  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 The right toe-brachial index is abnormal. Left: Resting left ankle-brachial index is within normal range. No evidence of significant left lower extremity arterial disease. The left toe-brachial index is abnormal.  *See table(s) above for measurements and observations.  Electronically signed by Levora Dredge MD on 10/10/2019 at 10:56:29 AM.   Final       Assessment/Plan 1. Nonhealing LEFT foot Ulcer/Abscess, progressive claudication Left leg 2. Will plan for  angiogram to assess perfusion/possible intervention of the left lower extremity/foot on Tuesday per Dr. Gilda Crease.    Bertram Denver, MD  11/03/2019 12:46 PM    This note was created with Dragon medical transcription system.  Any error is purely unintentional

## 2019-11-03 NOTE — Plan of Care (Signed)
Continuing with plan of care. 

## 2019-11-04 ENCOUNTER — Encounter: Payer: Self-pay | Admitting: Podiatry

## 2019-11-04 ENCOUNTER — Other Ambulatory Visit (INDEPENDENT_AMBULATORY_CARE_PROVIDER_SITE_OTHER): Payer: Self-pay | Admitting: Vascular Surgery

## 2019-11-04 DIAGNOSIS — E1151 Type 2 diabetes mellitus with diabetic peripheral angiopathy without gangrene: Secondary | ICD-10-CM | POA: Diagnosis not present

## 2019-11-04 DIAGNOSIS — L089 Local infection of the skin and subcutaneous tissue, unspecified: Secondary | ICD-10-CM

## 2019-11-04 DIAGNOSIS — M86172 Other acute osteomyelitis, left ankle and foot: Secondary | ICD-10-CM

## 2019-11-04 DIAGNOSIS — I739 Peripheral vascular disease, unspecified: Secondary | ICD-10-CM | POA: Diagnosis not present

## 2019-11-04 LAB — GLUCOSE, CAPILLARY
Glucose-Capillary: 125 mg/dL — ABNORMAL HIGH (ref 70–99)
Glucose-Capillary: 121 mg/dL — ABNORMAL HIGH (ref 70–99)
Glucose-Capillary: 110 mg/dL — ABNORMAL HIGH (ref 70–99)
Glucose-Capillary: 128 mg/dL — ABNORMAL HIGH (ref 70–99)
Glucose-Capillary: 150 mg/dL — ABNORMAL HIGH (ref 70–99)

## 2019-11-04 LAB — CBC
HCT: 42.3 % (ref 39.0–52.0)
Hemoglobin: 13.9 g/dL (ref 13.0–17.0)
MCH: 27.9 pg (ref 26.0–34.0)
MCHC: 32.9 g/dL (ref 30.0–36.0)
MCV: 84.8 fL (ref 80.0–100.0)
Platelets: 228 10*3/uL (ref 150–400)
RBC: 4.99 MIL/uL (ref 4.22–5.81)
RDW: 12.7 % (ref 11.5–15.5)
WBC: 7.3 10*3/uL (ref 4.0–10.5)
nRBC: 0 % (ref 0.0–0.2)

## 2019-11-04 LAB — ANAEROBIC CULTURE

## 2019-11-04 LAB — BASIC METABOLIC PANEL
Anion gap: 9 (ref 5–15)
BUN: 19 mg/dL (ref 6–20)
CO2: 27 mmol/L (ref 22–32)
Calcium: 9.1 mg/dL (ref 8.9–10.3)
Chloride: 104 mmol/L (ref 98–111)
Creatinine, Ser: 0.86 mg/dL (ref 0.61–1.24)
GFR, Estimated: >60 mL/min (ref >60)
Glucose, Bld: 144 mg/dL — ABNORMAL HIGH (ref 70–99)
Potassium: 4.3 mmol/L (ref 3.5–5.1)
Sodium: 140 mmol/L (ref 135–145)

## 2019-11-04 MED ORDER — SODIUM CHLORIDE 0.9 % IV SOLN
3.0000 g | Freq: Four times a day (QID) | INTRAVENOUS | Status: DC
  Administered 2019-11-04 – 2019-11-07 (×12): 3 g via INTRAVENOUS
  Filled 2019-11-04 (×5): qty 8
  Filled 2019-11-04: qty 3
  Filled 2019-11-04 (×6): qty 8
  Filled 2019-11-04: qty 3
  Filled 2019-11-04 (×3): qty 8

## 2019-11-04 MED ORDER — VANCOMYCIN HCL IN DEXTROSE 1-5 GM/200ML-% IV SOLN
1000.0000 mg | Freq: Three times a day (TID) | INTRAVENOUS | Status: DC
  Administered 2019-11-05 (×2): 1000 mg via INTRAVENOUS
  Filled 2019-11-04 (×3): qty 200

## 2019-11-04 MED ORDER — SODIUM CHLORIDE 0.9 % IV SOLN: INTRAVENOUS | Status: DC

## 2019-11-04 MED ORDER — LACTULOSE 10 GM/15ML PO SOLN
20.0000 g | Freq: Every day | ORAL | Status: DC
  Administered 2019-11-04 – 2019-11-07 (×2): 20 g via ORAL
  Filled 2019-11-04 (×4): qty 30

## 2019-11-04 MED ORDER — VANCOMYCIN HCL 2000 MG/400ML IV SOLN
2000.0000 mg | Freq: Once | INTRAVENOUS | Status: AC
  Administered 2019-11-04: 2000 mg via INTRAVENOUS
  Filled 2019-11-04: qty 400

## 2019-11-04 NOTE — Progress Notes (Signed)
[facility] Progress Note  Patient: Reginald Nielsen    XVQ:[id number]  DOA: 11/01/2019     Date of Service: the patient was seen and examined on 11/04/2019  Brief hospital course: Reginald Nielsen a 59 y.o.malewith medical history significant ofhypertension, hyperlipidemia, diabetes mellitus, PVD, glaucoma, who presents with left foot infection.  Currently plan is postop recovery and modification of IV antibiotics.  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  Patient failed outpatient treatment includingantibiotic treatment.  No fever or leukocytosis. Clinically not septic.  Dr. Alberteen Spindle ofpodiatry is consulted. Underwent incision and drainage with debridement. We will follow up on cultures.  Currently growing staph aureus as well as an anaerobe. ID consulted.  Appreciate assistance Initially on ceftriaxone.  Now on Unasyn.  Monitor results of the sensitivities.  DM (diabetes mellitus), type 2 with peripheral vascular complications [facility]): Reported neuropathy pain bilaterally. Recent A1c 5.3, well controlled. Patient is taking Metformin and glipizide -SSI Add gabapentin 300 mg.  100 mg 3 times daily.  History ofPAD (peripheral artery disease) [facility]): Recent angiography August 2021 PTCA of left anterior tibial and peroneal arteries. Poor circulation seen at the time of the debridement Vascular surgery consulted. Scheduled for repeat angiography on Monday.  HLD (hyperlipidemia) -Lipitor  Hypertension: Pt is not taking medications currently.  -IV hydralazine as needed  Hyperlipidemia. Continue Lipitor.  Diet: Cardiac diet DVT Prophylaxis:   SCDs Start: 11/01/19 1329   Advance goals of care discussion: Full code  Family Communication: family was present at bedside, at the time of interview.  All questions answered  satisfactorily.  Disposition:  Status is: Inpatient  Remains inpatient appropriate because:Ongoing diagnostic testing needed not appropriate for outpatient work up   Dispo: The patient is from: Home              Anticipated d/c is to: Home              Anticipated d/c date is: > 3 days              Patient currently is not medically stable to d/c.  Subjective: Pain still present but improving.  No nausea no vomiting.  No fever no chills.  Physical Exam:  General: Appear in mild distress, no Rash; Oral Mucosa Clear, moist. no Abnormal Neck Mass Or lumps, Conjunctiva normal  Cardiovascular: S1 and S2 Present, no Murmur, Respiratory: good respiratory effort, Bilateral Air entry present and CTA, no Crackles, no wheezes Abdomen: Bowel Sound present, Soft and no tenderness Extremities: no Pedal edema Neurology: alert and oriented to time, place, and person affect appropriate. no new focal deficit Gait not checked due to patient safety concerns  Vitals:   11/03/19 2301 11/04/19 0459 11/04/19 1129 11/04/19 1617  BP: 121/79 (!) 95/58 110/68 117/71  Pulse: (!) 59 62 (!) 54 (!) 56  Resp: 13 16 16 16   Temp: 98.1 F (36.7 C) 97.8 F (36.6 C) 97.6 F (36.4 C) 97.7 F (36.5 C)  TempSrc: Oral Oral Oral Oral  SpO2: 100% 98% 100% 100%  Weight:      Height:        Intake/Output Summary (Last 24 hours) at 11/04/2019 1905 Last data filed at 11/04/2019 1300 Gross per 24 hour  Intake 0 ml  Output --  Net 0 ml   Filed Weights   11/01/19 1139  Weight: 99.8 kg    Data Reviewed: I have personally reviewed and interpreted  daily labs, tele strips, imagings as discussed above. I reviewed all nursing notes, pharmacy notes, vitals, pertinent old records I have discussed plan of care as described above with RN and patient/family.  CBC: Recent Labs  Lab 11/01/19 1142 11/02/19 0640 11/03/19 0840 11/04/19 0425  WBC 10.4 8.1 9.9 7.3  NEUTROABS 7.7  --   --   --   HGB 14.7 13.7 13.9  13.9  HCT 44.7 41.1 42.3 42.3  MCV 84.8 84.4 84.1 84.8  PLT 206 186 224 228   Basic Metabolic Panel: Recent Labs  Lab 11/01/19 1142 11/02/19 0640 11/03/19 0840 11/04/19 0425  NA 135 135 136 140  K 4.2 4.1 4.4 4.3  CL 99 101 99 104  CO2 25 24 28 27   GLUCOSE 140* 126* 162* 144*  BUN 23* 16 20 19   CREATININE 0.92 1.14 0.82 0.86  CALCIUM 9.4 8.9 9.1 9.1    Studies: No results found.  Scheduled Meds: . aspirin EC  81 mg Oral Daily  . atorvastatin  10 mg Oral Daily  . brimonidine  1 drop Both Eyes BID   And  . timolol  1 drop Both Eyes BID  . gabapentin  100 mg Oral TID  . influenza vac split quadrivalent PF  0.5 mL Intramuscular Tomorrow-1000  . insulin aspart  0-5 Units Subcutaneous QHS  . insulin aspart  0-9 Units Subcutaneous TID WC  . lactulose  20 g Oral Daily  . latanoprost  1 drop Both Eyes QHS  . pneumococcal 23 valent vaccine  0.5 mL Intramuscular Tomorrow-1000   Continuous Infusions: . [START ON 11/05/2019] sodium chloride    . ampicillin-sulbactam (UNASYN) IV    . vancomycin     PRN Meds: acetaminophen, hydrALAZINE, morphine injection, ondansetron (ZOFRAN) IV, oxyCODONE-acetaminophen  Time spent: 35 minutes  Author: , MD [facility] 11/04/2019 7:05 PM  To reach On-call, see care teams to locate the attending and reach out via [email or web address]. Between 7PM-7AM, please contact night-coverage If you still have difficulty reaching the attending provider, please page the [facility] (Director on Call) for [facility] on amion for assistance.

## 2019-11-04 NOTE — Progress Notes (Signed)
Sepsis screen performed and is negative. Will continue to monitor. 

## 2019-11-04 NOTE — Progress Notes (Signed)
2 Days Post-Op   Subjective/Chief Complaint: Patient seen.  Relates the pain in his left foot seems to be a little bit better today.  Relates the pain seems to be more up underneath the first and second toe area.   Objective: Vital signs in last 24 hours: Temp:  [97.6 F (36.4 C)-98.4 F (36.9 C)] 97.6 F (36.4 C) (10/25 1129) Pulse Rate:  [52-62] 54 (10/25 1129) Resp:  [13-18] 16 (10/25 1129) BP: (95-129)/(58-79) 110/68 (10/25 1129) SpO2:  [98 %-100 %] 100 % (10/25 1129) Last BM Date: 10/30/19  Intake/Output from previous day: 10/24 0701 - 10/25 0700 In: 220 [P.O.:120; IV Piggyback:100] Out: 450 [Urine:450] Intake/Output this shift: No intake/output data recorded.  Bandage on the left foot is dry and intact with no strikethrough.  Lab Results:  Recent Labs    11/03/19 0840 11/04/19 0425  WBC 9.9 7.3  HGB 13.9 13.9  HCT 42.3 42.3  PLT 224 228   BMET Recent Labs    11/03/19 0840 11/04/19 0425  NA 136 140  K 4.4 4.3  CL 99 104  CO2 28 27  GLUCOSE 162* 144*  BUN 20 19  CREATININE 0.82 0.86  CALCIUM 9.1 9.1   PT/INR Recent Labs    11/01/19 1409  LABPROT 14.0  INR 1.1   ABG No results for input(s): PHART, HCO3 in the last 72 hours.  Invalid input(s): PCO2, PO2  Studies/Results: No results found.  Anti-infectives: Anti-infectives (From admission, onward)   Start     Dose/Rate Route Frequency Ordered Stop   11/01/19 1400  cefTRIAXone (ROCEPHIN) 1 g in sodium chloride 0.9 % 100 mL IVPB        1 g 200 mL/hr over 30 Minutes Intravenous Every 24 hours 11/01/19 1330     11/01/19 1345  vancomycin (VANCOREADY) IVPB 2000 mg/400 mL  Status:  Discontinued        2,000 mg 200 mL/hr over 120 Minutes Intravenous  Once 11/01/19 1300 11/01/19 1327   11/01/19 1300  piperacillin-tazobactam (ZOSYN) IVPB 3.375 g  Status:  Discontinued        3.375 g 100 mL/hr over 30 Minutes Intravenous  Once 11/01/19 1256 11/01/19 1327      Assessment/Plan: s/p  Procedure(s): IRRIGATION AND DEBRIDEMENT FOOT (Left) Assessment: Stable status post I&D with excision sesamoid.   Plan: Dressing left intact.  Plan for dressing change tomorrow.  Patient is also scheduled for angiogram tomorrow to reassess his circulation.  Continue on antibiotics.  LOS: 2 days    [name] 11/04/2019

## 2019-11-04 NOTE — Consult Note (Signed)
Pharmacy Antibiotic Note  Reginald Nielsen is a 59 y.o. male with medical history including PVD, diabetes admitted on 11/01/2019 with abscess of left foot with osteomyelitis. Patient empirically started on ceftriaxone. Would cultures now growing Staphylococcus aureus, anaerobic flora. Pharmacy has been consulted for vancomycin and ampicillin/sulbactam dosing.  Plan:  Ampicillin/sulbactam 3 g IV q6h  Vancomycin 2 g IV LD x 1 followed by maintenance regimen of vancomycin 1 g IV q8h per nomogram --Goal trough 15 - 20 mcg/mL --Daily Scr per protocol --Levels at steady state as indicated  Height: 6\' 5"  (195.6 cm) Weight: 99.8 kg (220 lb) IBW/kg (Calculated) : 89.1  Temp (24hrs), Avg:97.9 F (36.6 C), Min:97.6 F (36.4 C), Max:98.4 F (36.9 C)  Recent Labs  Lab 11/01/19 1142 11/02/19 0640 11/03/19 0840 11/04/19 0425  WBC 10.4 8.1 9.9 7.3  CREATININE 0.92 1.14 0.82 0.86  LATICACIDVEN 1.1  --   --   --     Estimated Creatinine Clearance: 118 mL/min (by C-G formula based on SCr of 0.86 mg/dL).    No Known Allergies  Antimicrobials this admission: Ceftriaxone 10/22 >> 10/25 Ampicillin/sulbactam 10/25 >>  Vancomycin 10/25 >>   Dose adjustments this admission: N/A  Microbiology results: 10/22 BCx: NGTD 10/23 Wound culture: Staphylococcus aureus, holding for anaerobe 10/23 Tissue culture: GPC, GNR, mixed anaerobic flora  Thank you for allowing pharmacy to be a part of this patient's care.  11/23 11/04/2019 7:04 PM

## 2019-11-04 NOTE — Consult Note (Signed)
NAME: Reginald Nielsen  DOB: 12-14-1960  MRN: [id number]  Date/Time: 11/04/2019 6:29 PM  REQUESTING PROVIDER: Dr. Allena Katz Subjective:  REASON FOR CONSULT: left foot infection  ? Reginald Nielsen is a 59 y.o. male with a history of DM, HTN presented with left foot wound . Pt has had a wound for nearly 2 months and was followed by podiatrist. He had undergone angio for PAD on 09/03/19 and had PTA with crosser atherectomy for left anterior tibial, PTA of left peroneal  By Dr.Schnier. He was followed by podiatrist Dr.Cline and was doing okay until a few days when it got worse- he went to his PCP for a routine visit on 11/01/19 and she sent him to the ED for admission and Iv antibiotics Vital signs in the ED VITAL SIGNS:    Vitals:   11/01/19 1138  BP: 122/69  Pulse: 85  Resp: 20  Temp: 98.6 F (37 C)  SpO2: 100%   11/01/19 MRI of the foot showed Bone marrow signal changes within the medial hallux sesamoid suspicious for osteomyelitis. Plantar foot ulceration underlying the first metatarsal head with associated soft tissue thickening and soft tissue edema. Multiple foci of susceptibility within the plantar soft tissues underlying the proximal phalanx of the great toe which may reflect air tracking from the adjacent ulceration or possibly from a gas-forming Infection. Dr.Cline took him for surgery on 11/02/19  Underwent : Incision and drainage left foot with debridement tibial sesamoid. Cultures sent gowing staph aureus Pathology pending. I am seeing the patient for management of the infection Past Medical History:  Diagnosis Date  . Diabetes mellitus without complication (HCC)    type 2  . Glaucoma   . Hyperlipidemia   . Hypertension   . Shoulder pain, left     Past Surgical History:  Procedure Laterality Date  . COLONOSCOPY WITH PROPOFOL N/A 04/08/2019   Procedure: COLONOSCOPY WITH PROPOFOL;  Surgeon: Midge Minium, MD;  Location: [facility];  Service: Endoscopy;   Laterality: N/A;  . frozen shoulder    . IRRIGATION AND DEBRIDEMENT FOOT Left 11/02/2019   Procedure: IRRIGATION AND DEBRIDEMENT FOOT;  Surgeon: Linus Galas, DPM;  Location: [facility];  Service: Podiatry;  Laterality: Left;  . LOWER EXTREMITY ANGIOGRAPHY Left 09/03/2019   Procedure: LOWER EXTREMITY ANGIOGRAPHY;  Surgeon: Renford Dills, MD;  Location: [facility];  Service: Cardiovascular;  Laterality: Left;  . SHOULDER SURGERY     2015 and 2016    Social History   Socioeconomic History  . Marital status: Married    Spouse name: Not on file  . Number of children: 1  . Years of education: Not on file  . Highest education level: High school graduate  Occupational History  . Occupation: retired    Comment: truck Hospital doctor  Tobacco Use  . Smoking status: Never Smoker  . Smokeless tobacco: Current User    Types: Chew  Vaping Use  . Vaping Use: Never used  Substance and Sexual Activity  . Alcohol use: Not Currently    Comment: quit 11/2017  . Drug use: Not Currently  . Sexual activity: Not Currently  Other Topics Concern  . Not on file  Social History Narrative  . Not on file   Social Determinants of Health   Financial Resource Strain: Low Risk   . Difficulty of Paying Living Expenses: Not very hard  Food Insecurity: No Food Insecurity  . Worried About Programme researcher, broadcasting/film/video in the Last Year: Never true  .  Ran Out of Food in the Last Year: Never true  Transportation Needs: No Transportation Needs  . Lack of Transportation (Medical): No  . Lack of Transportation (Non-Medical): No  Physical Activity: Insufficiently Active  . Days of Exercise per Week: 4 days  . Minutes of Exercise per Session: 30 min  Stress: No Stress Concern Present  . Feeling of Stress : Only a little  Social Connections: Unknown  . Frequency of Communication with Friends and Family: Patient refused  . Frequency of Social Gatherings with Friends and Family: Patient refused  . Attends Religious  Services: Patient refused  . Active Member of Clubs or Organizations: Patient refused  . Attends Banker Meetings: Patient refused  . Marital Status: Married  Catering manager Violence: Not At Risk  . Fear of Current or Ex-Partner: No  . Emotionally Abused: No  . Physically Abused: No  . Sexually Abused: No    Family History  Problem Relation Age of Onset  . Diabetes Father   . Cancer Father        lung  . COPD Father   . Heart attack Father   . Stroke Father   . Diabetes Paternal Aunt    No Known Allergies  ? Current Facility-Administered Medications  Medication Dose Route Frequency Provider Last Rate Last Admin  . [START ON 11/05/2019] 0.9 %  sodium chloride infusion   Intravenous Continuous Stegmayer, Kimberly A, PA-C      . acetaminophen (TYLENOL) tablet 650 mg  650 mg Oral Q6H PRN Lorretta Harp, MD      . aspirin EC tablet 81 mg  81 mg Oral Daily Lorretta Harp, MD   81 mg at 11/04/19 0102  . atorvastatin (LIPITOR) tablet 10 mg  10 mg Oral Daily Lorretta Harp, MD   10 mg at 11/04/19 7253  . brimonidine (ALPHAGAN) 0.2 % ophthalmic solution 1 drop  1 drop Both Eyes BID Lorretta Harp, MD   1 drop at 11/04/19 6644   And  . timolol (TIMOPTIC) 0.5 % ophthalmic solution 1 drop  1 drop Both Eyes BID Lorretta Harp, MD   1 drop at 11/04/19 [phone]  . cefTRIAXone (ROCEPHIN) 1 g in sodium chloride 0.9 % 100 mL IVPB  1 g Intravenous Q24H Lorretta Harp, MD 200 mL/hr at 11/04/19 1557 1 g at 11/04/19 1557  . gabapentin (NEURONTIN) capsule 100 mg  100 mg Oral TID Rolly Salter, MD   100 mg at 11/04/19 1553  . hydrALAZINE (APRESOLINE) injection 5 mg  5 mg Intravenous Q2H PRN Lorretta Harp, MD      . influenza vac split quadrivalent PF (FLUARIX) injection 0.5 mL  0.5 mL Intramuscular Tomorrow-1000 Lorretta Harp, MD      . insulin aspart (novoLOG) injection 0-5 Units  0-5 Units Subcutaneous QHS Lorretta Harp, MD   2 Units at 11/02/19 2112  . insulin aspart (novoLOG) injection 0-9 Units  0-9 Units Subcutaneous  TID WC Lorretta Harp, MD   1 Units at 11/04/19 1753  . lactulose (CHRONULAC) 10 GM/15ML solution 20 g  20 g Oral Daily Rolly Salter, MD   20 g at 11/04/19 1754  . latanoprost (XALATAN) 0.005 % ophthalmic solution 1 drop  1 drop Both Eyes QHS Lorretta Harp, MD   1 drop at 11/03/19 2102  . morphine 2 MG/ML injection 2 mg  2 mg Intravenous Q4H PRN Lorretta Harp, MD   2 mg at 11/02/19 2340  . ondansetron (ZOFRAN) injection 4 mg  4 mg  Intravenous Q8H PRN Lorretta Harp, MD      . oxyCODONE-acetaminophen (PERCOCET/ROXICET) 5-325 MG per tablet 1-2 tablet  1-2 tablet Oral Q4H PRN Linus Galas, DPM   2 tablet at 11/04/19 1753  . pneumococcal 23 valent vaccine (PNEUMOVAX-23) injection 0.5 mL  0.5 mL Intramuscular Tomorrow-1000 Lorretta Harp, MD         Abtx:  Anti-infectives (From admission, onward)   Start     Dose/Rate Route Frequency Ordered Stop   11/01/19 1400  cefTRIAXone (ROCEPHIN) 1 g in sodium chloride 0.9 % 100 mL IVPB        1 g 200 mL/hr over 30 Minutes Intravenous Every 24 hours 11/01/19 1330     11/01/19 1345  vancomycin (VANCOREADY) IVPB 2000 mg/400 mL  Status:  Discontinued        2,000 mg 200 mL/hr over 120 Minutes Intravenous  Once 11/01/19 1300 11/01/19 1327   11/01/19 1300  piperacillin-tazobactam (ZOSYN) IVPB 3.375 g  Status:  Discontinued        3.375 g 100 mL/hr over 30 Minutes Intravenous  Once 11/01/19 1256 11/01/19 1327      REVIEW OF SYSTEMS:  Const: negative fever, negative chills, negative weight loss Eyes: negative diplopia or visual changes, negative eye pain ENT: negative coryza, negative sore throat Resp: negative cough, hemoptysis, dyspnea Cards: negative for chest pain, palpitations, lower extremity edema GU: negative for frequency, dysuria and hematuria GI: Negative for abdominal pain, diarrhea, bleeding, constipation Skin: negative for rash and pruritus Heme: negative for easy bruising and gum/nose bleeding MS: as above Neurolo:negative for headaches, dizziness,  vertigo, memory problems  Psych: negative for feelings of anxiety, depression  Endocrine: negative for thyroid, diabetes Allergy/Immunology- negative for any medication or food allergies  Objective:  VITALS:  BP 117/71 (BP Location: Left Arm)   Pulse (!) 56   Temp 97.7 F (36.5 C) (Oral)   Resp 16   Ht  (1.956 m)   Wt 99.8 kg   SpO2 100%   BMI 26.09 kg/m  PHYSICAL EXAM:  General: Alert, cooperative, no distress, appears stated age.  Head: Normocephalic, without obvious abnormality, atraumatic. Eyes: Conjunctivae clear, anicteric sclerae. Pupils are equal ENT Nares normal. No drainage or sinus tenderness. Lips, mucosa, and tongue normal. No Thrush Malar flush Neck: Supple, symmetrical, no adenopathy, thyroid: non tender no carotid bruit and no JVD. Back: No CVA tenderness. Lungs: Clear to auscultation bilaterally. No Wheezing or Rhonchi. No rales. Heart: Regular rate and rhythm, no murmur, rub or gallop. Abdomen: Soft, non-tender,not distended. Bowel sounds normal. No masses Extremities: left surgical dressing not removed'checked pictures        Skin: No rashes or lesions. Or bruising Lymph: Cervical, supraclavicular normal. Neurologic: Grossly non-focal Pertinent Labs Lab Results CBC    Component Value Date/Time   WBC 7.3 11/04/2019 0425   RBC 4.99 11/04/2019 0425   HGB 13.9 11/04/2019 0425   HCT 42.3 11/04/2019 0425   PLT 228 11/04/2019 0425   MCV 84.8 11/04/2019 0425   MCH 27.9 11/04/2019 0425   MCHC 32.9 11/04/2019 0425   RDW 12.7 11/04/2019 0425   LYMPHSABS 1.7 11/01/2019 1142   MONOABS 0.9 11/01/2019 1142   EOSABS 0.1 11/01/2019 1142   BASOSABS 0.1 11/01/2019 1142    CMP Latest Ref Rng & Units 11/04/2019 11/03/2019 11/02/2019  Glucose 70 - 99 mg/dL 295(A) 213(Y) 865(H)  BUN 6 - 20 mg/dL Creatinine 0.61 - 1.24 mg/dL 8.46 9.62 9.52  Sodium 135 - 145  mmol/L 140 136 135  Potassium 3.5 - 5.1 mmol/L 4.3 4.4 4.1  Chloride 98 - 111 mmol/L  104 99 101  CO2 22 - 32 mmol/L Calcium 8.9 - 10.3 mg/dL 9.1 9.1 8.9  Total Protein 6.5 - 8.1 g/dL - - -  Total Bilirubin 0.3 - 1.2 mg/dL - - -  Alkaline Phos 38 - 126 U/L - - -  AST 15 - 41 U/L - - -  ALT 0 - 44 U/L - - -      Microbiology: Recent Results (from the past 240 hour(s))  Respiratory Panel by RT PCR (Flu A&B, Covid) - Nasopharyngeal Swab     Status: None   Collection Time: 11/01/19  2:09 PM   Specimen: Nasopharyngeal Swab  Result Value Ref Range Status   SARS Coronavirus 2 by RT PCR NEGATIVE NEGATIVE Final    Comment: (NOTE) SARS-CoV-2 target nucleic acids are NOT DETECTED.  The SARS-CoV-2 RNA is generally detectable in upper respiratoy specimens during the acute phase of infection. The lowest concentration of SARS-CoV-2 viral copies this assay can detect is 131 copies/mL. A negative result does not preclude SARS-Cov-2 infection and should not be used as the sole basis for treatment or other patient management decisions. A negative result may occur with  improper specimen collection/handling, submission of specimen other than nasopharyngeal swab, presence of viral mutation(s) within the areas targeted by this assay, and inadequate number of viral copies (<131 copies/mL). A negative result must be combined with clinical observations, patient history, and epidemiological information. The expected result is Negative.  Fact Sheet for Patients:  https://www.moore.com/  Fact Sheet for Healthcare Providers:  https://www.young.biz/  This test is no t yet approved or cleared by the [place] FDA and  has been authorized for detection and/or diagnosis of SARS-CoV-2 by FDA under an Emergency Use Authorization (EUA). This EUA will remain  in effect (meaning this test can be used) for the duration of the COVID-19 declaration under Section 564(b)(1) of the Act, 21 U.S.C. section 360bbb-3(b)(1), unless the authorization is  terminated or revoked sooner.     Influenza A by PCR NEGATIVE NEGATIVE Final   Influenza B by PCR NEGATIVE NEGATIVE Final    Comment: (NOTE) The Xpert Xpress SARS-CoV-2/FLU/RSV assay is intended as an aid in  the diagnosis of influenza from Nasopharyngeal swab specimens and  should not be used as a sole basis for treatment. Nasal washings and  aspirates are unacceptable for Xpert Xpress SARS-CoV-2/FLU/RSV  testing.  Fact Sheet for Patients: https://www.moore.com/  Fact Sheet for Healthcare Providers: https://www.young.biz/  This test is not yet approved or cleared by the [place] FDA and  has been authorized for detection and/or diagnosis of SARS-CoV-2 by  FDA under an Emergency Use Authorization (EUA). This EUA will remain  in effect (meaning this test can be used) for the duration of the  Covid-19 declaration under Section 564(b)(1) of the Act, 21  U.S.C. section 360bbb-3(b)(1), unless the authorization is  terminated or revoked. Performed at [facility], [address] [place], [place] [address]   CULTURE, BLOOD (ROUTINE X 2) w Reflex to ID Panel     Status: None (Preliminary result)   Collection Time: 11/01/19  2:10 PM   Specimen: BLOOD RIGHT ARM  Result Value Ref Range Status   Specimen Description BLOOD RIGHT ARM  Final   Special Requests   Final    BOTTLES DRAWN AEROBIC AND ANAEROBIC Blood Culture results may not be optimal  due to an inadequate volume of blood received in culture bottles   Culture   Final    NO GROWTH 3 DAYS Performed at [facility], [address] [place], [place] [address]    Report Status PENDING  Incomplete  CULTURE, BLOOD (ROUTINE X 2) w Reflex to ID Panel     Status: None (Preliminary result)   Collection Time: 11/01/19  2:10 PM   Specimen: BLOOD RIGHT ARM  Result Value Ref Range Status   Specimen Description BLOOD RIGHT ARM  Final   Special Requests   Final    BOTTLES DRAWN  AEROBIC AND ANAEROBIC Blood Culture adequate volume   Culture   Final    NO GROWTH 3 DAYS Performed at [facility], [address] [place], [place] [address]    Report Status PENDING  Incomplete  Surgical PCR screen     Status: None   Collection Time: 11/01/19  8:04 PM   Specimen: Nasal Mucosa; Nasal Swab  Result Value Ref Range Status   MRSA, PCR NEGATIVE NEGATIVE Final   Staphylococcus aureus NEGATIVE NEGATIVE Final    Comment: (NOTE) The Xpert SA Assay (FDA approved for NASAL specimens in patients 58 years of age and older), is one component of a comprehensive surveillance program. It is not intended to diagnose infection nor to guide or monitor treatment. Performed at [facility], [address] [place], [place] [address]   Aerobic/Anaerobic Culture (surgical/deep wound)     Status: None (Preliminary result)   Collection Time: 11/02/19 12:15 PM   Specimen: Foot, Left; Abscess  Result Value Ref Range Status   Specimen Description   Final    FOOT LEFT Performed at [facility], [address] [place], [place] [address]    Special Requests   Final    NONE Performed at [facility], [address] [place], [place] [address]    Gram Stain NO WBC SEEN NO ORGANISMS SEEN   Final   Culture   Final    RARE STAPHYLOCOCCUS AUREUS CULTURE REINCUBATED FOR BETTER GROWTH HOLDING FOR POSSIBLE ANAEROBE Performed at [facility], [address] [place], [place] [address]    Report Status PENDING  Incomplete  Anaerobic culture     Status: None   Collection Time: 11/02/19 12:16 PM   Specimen: Foot, Left; Tissue  Result Value Ref Range Status   Specimen Description   Final    FOOT LEFT Performed at [facility], [address] [place], [place] [address]    Special Requests   Final    NONE Performed at [facility], [address] [place], [place] [address]    Gram Stain   Final    RARE WBC PRESENT,  PREDOMINANTLY PMN ABUNDANT GRAM POSITIVE COCCI MODERATE GRAM NEGATIVE RODS Performed at [facility], [address] [place], [place] [address]    Culture   Final    MIXED ANAEROBIC FLORA PRESENT.  CALL LAB IF FURTHER IID REQUIRED.   Report Status 11/04/2019 FINAL  Final    IMAGING RESULTS: MRI of the foot reviewed I have personally reviewed the films ? Impression/Recommendation ? ?Diabetic foot infection of the left foot  With underlying PAD Sesamoid bone osteo and wound- s/p debridement of abscess and removal of tibial sesamoid Staph aureus in culture- pending susceptibility On ceftriaxone- Will dc that- start unasyn for anerobe coverage and vanco. Depending on the susceptibiltiy and pathology will decide on Iv VS po and  duration. Possible that he may need IV antibiotic DM- says Hba1c is better controlled and < 6 H/o PAD  Hyperlipidemia on statin ? ___________________________________________________ Discussed with patient,and care team Note:  This document was prepared using Dragon voice recognition s oftware and may include unintentional dictation errors.

## 2019-11-05 ENCOUNTER — Encounter
Admission: EM | Disposition: A | Discharge status: Home Health | Payer: Self-pay | Source: Ambulatory Visit | Attending: Internal Medicine

## 2019-11-05 ENCOUNTER — Encounter: Payer: Self-pay | Admitting: Internal Medicine

## 2019-11-05 DIAGNOSIS — L089 Local infection of the skin and subcutaneous tissue, unspecified: Secondary | ICD-10-CM | POA: Diagnosis not present

## 2019-11-05 DIAGNOSIS — I70262 Atherosclerosis of native arteries of extremities with gangrene, left leg: Secondary | ICD-10-CM | POA: Diagnosis not present

## 2019-11-05 HISTORY — PX: LOWER EXTREMITY ANGIOGRAPHY: CATH118251

## 2019-11-05 LAB — CBC
HCT: 45.2 % (ref 39.0–52.0)
Hemoglobin: 14.8 g/dL (ref 13.0–17.0)
MCH: 28.1 pg (ref 26.0–34.0)
MCHC: 32.7 g/dL (ref 30.0–36.0)
MCV: 85.8 fL (ref 80.0–100.0)
Platelets: 261 10*3/uL (ref 150–400)
RBC: 5.27 MIL/uL (ref 4.22–5.81)
RDW: 13.1 % (ref 11.5–15.5)
WBC: 7 10*3/uL (ref 4.0–10.5)
nRBC: 0 % (ref 0.0–0.2)

## 2019-11-05 LAB — BASIC METABOLIC PANEL
Anion gap: 9 (ref 5–15)
BUN: 18 mg/dL (ref 6–20)
CO2: 27 mmol/L (ref 22–32)
Calcium: 9 mg/dL (ref 8.9–10.3)
Chloride: 102 mmol/L (ref 98–111)
Creatinine, Ser: 0.85 mg/dL (ref 0.61–1.24)
GFR, Estimated: >60 mL/min (ref >60)
Glucose, Bld: 133 mg/dL — ABNORMAL HIGH (ref 70–99)
Potassium: 4.1 mmol/L (ref 3.5–5.1)
Sodium: 138 mmol/L (ref 135–145)

## 2019-11-05 LAB — GLUCOSE, CAPILLARY
Glucose-Capillary: 113 mg/dL — ABNORMAL HIGH (ref 70–99)
Glucose-Capillary: 110 mg/dL — ABNORMAL HIGH (ref 70–99)
Glucose-Capillary: 115 mg/dL — ABNORMAL HIGH (ref 70–99)
Glucose-Capillary: 119 mg/dL — ABNORMAL HIGH (ref 70–99)
Glucose-Capillary: 118 mg/dL — ABNORMAL HIGH (ref 70–99)

## 2019-11-05 LAB — SURGICAL PATHOLOGY

## 2019-11-05 SURGERY — LOWER EXTREMITY ANGIOGRAPHY
Anesthesia: Moderate Sedation | Laterality: Left

## 2019-11-05 MED ORDER — MIDAZOLAM HCL 2 MG/ML PO SYRP: 8.0000 mg | ORAL_SOLUTION | Freq: Once | ORAL | Status: DC | PRN

## 2019-11-05 MED ORDER — FENTANYL CITRATE (PF) 100 MCG/2ML IJ SOLN
INTRAMUSCULAR | Status: AC
  Filled 2019-11-05: qty 2

## 2019-11-05 MED ORDER — METHYLPREDNISOLONE SODIUM SUCC 125 MG IJ SOLR: 125.0000 mg | Freq: Once | INTRAMUSCULAR | Status: DC | PRN

## 2019-11-05 MED ORDER — VANCOMYCIN HCL 1750 MG/350ML IV SOLN
1750.0000 mg | Freq: Two times a day (BID) | INTRAVENOUS | Status: DC
  Administered 2019-11-06 (×2): 1750 mg via INTRAVENOUS
  Filled 2019-11-05 (×4): qty 350

## 2019-11-05 MED ORDER — HYDROMORPHONE HCL 1 MG/ML IJ SOLN
1.0000 mg | Freq: Once | INTRAMUSCULAR | Status: AC | PRN
  Administered 2019-11-05: 1 mg via INTRAVENOUS
  Filled 2019-11-05: qty 1

## 2019-11-05 MED ORDER — HEPARIN SODIUM (PORCINE) 1000 UNIT/ML IJ SOLN
INTRAMUSCULAR | Status: AC
  Filled 2019-11-05: qty 1

## 2019-11-05 MED ORDER — ONDANSETRON HCL 4 MG/2ML IJ SOLN: 4.0000 mg | Freq: Four times a day (QID) | INTRAMUSCULAR | Status: DC | PRN

## 2019-11-05 MED ORDER — CEFAZOLIN SODIUM-DEXTROSE 2-4 GM/100ML-% IV SOLN: 2.0000 g | Freq: Once | INTRAVENOUS | Status: DC

## 2019-11-05 MED ORDER — FENTANYL CITRATE (PF) 100 MCG/2ML IJ SOLN
INTRAMUSCULAR | Status: DC | PRN
  Administered 2019-11-05: 50 ug via INTRAVENOUS
  Administered 2019-11-05 (×2): 25 ug via INTRAVENOUS

## 2019-11-05 MED ORDER — HEPARIN SODIUM (PORCINE) 1000 UNIT/ML IJ SOLN
INTRAMUSCULAR | Status: DC | PRN
  Administered 2019-11-05: 5000 [IU] via INTRAVENOUS

## 2019-11-05 MED ORDER — MIDAZOLAM HCL 2 MG/2ML IJ SOLN
INTRAMUSCULAR | Status: DC | PRN
  Administered 2019-11-05: 1 mg via INTRAVENOUS
  Administered 2019-11-05: 2 mg via INTRAVENOUS
  Administered 2019-11-05: 0.5 mg via INTRAVENOUS

## 2019-11-05 MED ORDER — MIDAZOLAM HCL 5 MG/5ML IJ SOLN
INTRAMUSCULAR | Status: AC
  Filled 2019-11-05: qty 5

## 2019-11-05 MED ORDER — CEFAZOLIN SODIUM-DEXTROSE 2-4 GM/100ML-% IV SOLN
INTRAVENOUS | Status: AC
  Filled 2019-11-05: qty 100

## 2019-11-05 MED ORDER — SODIUM CHLORIDE 0.9 % IV SOLN: INTRAVENOUS | Status: DC

## 2019-11-05 MED ORDER — DIPHENHYDRAMINE HCL 50 MG/ML IJ SOLN: 50.0000 mg | Freq: Once | INTRAMUSCULAR | Status: DC | PRN

## 2019-11-05 MED ORDER — IODIXANOL 320 MG/ML IV SOLN
INTRAVENOUS | Status: DC | PRN
  Administered 2019-11-05: 75 mL

## 2019-11-05 MED ORDER — FAMOTIDINE 20 MG PO TABS: 40.0000 mg | ORAL_TABLET | Freq: Once | ORAL | Status: DC | PRN

## 2019-11-05 SURGICAL SUPPLY — 30 items
BALLN COYOTE OTW 2X100X150 (BALLOONS) ×3
BALLN LUTONIX DCB 6X40X130 (BALLOONS) ×3
BALLN ULTRASCORE 014 3X200X150 (BALLOONS) ×3
BALLN ULTRSCOR 014 2.5X200X150 (BALLOONS) ×3
BALLN ULTRVRSE 2X300X150 (BALLOONS) ×3
BALLN ULTRVRSE 2X300X150 OTW (BALLOONS) ×1
BALLN ULTRVRSE 3X300X150 (BALLOONS) ×3
BALLN ULTRVRSE 3X300X150 OTW (BALLOONS) ×1
BALLOON COYOTE OTW 2X100X150 (BALLOONS) IMPLANT
BALLOON LUTONIX DCB 6X40X130 (BALLOONS) IMPLANT
BALLOON ULTRSC 014 2.5X200X150 (BALLOONS) IMPLANT
BALLOON ULTRSCRE 014 3X200X150 (BALLOONS) IMPLANT
BALLOON ULTRVRSE 2X300X150 OTW (BALLOONS) IMPLANT
BALLOON ULTRVRSE 3X300X150 OTW (BALLOONS) IMPLANT
CATH ANGIO 5F PIGTAIL 65CM (CATHETERS) ×2 IMPLANT
CATH SEEKER .018X150 (CATHETERS) ×2 IMPLANT
CATH SEEKER .035X135CM (CATHETERS) ×2 IMPLANT
DEVICE STARCLOSE SE CLOSURE (Vascular Products) ×2 IMPLANT
DEVICE TORQUE .025-.038 (MISCELLANEOUS) ×2 IMPLANT
GLIDEWIRE ADV .035X260CM (WIRE) ×2 IMPLANT
KIT ENCORE 26 ADVANTAGE (KITS) ×2 IMPLANT
NDL ENTRY 21GA 7CM ECHOTIP (NEEDLE) IMPLANT
NEEDLE ENTRY 21GA 7CM ECHOTIP (NEEDLE) ×3 IMPLANT
PACK ANGIOGRAPHY (CUSTOM PROCEDURE TRAY) ×3 IMPLANT
SET INTRO CAPELLA COAXIAL (SET/KITS/TRAYS/PACK) ×2 IMPLANT
SHEATH BRITE TIP 5FRX11 (SHEATH) ×2 IMPLANT
SHEATH RAABE 7FR (SHEATH) ×2 IMPLANT
WIRE G V18X300CM (WIRE) ×2 IMPLANT
WIRE J 3MM .035X145CM (WIRE) ×2 IMPLANT
WIRE RUNTHROUGH .014X300CM (WIRE) ×2 IMPLANT

## 2019-11-05 NOTE — Progress Notes (Signed)
[facility] Progress Note  Patient: Reginald Nielsen    GYB:[id number]  DOA: 11/01/2019     Date of Service: the patient was seen and examined on 11/05/2019  Brief hospital course: Iren Whipp Mitchellis a 59 y.o.malewith medical history significant ofhypertension, hyperlipidemia, diabetes mellitus, PVD, glaucoma, who presents with left foot infection.  Currently plan is postop recovery and modification of IV antibiotics.  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  Patient failed outpatient treatment includingantibiotic treatment.  No fever or leukocytosis. Clinically not septic.  Dr. Alberteen Spindle ofpodiatry is consulted. Underwent incision and drainage with debridement. Underwent angioplasty as well. Currently will go for another incision and debridement tomorrow. Currently growing staph aureus as well as an anaerobe. ID consulted.  Appreciate assistance Initially on ceftriaxone.  Now on vancomycin and Unasyn.  Monitor results of the sensitivities.  DM (diabetes mellitus), type 2 with peripheral vascular complications [facility]): Reported neuropathy pain bilaterally. Recent A1c 5.3, well controlled. Patient is taking Metformin and glipizide -SSI Add gabapentin 300 mg.  100 mg 3 times daily.  History ofPAD (peripheral artery disease) [facility]): Recent angiography August 2021 PTCA of left anterior tibial and peroneal arteries. Poor circulation seen at the time of the debridement Vascular surgery consulted. Underwent angiography as well as angioplasty on 11/05/2019.  HLD (hyperlipidemia) -Lipitor  Hypertension: Pt is not taking medications currently.  -IV hydralazine as needed  Hyperlipidemia. Continue Lipitor.  Diet: Cardiac diet DVT Prophylaxis:   SCDs Start: 11/01/19 1329   Advance goals of care discussion: Full code  Family Communication: no family  was present at bedside, at the time of interview.   Disposition:  Status is: Inpatient  Remains inpatient appropriate because:Ongoing diagnostic testing needed not appropriate for outpatient work up   Dispo: The patient is from: Home              Anticipated d/c is to: Home              Anticipated d/c date is: > 3 days              Patient currently is not medically stable to d/c.  Subjective: No nausea no vomiting.  No fever no chills.  No chest pain.  No abdominal pain.  Physical Exam:  General: Appear in mild distress, no Rash; Oral Mucosa Clear, moist. no Abnormal Neck Mass Or lumps, Conjunctiva normal  Cardiovascular: S1 and S2 Present, no Murmur, Respiratory: good respiratory effort, Bilateral Air entry present and CTA, no Crackles, no wheezes Abdomen: Bowel Sound present, Soft and no tenderness Extremities: no Pedal edema Neurology: alert and oriented to time, place, and person affect appropriate. no new focal deficit Gait not checked due to patient safety concerns  Vitals:   11/05/19 1230 11/05/19 1245 11/05/19 1336 11/05/19 1602  BP:  (!) 127/92 (!) 119/92 137/87  Pulse: (!) 50 (!) 54 61 61  Resp: 16 12 16 18   Temp:   (!) 97.2 F (36.2 C) 98 F (36.7 C)  TempSrc:      SpO2: 96% 99% 95% 100%  Weight:      Height:        Intake/Output Summary (Last 24 hours) at 11/05/2019 1750 Last data filed at 11/05/2019 1100 Gross per 24 hour  Intake 120 ml  Output 865 ml  Net -745 ml   Filed Weights   11/01/19 1139 11/05/19 1016  Weight: 99.8 kg 99.8 kg  Data Reviewed: I have personally reviewed and interpreted daily labs, tele strips, imagings as discussed above. I reviewed all nursing notes, pharmacy notes, vitals, pertinent old records I have discussed plan of care as described above with RN and patient/family.  CBC: Recent Labs  Lab 11/01/19 1142 11/02/19 0640 11/03/19 0840 11/04/19 0425 11/05/19 0638  WBC 10.4 8.1 9.9 7.3 7.0  NEUTROABS 7.7  --   --    --   --   HGB 14.7 13.7 13.9 13.9 14.8  HCT 44.7 41.1 42.3 42.3 45.2  MCV 84.8 84.4 84.1 84.8 85.8  PLT 206 186 224 228 261   Basic Metabolic Panel: Recent Labs  Lab 11/01/19 1142 11/02/19 0640 11/03/19 0840 11/04/19 0425 11/05/19 0638  NA 135 135 136 140 138  K 4.2 4.1 4.4 4.3 4.1  CL 99 101 99 104 102  CO2 25 24 28 27 27   GLUCOSE 140* 126* 162* 144* 133*  BUN 23* 16 20 19 18   CREATININE 0.92 1.14 0.82 0.86 0.85  CALCIUM 9.4 8.9 9.1 9.1 9.0    Studies: PERIPHERAL VASCULAR CATHETERIZATION  Result Date: 11/05/2019 See Op Note   Scheduled Meds: . aspirin EC  81 mg Oral Daily  . atorvastatin  10 mg Oral Daily  . brimonidine  1 drop Both Eyes BID   And  . timolol  1 drop Both Eyes BID  . fentaNYL      . gabapentin  100 mg Oral TID  . heparin sodium (porcine)      . influenza vac split quadrivalent PF  0.5 mL Intramuscular Tomorrow-1000  . insulin aspart  0-5 Units Subcutaneous QHS  . insulin aspart  0-9 Units Subcutaneous TID WC  . lactulose  20 g Oral Daily  . latanoprost  1 drop Both Eyes QHS  . midazolam      . pneumococcal 23 valent vaccine  0.5 mL Intramuscular Tomorrow-1000   Continuous Infusions: . sodium chloride 75 mL/hr at 11/04/19 2357  . ampicillin-sulbactam (UNASYN) IV 3 g (11/05/19 1321)  . ceFAZolin    . [START ON 11/06/2019] vancomycin     PRN Meds: acetaminophen, hydrALAZINE, morphine injection, ondansetron (ZOFRAN) IV, ondansetron (ZOFRAN) IV, oxyCODONE-acetaminophen  Time spent: 35 minutes  Author: 11/07/19, MD [facility] 11/05/2019 5:50 PM  To reach On-call, see care teams to locate the attending and reach out via [email or web address]. Between 7PM-7AM, please contact night-coverage If you still have difficulty reaching the attending provider, please page the [facility] (Director on Call) for [facility] on amion for assistance.

## 2019-11-05 NOTE — Op Note (Signed)
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 Introduction catheter into left lower extremity 3rd order catheter placement  2. Contrast injection left lower extremity for distal runoff  3. Percutaneous transluminal angioplasty left superficial femoral 4. Percutaneous transluminal angioplasty to 3 mm left anterior tibial and additional angioplasty of the left posterior tibial to 3 mm  5. Star close closure right common femoral arteriotomy  Anesthesia: Conscious sedation was administered under my direct supervision by the interventional radiology RN. IV Versed plus fentanyl were utilized. Continuous ECG, pulse oximetry and blood pressure was monitored throughout the entire procedure.  Conscious sedation was for a total of 86 minutes.  Sheath: 7 Fr Raby retrograde right   Contrast: 75 cc  Fluoroscopy Time: 14.6 minutes  Indications: Reginald Nielsen presents with increasing pain of the left lower extremity.  This is associated with gangrenous changes of the first ray along the plantar surface.  This suggests the patient is having limb threatening ischemia. The risks and benefits are reviewed all questions answered patient agrees to proceed.  Procedure:Reginald Nielsen is a 59 y.o. y.o. male who was identified and appropriate procedural time out was performed. The patient was then placed supine on the table and prepped and draped in the usual sterile fashion.   Ultrasound was placed in the sterile sleeve and the right groin was evaluated the right common femoral artery was echolucent and pulsatile indicating patency. Image was recorded for the  permanent record and under real-time visualization a microneedle was inserted into the common femoral artery followed by the microwire and then the micro-sheath. A J-wire was then advanced through the micro-sheath and a 5 [place] sheath was then inserted over a J-wire. J-wire was then advanced and a 5 French pigtail catheter was positioned at the level of the aortic bifurcation.  Subsequently a pig tail catheter with the stiff angle Glidewire was used to cross the aortic bifurcation the catheter wire were advanced down into the left distal external iliac artery. Oblique view of the femoral bifurcation was then obtained and subsequently the wire was reintroduced and the pigtail catheter negotiated into the SFA representing third order catheter placement. Distal runoff was then performed.  Diagnostic interpretation: The distal aorta and the aortic bifurcation is widely patent.  The bilateral common iliac arteries are widely patent.  The left internal and external iliac arteries are widely patent.  The left common femoral and profunda femoris is widely patent.  The left superficial femoral artery demonstrates a 65 to 70% stenosis at Hunter's canal this is a focal lesion.  The SFA is diffusely diseased but there are no other hemodynamically significant stenoses noted.  The popliteal is diffusely diseased but patent without hemodynamically significant stenosis.  Trifurcation is extensively diseased with diffuse disease of all 3 tibial arteries with occlusions of all 3 tibial arteries.  There appears to be distal reconstitution of the anterior tibial, posterior tibial and the peroneal.  5000 units of heparin was then given and allowed to circulate and a 7 [place] Raby sheath was advanced up and over the bifurcation and positioned in the femoral artery  Wire was then advanced under fluoroscopic guidance down into the distal popliteal.  A 6 mm x 40 mm Lutonix drug-eluting balloon was advanced across the SFA lesion  at Hunter's canal inflation was to  12 atm for 1 minute.  Follow-up imaging demonstrated 15% residual stenosis.  Attention was then turned to the trifurcation.  Initially the wire was and seeker catheter were negotiated into the peroneal however I could not reenter distally.  I then turned my attention to the anterior tibial was able to negotiate the seeker catheter and the wire down into the dorsalis pedis fully crossing all of the stenoses and the occlusion of the anterior tibial.  Initially this was treated with a 2 mm balloon inflation following this a 2.5 mm ultra score balloon was used to treat the anterior tibial.  Multiple inflations were required inflations were to 10 to 12 atm for approximately 1 minute.  Lastly a 3 mm x 30 mm Ultraverse balloon was used to treat the anterior tibial from the proximal dorsalis pedis all the way through to the popliteal.  Serial inflations were required inflations again were 10 to 12 atm for 1 minute.  Follow-up imaging of the anterior tibial now demonstrates wide patency with less than 10% residual stenosis throughout the entire length.  Following successful recanalization of the anterior tibial I turned my attention of the posterior tibial and negotiated a V 18 wire and an 018 seeker catheter down into the plantar vessels.  Again beginning with a 2 mm balloon performing serial inflations the entire length of the posterior tibial was treated.  Next the 3 mm x 30 mm Ultraverse balloon was used to treat the posterior tibial from its distalmost aspect all the way through the origin.  Multiple inflations were required inflations were to 12 to 14 atm for 1 full minute each.  Follow-up imaging demonstrated less than 10% residual stenosis with rapid flow contrast now filling the pedal arch from a femoral injection through both the anterior tibial and posterior tibial.  After review of these images the sheath is pulled into the right external iliac oblique of the common femoral  is obtained and a Star close device deployed. There no immediate Complications.  Findings:  The distal aorta and the aortic bifurcation is widely patent.  The bilateral common iliac arteries are widely patent.  The left internal and external iliac arteries are widely patent.  The left common femoral and profunda femoris is widely patent.  The left superficial femoral artery demonstrates a 65 to 70% stenosis at Hunter's canal this is a focal lesion.  The SFA is diffusely diseased but there are no other hemodynamically significant stenoses noted.  The popliteal is diffusely diseased but patent without hemodynamically significant stenosis.  Trifurcation is extensively diseased with diffuse disease of all 3 tibial arteries with occlusions of all 3 tibial arteries.  There appears to be distal reconstitution of the anterior tibial, posterior tibial and the peroneal.  Following angioplasty of the distal SFA at Hunter's canal to 6 mm there is now less than 50% residual stenosis.  Following angioplasty of the left anterior tibial there now is in-line flow and looks quite nice with less than 10% residual stenosis. Angioplasty of the left posterior tibial yields an excellent result with less than 10% residual stenosis.  Summary: Successful recanalization left lower extremity for limb salvage   Disposition: Patient was taken to the recovery room in stable condition having tolerated the procedure well.  Reginald Nielsen 11/05/2019,12:09 PM

## 2019-11-05 NOTE — Interval H&P Note (Signed)
History and Physical Interval Note:  11/05/2019 9:53 AM  Reginald Nielsen  has presented today for surgery, with the diagnosis of Left Foot.  The various methods of treatment have been discussed with the patient and family. After consideration of risks, benefits and other options for treatment, the patient has consented to  Procedure(s): IRRIGATION AND DEBRIDEMENT FOOT (Left) as a surgical intervention.  The patient's history has been reviewed, patient examined, no change in status, stable for surgery.  I have reviewed the patient's chart and labs.  Questions were answered to the patient's satisfaction.     Levora Dredge

## 2019-11-05 NOTE — Progress Notes (Signed)
Patient ID: Reginald Nielsen, male   DOB: [birth year], 59 y.o.   MRN: [id number] Patient seen this afternoon after his revascularization procedure earlier today.  Spoke with Dr. Gilda Crease who stated he was able to open up a couple of his blood vessels to significantly increase his circulation to the left foot.  Discussed with the patient that at this point I think it would be his best timing to go in and redebride the necrotic areas around his original incision and wound to give him his best chance for healing.  Patient is in agreement with this.  Discussed with the patient risks of the procedure including continued inability to heal due to diabetes, blood flow, or residual infection.  No guarantees could be given as to the outcome.  Patient elects to proceed with surgery which we will schedule for tomorrow evening.  N.p.o. after breakfast tomorrow.  Consent form for debridement wound left foot.  Plan for surgery tomorrow evening.

## 2019-11-05 NOTE — Consult Note (Addendum)
Pharmacy Antibiotic Note  Reginald Nielsen is a 59 y.o. male with medical history including PVD, diabetes admitted on 11/01/2019 with abscess of left foot with osteomyelitis. Patient empirically started on ceftriaxone. Would cultures now growing Staphylococcus aureus, anaerobic flora. Pharmacy has been consulted for vancomycin and ampicillin/sulbactam dosing.  Total days of antibiotic treatment: 5 (pt was on Ceftriaxone for 4 days)  Total days of Unasyn: 2  Total days of Vancomycin: 2  Plan:  1. Continue Ampicillin/sulbactam 3 g IV q6h  2. Will adjust Vancomycin to 1750 mg Q12h, expected Cssmin 12.2 (previous dose of 1g q8H provided the same expected Cssmin of 12). Will obtain vancomycin trough before 4th of 5th dose.  --Goal trough 15 - 20 mcg/mL. --Daily Scr per protocol --Levels at steady state as indicated  Height: 6\' 5"  (195.6 cm) Weight: 99.8 kg (220 lb 0.3 oz) IBW/kg (Calculated) : 89.1  Temp (24hrs), Avg:97.9 F (36.6 C), Min:97.2 F (36.2 C), Max:98.4 F (36.9 C)  Recent Labs  Lab 11/01/19 1142 11/02/19 0640 11/03/19 0840 11/04/19 0425 11/05/19 0638  WBC 10.4 8.1 9.9 7.3 7.0  CREATININE 0.92 1.14 0.82 0.86 0.85  LATICACIDVEN 1.1  --   --   --   --     Estimated Creatinine Clearance: 119.4 mL/min (by C-G formula based on SCr of 0.85 mg/dL).    No Known Allergies  Antimicrobials this admission: Ceftriaxone 10/22 >> 10/25 Ampicillin/sulbactam 10/25 >>  Vancomycin 10/25 >>   Dose adjustments this admission: N/A  Microbiology results: 10/22 BCx: NGTD 10/23 Wound culture: Staphylococcus aureus, holding for anaerobe 10/23 Tissue culture: GPC, GNR, mixed anaerobic flora  Thank you for allowing pharmacy to be a part of this patient's care.  11/23 11/05/2019 1:55 PM

## 2019-11-05 NOTE — Progress Notes (Signed)
Sepsis screen performed and is negative. Will continue to monitor. 

## 2019-11-05 NOTE — Care Management Important Message (Signed)
Important Message  Patient Details  Name: Reginald Nielsen MRN: [id number] Date of Birth: [birth year]   Medicare Important Message Given:  N/A - LOS <3 / Initial given by admissions  Initial Medicare IM reviewed with patient by Jennye Moccasin, Patient Access Associate on 11/04/2019 at 11:58am.    Johnell Comings 11/05/2019, 9:01 AM

## 2019-11-05 NOTE — Plan of Care (Addendum)
Pt Axox4. Calm and cooperative and able to voice his needs. Vitals stable. On IV Abx and IVF. On RA. L foot dressing CDI. Prn oxycodone 2 tablets administered for pain. Effective. Pt able to ambulate to restroom to have a BM, small, loose. Otherwise pt uses the urinal. NPO since MN for LLE angiogram. Safety measures in place. Will continue to monitor.  Problem: Education: Goal: Knowledge of General Education information will improve Description: Including pain rating scale, medication(s)/side effects and non-pharmacologic comfort measures Outcome: Progressing   Problem: Health Behavior/Discharge Planning: Goal: Ability to manage health-related needs will improve Outcome: Progressing   Problem: Clinical Measurements: Goal: Ability to maintain clinical measurements within normal limits will improve Outcome: Progressing Goal: Will remain free from infection Outcome: Progressing Goal: Diagnostic test results will improve Outcome: Progressing Goal: Respiratory complications will improve Outcome: Progressing Goal: Cardiovascular complication will be avoided Outcome: Progressing   Problem: Activity: Goal: Risk for activity intolerance will decrease Outcome: Progressing   Problem: Nutrition: Goal: Adequate nutrition will be maintained Outcome: Progressing   Problem: Coping: Goal: Level of anxiety will decrease Outcome: Progressing   Problem: Elimination: Goal: Will not experience complications related to bowel motility Outcome: Progressing Goal: Will not experience complications related to urinary retention Outcome: Progressing   Problem: Pain Managment: Goal: General experience of comfort will improve Outcome: Progressing   Problem: Safety: Goal: Ability to remain free from injury will improve Outcome: Progressing   Problem: Skin Integrity: Goal: Risk for impaired skin integrity will decrease Outcome: Progressing

## 2019-11-05 NOTE — Progress Notes (Signed)
Report called to [facility] . Patient with star closure:  HOB up at 1330  OOB at 1400.  Patient sleeping soundly, HR 47-50, no c/o's at present.

## 2019-11-05 NOTE — H&P (View-Only) (Signed)
Patient ID: Reginald Nielsen, male   DOB: 01/04/1961, 59 y.o.   MRN: [id number] Patient seen this afternoon after his revascularization procedure earlier today.  Spoke with Dr. Schnier who stated he was able to open up a couple of his blood vessels to significantly increase his circulation to the left foot.  Discussed with the patient that at this point I think it would be his best timing to go in and redebride the necrotic areas around his original incision and wound to give him his best chance for healing.  Patient is in agreement with this.  Discussed with the patient risks of the procedure including continued inability to heal due to diabetes, blood flow, or residual infection.  No guarantees could be given as to the outcome.  Patient elects to proceed with surgery which we will schedule for tomorrow evening.  N.p.o. after breakfast tomorrow.  Consent form for debridement wound left foot.  Plan for surgery tomorrow evening. 

## 2019-11-05 NOTE — Progress Notes (Addendum)
Day of Surgery   Subjective/Chief Complaint: Patient seen.  Overall doing okay.  States IV came out last night.  Was able to shower yesterday.   Objective: Vital signs in last 24 hours: Temp:  [97.7 F (36.5 C)-98.4 F (36.9 C)] 97.8 F (36.6 C) (10/26 1016) Pulse Rate:  [51-62] 51 (10/26 1135) Resp:  [12-18] 18 (10/26 1105) BP: (105-144)/(66-93) 120/72 (10/26 1135) SpO2:  [97 %-100 %] 100 % (10/26 1135) Weight:  [99.8 kg] 99.8 kg (10/26 1016) Last BM Date: 11/05/19  Intake/Output from previous day: 10/25 0701 - 10/26 0700 In: 120 [P.O.:120] Out: 440 [Urine:440] Intake/Output this shift: No intake/output data recorded.  Bandage on the left foot is dry and intact.  Upon removal there is still a mild area of bleeding from the plantar wound with packing intact.  No significant purulence noted.  Some progressive necrosis is noted around the distal aspect of the incision.  Dorsal erythematous changes appear to be improving.      Lab Results:  Recent Labs    11/04/19 0425 11/05/19 0638  WBC 7.3 7.0  HGB 13.9 14.8  HCT 42.3 45.2  PLT 228 261   BMET Recent Labs    11/04/19 0425 11/05/19 0638  NA 140 138  K 4.3 4.1  CL 104 102  CO2 27 27  GLUCOSE 144* 133*  BUN 19 18  CREATININE 0.86 0.85  CALCIUM 9.1 9.0   PT/INR No results for input(s): LABPROT, INR in the last 72 hours. ABG No results for input(s): PHART, HCO3 in the last 72 hours.  Invalid input(s): PCO2, PO2  Studies/Results: No results found.  Anti-infectives: Anti-infectives (From admission, onward)   Start     Dose/Rate Route Frequency Ordered Stop   11/05/19 1000  ceFAZolin (ANCEF) IVPB 2g/100 mL premix       Note to Pharmacy: To be given in specials   2 g 200 mL/hr over 30 Minutes Intravenous  Once 11/05/19 0959     11/05/19 0928  ceFAZolin (ANCEF) 2-4 GM/100ML-% IVPB       Note to Pharmacy: [place] Lathe  : cabinet override      11/05/19 0928 11/05/19 2144   11/05/19 0600  [MAR Hold]   vancomycin (VANCOCIN) IVPB 1000 mg/200 mL premix        (MAR Hold since Tue 11/05/2019 at 0957.Hold Reason: Transfer to a Procedural area.)   1,000 mg 200 mL/hr over 60 Minutes Intravenous Every 8 hours 11/04/19 1910     11/04/19 2000  [MAR Hold]  Ampicillin-Sulbactam (UNASYN) 3 g in sodium chloride 0.9 % 100 mL IVPB        (MAR Hold since Tue 11/05/2019 at 0957.Hold Reason: Transfer to a Procedural area.)   3 g 200 mL/hr over 30 Minutes Intravenous Every 6 hours 11/04/19 1843     11/04/19 2000  vancomycin (VANCOREADY) IVPB 2000 mg/400 mL        2,000 mg 200 mL/hr over 120 Minutes Intravenous  Once 11/04/19 1845 11/04/19 2247   11/01/19 1400  cefTRIAXone (ROCEPHIN) 1 g in sodium chloride 0.9 % 100 mL IVPB  Status:  Discontinued        1 g 200 mL/hr over 30 Minutes Intravenous Every 24 hours 11/01/19 1330 11/04/19 1830   11/01/19 1345  vancomycin (VANCOREADY) IVPB 2000 mg/400 mL  Status:  Discontinued        2,000 mg 200 mL/hr over 120 Minutes Intravenous  Once 11/01/19 1300 11/01/19 1327   11/01/19 1300  piperacillin-tazobactam (ZOSYN)  IVPB 3.375 g  Status:  Discontinued        3.375 g 100 mL/hr over 30 Minutes Intravenous  Once 11/01/19 1256 11/01/19 1327      Assessment/Plan: s/p Procedure(s): Lower Extremity Angiography (Left) Assessment: Status post I&D left foot with some continued necrosis.   Plan: The wound was repacked with iodoform gauze under aseptic technique followed by a bulky gauze bandage.  Patient scheduled for angiogram today with repeat intervention.  Continue to evaluate closely for potential need for any further surgery.  LOS: 3 days    [name] 11/05/2019

## 2019-11-06 ENCOUNTER — Encounter: Payer: Self-pay | Admitting: Internal Medicine

## 2019-11-06 ENCOUNTER — Encounter
Admission: EM | Disposition: A | Discharge status: Home Health | Payer: Self-pay | Source: Ambulatory Visit | Attending: Internal Medicine

## 2019-11-06 ENCOUNTER — Inpatient Hospital Stay: Payer: PPO | Admitting: Anesthesiology

## 2019-11-06 DIAGNOSIS — E785 Hyperlipidemia, unspecified: Secondary | ICD-10-CM | POA: Diagnosis not present

## 2019-11-06 DIAGNOSIS — M8618 Other acute osteomyelitis, other site: Secondary | ICD-10-CM | POA: Diagnosis not present

## 2019-11-06 DIAGNOSIS — L02612 Cutaneous abscess of left foot: Secondary | ICD-10-CM

## 2019-11-06 DIAGNOSIS — M86172 Other acute osteomyelitis, left ankle and foot: Secondary | ICD-10-CM | POA: Diagnosis not present

## 2019-11-06 DIAGNOSIS — I739 Peripheral vascular disease, unspecified: Secondary | ICD-10-CM | POA: Diagnosis not present

## 2019-11-06 DIAGNOSIS — E1151 Type 2 diabetes mellitus with diabetic peripheral angiopathy without gangrene: Secondary | ICD-10-CM | POA: Diagnosis not present

## 2019-11-06 DIAGNOSIS — L089 Local infection of the skin and subcutaneous tissue, unspecified: Secondary | ICD-10-CM | POA: Diagnosis not present

## 2019-11-06 HISTORY — PX: IRRIGATION AND DEBRIDEMENT FOOT: SHX6602

## 2019-11-06 LAB — BASIC METABOLIC PANEL
Anion gap: 8 (ref 5–15)
BUN: 14 mg/dL (ref 6–20)
CO2: 28 mmol/L (ref 22–32)
Calcium: 9 mg/dL (ref 8.9–10.3)
Chloride: 102 mmol/L (ref 98–111)
Creatinine, Ser: 0.91 mg/dL (ref 0.61–1.24)
GFR, Estimated: >60 mL/min (ref >60)
Glucose, Bld: 123 mg/dL — ABNORMAL HIGH (ref 70–99)
Potassium: 4.3 mmol/L (ref 3.5–5.1)
Sodium: 138 mmol/L (ref 135–145)

## 2019-11-06 LAB — CBC
HCT: 41.3 % (ref 39.0–52.0)
Hemoglobin: 13.5 g/dL (ref 13.0–17.0)
MCH: 28 pg (ref 26.0–34.0)
MCHC: 32.7 g/dL (ref 30.0–36.0)
MCV: 85.5 fL (ref 80.0–100.0)
Platelets: 227 10*3/uL (ref 150–400)
RBC: 4.83 MIL/uL (ref 4.22–5.81)
RDW: 12.9 % (ref 11.5–15.5)
WBC: 7.2 10*3/uL (ref 4.0–10.5)
nRBC: 0 % (ref 0.0–0.2)

## 2019-11-06 LAB — GLUCOSE, CAPILLARY
Glucose-Capillary: 174 mg/dL — ABNORMAL HIGH (ref 70–99)
Glucose-Capillary: 117 mg/dL — ABNORMAL HIGH (ref 70–99)
Glucose-Capillary: 110 mg/dL — ABNORMAL HIGH (ref 70–99)
Glucose-Capillary: 118 mg/dL — ABNORMAL HIGH (ref 70–99)
Glucose-Capillary: 115 mg/dL — ABNORMAL HIGH (ref 70–99)

## 2019-11-06 LAB — CULTURE, BLOOD (ROUTINE X 2)
Culture: NO GROWTH
Culture: NO GROWTH
Special Requests: ADEQUATE

## 2019-11-06 LAB — AEROBIC/ANAEROBIC CULTURE W GRAM STAIN (SURGICAL/DEEP WOUND): Gram Stain: NONE SEEN

## 2019-11-06 SURGERY — IRRIGATION AND DEBRIDEMENT FOOT
Anesthesia: General | Site: Foot | Laterality: Left

## 2019-11-06 MED ORDER — VANCOMYCIN HCL 1000 MG IV SOLR
INTRAVENOUS | Status: AC
  Filled 2019-11-06: qty 1000

## 2019-11-06 MED ORDER — FENTANYL CITRATE (PF) 100 MCG/2ML IJ SOLN
INTRAMUSCULAR | Status: AC
  Filled 2019-11-06: qty 2

## 2019-11-06 MED ORDER — NEOMYCIN-POLYMYXIN B GU 40-200000 IR SOLN
Status: AC
  Filled 2019-11-06: qty 1

## 2019-11-06 MED ORDER — GLYCOPYRROLATE 0.2 MG/ML IJ SOLN
INTRAMUSCULAR | Status: DC | PRN
  Administered 2019-11-06: .2 mg via INTRAVENOUS

## 2019-11-06 MED ORDER — CLOPIDOGREL BISULFATE 75 MG PO TABS
75.0000 mg | ORAL_TABLET | Freq: Every day | ORAL | Status: DC
  Administered 2019-11-07 – 2019-11-09 (×3): 75 mg via ORAL
  Filled 2019-11-06 (×3): qty 1

## 2019-11-06 MED ORDER — CLOPIDOGREL BISULFATE 75 MG PO TABS
300.0000 mg | ORAL_TABLET | ORAL | Status: AC
  Administered 2019-11-06: 300 mg via ORAL
  Filled 2019-11-06: qty 4

## 2019-11-06 MED ORDER — ONDANSETRON HCL 4 MG/2ML IJ SOLN
INTRAMUSCULAR | Status: DC | PRN
  Administered 2019-11-06: 4 mg via INTRAVENOUS

## 2019-11-06 MED ORDER — BUPIVACAINE HCL (PF) 0.5 % IJ SOLN
INTRAMUSCULAR | Status: DC | PRN
  Administered 2019-11-06: 10 mL

## 2019-11-06 MED ORDER — PHENYLEPHRINE HCL (PRESSORS) 10 MG/ML IV SOLN
INTRAVENOUS | Status: DC | PRN
  Administered 2019-11-06 (×3): 100 ug via INTRAVENOUS

## 2019-11-06 MED ORDER — PROMETHAZINE HCL 25 MG/ML IJ SOLN: 6.2500 mg | INTRAMUSCULAR | Status: DC | PRN

## 2019-11-06 MED ORDER — MIDAZOLAM HCL 2 MG/2ML IJ SOLN
INTRAMUSCULAR | Status: DC | PRN
  Administered 2019-11-06: 2 mg via INTRAVENOUS

## 2019-11-06 MED ORDER — FENTANYL CITRATE (PF) 100 MCG/2ML IJ SOLN: 25.0000 ug | INTRAMUSCULAR | Status: DC | PRN

## 2019-11-06 MED ORDER — PROPOFOL 500 MG/50ML IV EMUL
INTRAVENOUS | Status: DC | PRN
  Administered 2019-11-06: 50 ug/kg/min via INTRAVENOUS

## 2019-11-06 MED ORDER — LIDOCAINE HCL (CARDIAC) PF 100 MG/5ML IV SOSY
PREFILLED_SYRINGE | INTRAVENOUS | Status: DC | PRN
  Administered 2019-11-06: 100 mg via INTRAVENOUS

## 2019-11-06 MED ORDER — PROPOFOL 500 MG/50ML IV EMUL
INTRAVENOUS | Status: AC
  Filled 2019-11-06: qty 50

## 2019-11-06 MED ORDER — PROPOFOL 10 MG/ML IV BOLUS
INTRAVENOUS | Status: DC | PRN
  Administered 2019-11-06: 170 mg via INTRAVENOUS
  Administered 2019-11-06: 40 mg via INTRAVENOUS

## 2019-11-06 MED ORDER — BUPIVACAINE HCL (PF) 0.5 % IJ SOLN
INTRAMUSCULAR | Status: AC
  Filled 2019-11-06: qty 30

## 2019-11-06 MED ORDER — MIDAZOLAM HCL 2 MG/2ML IJ SOLN
INTRAMUSCULAR | Status: AC
  Filled 2019-11-06: qty 2

## 2019-11-06 MED ORDER — GENTAMICIN SULFATE 40 MG/ML IJ SOLN
INTRAMUSCULAR | Status: AC
  Filled 2019-11-06: qty 6

## 2019-11-06 MED ORDER — SODIUM CHLORIDE 0.9 % IV SOLN: INTRAVENOUS | Status: DC | PRN

## 2019-11-06 MED ORDER — FENTANYL CITRATE (PF) 100 MCG/2ML IJ SOLN
INTRAMUSCULAR | Status: DC | PRN
  Administered 2019-11-06: 50 ug via INTRAVENOUS

## 2019-11-06 SURGICAL SUPPLY — 57 items
"PENCIL ELECTRO HAND CTR " (MISCELLANEOUS) ×1 IMPLANT
BLADE OSCILLATING/SAGITTAL (BLADE)
BLADE SURG 15 STRL LF DISP TIS (BLADE) ×1 IMPLANT
BLADE SURG 15 STRL SS (BLADE) ×3
BLADE SURG MINI STRL (BLADE) IMPLANT
BLADE SW THK.38XMED LNG THN (BLADE) IMPLANT
BNDG CMPR STD VLCR NS LF 5.8X4 (GAUZE/BANDAGES/DRESSINGS) ×1
BNDG CONFORM 2 STRL LF (GAUZE/BANDAGES/DRESSINGS) ×3 IMPLANT
BNDG ELASTIC 4X5.8 VLCR NS LF (GAUZE/BANDAGES/DRESSINGS) ×3 IMPLANT
BNDG ESMARK 4X12 TAN STRL LF (GAUZE/BANDAGES/DRESSINGS) ×3 IMPLANT
BNDG GAUZE 4.5X4.1 6PLY STRL (MISCELLANEOUS) ×3 IMPLANT
CANISTER SUCT 1200ML W/VALVE (MISCELLANEOUS) ×3 IMPLANT
COVER WAND RF STERILE (DRAPES) ×3 IMPLANT
CUFF TOURN SGL QUICK 12 (TOURNIQUET CUFF) IMPLANT
CUFF TOURN SGL QUICK 18X4 (TOURNIQUET CUFF) IMPLANT
DRAPE FLUOR MINI C-ARM 54X84 (DRAPES) IMPLANT
DRSG MEPITEL 4X7.2 (GAUZE/BANDAGES/DRESSINGS) ×2 IMPLANT
DURAPREP 26ML APPLICATOR (WOUND CARE) ×3 IMPLANT
ELECT REM PT RETURN 9FT ADLT (ELECTROSURGICAL) ×3
ELECTRODE REM PT RTRN 9FT ADLT (ELECTROSURGICAL) ×1 IMPLANT
GAUZE 4X4 16PLY RFD (DISPOSABLE) ×2 IMPLANT
GAUZE SPONGE 4X4 12PLY STRL (GAUZE/BANDAGES/DRESSINGS) ×5 IMPLANT
GAUZE XEROFORM 1X8 LF (GAUZE/BANDAGES/DRESSINGS) ×3 IMPLANT
GLOVE BIO SURGEON STRL SZ7.5 (GLOVE) ×3 IMPLANT
GLOVE INDICATOR 8.0 STRL GRN (GLOVE) ×3 IMPLANT
GOWN STRL REUS W/ TWL LRG LVL3 (GOWN DISPOSABLE) ×2 IMPLANT
GOWN STRL REUS W/TWL LRG LVL3 (GOWN DISPOSABLE) ×6
HANDPIECE VERSAJET DEBRIDEMENT (MISCELLANEOUS) ×5 IMPLANT
KIT STIMULAN RAPID CURE 5CC (Orthopedic Implant) ×2 IMPLANT
KIT TURNOVER KIT A (KITS) ×3 IMPLANT
LABEL OR SOLS (LABEL) ×3 IMPLANT
NDL FILTER BLUNT 18X1 1/2 (NEEDLE) ×1 IMPLANT
NDL HYPO 25X1 1.5 SAFETY (NEEDLE) ×3 IMPLANT
NDL SAFETY ECLIPSE 18X1.5 (NEEDLE) IMPLANT
NEEDLE FILTER BLUNT 18X 1/2SAF (NEEDLE) ×2
NEEDLE FILTER BLUNT 18X1 1/2 (NEEDLE) ×1 IMPLANT
NEEDLE HYPO 18GX1.5 SHARP (NEEDLE) ×3
NEEDLE HYPO 25X1 1.5 SAFETY (NEEDLE) ×9 IMPLANT
NS IRRIG 500ML POUR BTL (IV SOLUTION) ×3 IMPLANT
PACK EXTREMITY (MISCELLANEOUS) ×3 IMPLANT
PAD ABD DERMACEA PRESS 5X9 (GAUZE/BANDAGES/DRESSINGS) ×6 IMPLANT
PENCIL ELECTRO HAND CTR (MISCELLANEOUS) ×3 IMPLANT
RASP SM TEAR CROSS CUT (RASP) IMPLANT
SOL PREP PVP 2OZ (MISCELLANEOUS) ×3
SOLUTION PREP PVP 2OZ (MISCELLANEOUS) ×1 IMPLANT
STOCKINETTE STRL 6IN 960660 (GAUZE/BANDAGES/DRESSINGS) ×3 IMPLANT
SUT ETHILON 3-0 FS-10 30 BLK (SUTURE) ×3
SUT ETHILON 4-0 (SUTURE) ×3
SUT ETHILON 4-0 FS2 18XMFL BLK (SUTURE) ×1
SUT VIC AB 3-0 SH 27 (SUTURE)
SUT VIC AB 3-0 SH 27X BRD (SUTURE) ×1 IMPLANT
SUT VIC AB 4-0 FS2 27 (SUTURE) ×1 IMPLANT
SUTURE EHLN 3-0 FS-10 30 BLK (SUTURE) ×1 IMPLANT
SUTURE ETHLN 4-0 FS2 18XMF BLK (SUTURE) ×1 IMPLANT
SYR 10ML LL (SYRINGE) ×8 IMPLANT
SYR 3ML LL SCALE MARK (SYRINGE) ×3 IMPLANT
SYR BULB IRRIG 60ML STRL (SYRINGE) ×2 IMPLANT

## 2019-11-06 NOTE — Anesthesia Procedure Notes (Signed)
Procedure Name: LMA Insertion Date/Time: 11/06/2019 5:47 PM Performed by: Junious Silk, CRNA Pre-anesthesia Checklist: Patient identified, Patient being monitored, Timeout performed, Emergency Drugs available and Suction available Patient Re-evaluated:Patient Re-evaluated prior to induction Oxygen Delivery Method: Circle system utilized Preoxygenation: Pre-oxygenation with 100% oxygen Induction Type: IV induction Ventilation: Mask ventilation without difficulty LMA: LMA inserted LMA Size: 4.5 Tube type: Oral Number of attempts: 1 Placement Confirmation: positive ETCO2 and breath sounds checked- equal and bilateral Tube secured with: Tape Dental Injury: Teeth and Oropharynx as per pre-operative assessment

## 2019-11-06 NOTE — Progress Notes (Signed)
[facility] Daily Progress Note  Subjective: 11/05/19 1. Introduction catheter into left lower extremity 3rd order catheter placement  2. Contrast injection left lower extremity for distal runoff  3. Percutaneous transluminal angioplasty left superficial femoral 4. Percutaneous transluminal angioplasty to 3 mm left anterior tibial and additional angioplasty of the left posterior tibial to 3 mm  5. Star close closure right common femoral arteriotomy  Patient without complaint this AM.  No issues overnight.  Objective: Vitals:   11/06/19 0024 11/06/19 0025 11/06/19 0346 11/06/19 0740  BP: 117/68 117/68 106/74 118/63  Pulse: 61 61 66 69  Resp: 18 18 18 18   Temp: 98.2 F (36.8 C) 98.2 F (36.8 C) 98.4 F (36.9 C) 98.6 F (37 C)  TempSrc: Oral Oral Oral   SpO2: 99% 99% 97% 99%  Weight:      Height:        Intake/Output Summary (Last 24 hours) at 11/06/2019 11/08/2019 Last data filed at 11/06/2019 0820 Gross per 24 hour  Intake 120 ml  Output 1895 ml  Net -1775 ml   Physical Exam: A&Ox3, NAD CV: RRR Pulmonary: CTA Bilaterally Abdomen: Soft, Nontender, Nondistended Right groin:  Access site: Clean dry intact.  No swelling or drainage. Vascular:  Left lower extremity: Thigh soft.  Calf soft.  Podiatry dressing intact clean and dry.  Extremity is warm.   Laboratory: CBC    Component Value Date/Time   WBC 7.2 11/06/2019 0419   HGB 13.5 11/06/2019 0419   HCT 41.3 11/06/2019 0419   PLT 227 11/06/2019 0419   BMET    Component Value Date/Time   NA 138 11/06/2019 0419   NA 140 02/13/2019 0953   K 4.3 11/06/2019 0419   CL 102 11/06/2019 0419   CO2 28 11/06/2019 0419   GLUCOSE 123 (H) 11/06/2019 0419   BUN 14 11/06/2019 0419   BUN 15 02/13/2019 0953   CREATININE 0.91 11/06/2019 0419   CALCIUM 9.0 11/06/2019 0419   GFRNONAA >60 11/06/2019 0419   GFRAA >60 09/03/2019 0925    Assessment/Planning: The patient is a 59 year old male with atherosclerotic disease to the left lower extremity status post endovascular intervention POD#1  1) successful revascularization of the left lower extremity 2) on aspirin and statin for medical management.  Would start Plavix when cleared by podiatry  3) for debridement with podiatry today 4) we will continue to follow the patient in the outpatient setting.  Discussed with Dr. 41 Nacole Fluhr PA-C 11/06/2019 9:58 AM

## 2019-11-06 NOTE — Plan of Care (Signed)
Pt Axox4. Calm and cooperative and able to voice his needs. Prn pain med adm for L lower extremity/foot pain. Dressing remains CDI. Pt on IVF and on IV Abx. Safety measures in place. Will continue to monitor. Problem: Education: Goal: Knowledge of General Education information will improve Description: Including pain rating scale, medication(s)/side effects and non-pharmacologic comfort measures Outcome: Progressing   Problem: Health Behavior/Discharge Planning: Goal: Ability to manage health-related needs will improve Outcome: Progressing   Problem: Clinical Measurements: Goal: Ability to maintain clinical measurements within normal limits will improve Outcome: Progressing Goal: Will remain free from infection Outcome: Progressing Goal: Diagnostic test results will improve Outcome: Progressing Goal: Respiratory complications will improve Outcome: Progressing Goal: Cardiovascular complication will be avoided Outcome: Progressing   Problem: Activity: Goal: Risk for activity intolerance will decrease Outcome: Progressing   Problem: Nutrition: Goal: Adequate nutrition will be maintained Outcome: Progressing   Problem: Coping: Goal: Level of anxiety will decrease Outcome: Progressing   Problem: Elimination: Goal: Will not experience complications related to bowel motility Outcome: Progressing Goal: Will not experience complications related to urinary retention Outcome: Progressing   Problem: Pain Managment: Goal: General experience of comfort will improve Outcome: Progressing   Problem: Safety: Goal: Ability to remain free from injury will improve Outcome: Progressing   Problem: Skin Integrity: Goal: Risk for impaired skin integrity will decrease Outcome: Progressing

## 2019-11-06 NOTE — Anesthesia Postprocedure Evaluation (Signed)
Anesthesia Post Note  Patient: Reginald Nielsen  Procedure(s) Performed: IRRIGATION AND DEBRIDEMENT FOOT (Left Foot)  Patient location during evaluation: PACU Anesthesia Type: General Level of consciousness: awake and alert Pain management: pain level controlled Vital Signs Assessment: post-procedure vital signs reviewed and stable Respiratory status: spontaneous breathing, nonlabored ventilation, respiratory function stable and patient connected to nasal cannula oxygen Cardiovascular status: blood pressure returned to baseline and stable Postop Assessment: no apparent nausea or vomiting Anesthetic complications: no   No complications documented.   Last Vitals:  Vitals:   11/06/19 1906 11/06/19 1921  BP: 130/67 132/72  Pulse: (!) 47 (!) 51  Resp: (!) 24 (!) 25  Temp:  (!) 36.4 C  SpO2: 100% 99%    Last Pain:  Vitals:   11/06/19 1921  TempSrc:   PainSc: 0-No pain                 Lenard Simmer

## 2019-11-06 NOTE — Transfer of Care (Signed)
Immediate Anesthesia Transfer of Care Note  Patient: Reginald Nielsen  Procedure(s) Performed: IRRIGATION AND DEBRIDEMENT FOOT (Left Foot)  Patient Location: PACU  Anesthesia Type:General  Level of Consciousness: awake, alert  and oriented  Airway & Oxygen Therapy: Patient Spontanous Breathing and Patient connected to face mask oxygen  Post-op Assessment: Report given to RN and Post -op Vital signs reviewed and stable  Post vital signs: Reviewed and stable  Last Vitals:  Vitals Value Taken Time  BP    Temp    Pulse 55 11/06/19 1820  Resp    SpO2 98 % 11/06/19 1820  Vitals shown include unvalidated device data.  Last Pain:  Vitals:   11/06/19 1400  TempSrc:   PainSc: Asleep         Complications: No complications documented.

## 2019-11-06 NOTE — Op Note (Signed)
Date of operation: 11/06/2019.  Surgeon: [name] D.P.M.  Preoperative diagnosis: Progressive necrotic changes left foot status post I&D.  Postoperative diagnosis: Same.  Procedure: I&D left foot.  Anesthesia: LMA.  Hemostasis: None.  Estimated blood loss: 15 cc.  Implants: Stimulan rapid cure antibiotic beads impregnated with vancomycin and gentamicin.  Injectables: 10 cc 0.5% Marcaine plain.  Complications: None apparent.  Operative indications: This is a 59 year old male with recent hospitalization for abscess and osteomyelitis left forefoot.  Had an I&D with removal of his tibial sesamoid this weekend.  Revascularization procedure performed yesterday.  Progressive necrotic changes were noted around the original incision site and decision was made for repeat I&D to remove all gangrenous and devitalized tissue.  Operative procedure: Patient was taken to the operating room and placed on the table in the supine position.  Following satisfactory LMA anesthesia the left foot was prepped and draped in the usual sterile fashion.  Attention was then directed to the plantar aspect of the left foot where necrotic tissue was noted around the plantar incision from his previous surgery.  Using a 15 blade sharp excision was performed at the border of the viable and devitalized skin.  There was noted to be some continued underlying necrosis around the skin borders.  This was grossly debrided with a rongeur down to the level of the deep fascia and joint followed by debridement of all exposed soft tissue areas using a versa jet debrider on a setting of 2 and then irrigated with the versa jet on a setting of 2.  Stimulan rapid cure antibiotic beads were then placed into the proximal and distal portions of the wound and 3-0 nylon figure-of-eight sutures applied proximally for partial closure proximally with a simple interrupted suture distally.  The remaining defect was then packed with the antibiotic  beads and covered using Mepitel and held in place with staples.  10 cc of 0.5% Marcaine plain was then injected for postoperative analgesia.  4 x 4's ABDs Kerlix and an Ace applied to the left lower extremity for compression.  Patient was awakened and transported to the PACU with vital signs stable and in good condition.

## 2019-11-06 NOTE — Interval H&P Note (Signed)
History and Physical Interval Note:  11/06/2019 5:02 PM  Reginald Nielsen  has presented today for surgery, with the diagnosis of Wound Left Foot.  The various methods of treatment have been discussed with the patient and family. After consideration of risks, benefits and other options for treatment, the patient has consented to  Procedure(s): IRRIGATION AND DEBRIDEMENT FOOT (Left) as a surgical intervention.  The patient's history has been reviewed, patient examined, no change in status, stable for surgery.  I have reviewed the patient's chart and labs.  Questions were answered to the patient's satisfaction.     [name]

## 2019-11-06 NOTE — Anesthesia Preprocedure Evaluation (Signed)
Anesthesia Evaluation  Patient identified by MRN, date of birth, ID band Patient awake    Reviewed: Allergy & Precautions, NPO status , Patient's Chart, lab work & pertinent test results  History of Anesthesia Complications Negative for: history of anesthetic complications  Airway Mallampati: II  TM Distance: >3 FB Neck ROM: Full    Dental  (+) Dental Advidsory Given, Teeth Intact,    Pulmonary neg pulmonary ROS,    breath sounds clear to auscultation       Cardiovascular hypertension, (-) angina+ Peripheral Vascular Disease  (-) DOE  Rhythm:Regular Rate:Normal   HLD   Neuro/Psych negative psych ROS   GI/Hepatic negative GI ROS, Neg liver ROS, neg GERD  ,  Endo/Other  diabetes  Renal/GU negative Renal ROS  negative genitourinary   Musculoskeletal  (+) Arthritis , Osteoarthritis,    Abdominal   Peds negative pediatric ROS (+)  Hematology negative hematology ROS (+)   Anesthesia Other Findings Past Medical History: No date: Diabetes mellitus without complication (HCC)     Comment:  type 2 No date: Glaucoma No date: Hyperlipidemia No date: Hypertension No date: Shoulder pain, left  Reproductive/Obstetrics                             Anesthesia Physical  Anesthesia Plan  ASA: II  Anesthesia Plan: General   Post-op Pain Management:    Induction: Intravenous  PONV Risk Score and Plan: 2 and Ondansetron, Dexamethasone, Midazolam and Promethazine  Airway Management Planned: LMA  Additional Equipment:   Intra-op Plan:   Post-operative Plan: Extubation in OR  Informed Consent: I have reviewed the patients History and Physical, chart, labs and discussed the procedure including the risks, benefits and alternatives for the proposed anesthesia with the patient or authorized representative who has indicated his/her understanding and acceptance.       Plan Discussed with: CRNA  and Anesthesiologist  Anesthesia Plan Comments:         Anesthesia Quick Evaluation

## 2019-11-06 NOTE — Progress Notes (Signed)
Patient ID: Reginald Nielsen, male   DOB: 1960-11-21, 59 y.o.   MRN: [id number] [facility] PROGRESS NOTE  Reginald Nielsen QQV:[id number] DOB: 04/06/1960 DOA: 11/01/2019 PCP: Reginald Limerick, MD  HPI/Subjective: Patient seen this morning.  He was feeling okay.  Does have some stinging and some pain in his left foot.  No history of smoking.  Had revascularization procedure yesterday of left foot.  Objective: Vitals:   11/06/19 1228 11/06/19 1607  BP: 113/73 118/72  Pulse: (!) 58 (!) 51  Resp: 16 16  Temp: 97.9 F (36.6 C) 98.1 F (36.7 C)  SpO2: 100% 100%    Intake/Output Summary (Last 24 hours) at 11/06/2019 1722 Last data filed at 11/06/2019 0820 Gross per 24 hour  Intake 120 ml  Output 1470 ml  Net -1350 ml   Filed Weights   11/01/19 1139 11/05/19 1016  Weight: 99.8 kg 99.8 kg    ROS: Review of Systems  Respiratory: Negative for cough and shortness of breath.   Cardiovascular: Negative for chest pain.  Gastrointestinal: Negative for nausea and vomiting.  Musculoskeletal: Positive for joint pain.   Exam: Physical Exam HENT:     Head: Normocephalic.     Mouth/Throat:     Pharynx: No oropharyngeal exudate.  Eyes:     General: Lids are normal.     Conjunctiva/sclera: Conjunctivae normal.     Pupils: Pupils are equal, round, and reactive to light.  Cardiovascular:     Rate and Rhythm: Normal rate and regular rhythm.     Heart sounds: Normal heart sounds, S1 normal and S2 normal.  Pulmonary:     Breath sounds: No decreased breath sounds, wheezing, rhonchi or rales.  Abdominal:     Palpations: Abdomen is soft.     Tenderness: There is no abdominal tenderness.  Musculoskeletal:     Right lower leg: No swelling.     Left lower leg: No swelling.  Skin:    General: Skin is warm.     Comments: Left foot covered with bandage.  Neurological:     Mental Status: He is alert and oriented to person, place, and time.       Data Reviewed: Basic Metabolic  Panel: Recent Labs  Lab 11/02/19 0640 11/03/19 0840 11/04/19 0425 11/05/19 0638 11/06/19 0419  NA 135 136 140 138 138  K 4.1 4.4 4.3 4.1 4.3  CL 101 99 104 102 102  CO2 24 28 27 27 28   GLUCOSE 126* 162* 144* 133* 123*  BUN 16 20 19 18 14   CREATININE 1.14 0.82 0.86 0.85 0.91  CALCIUM 8.9 9.1 9.1 9.0 9.0   Liver Function Tests: Recent Labs  Lab 11/01/19 1142  AST 13*  ALT 14  ALKPHOS 40  BILITOT 0.9  PROT 8.1  ALBUMIN 3.8   CBC: Recent Labs  Lab 11/01/19 1142 11/01/19 1142 11/02/19 0640 11/03/19 0840 11/04/19 0425 11/05/19 0638 11/06/19 0419  WBC 10.4   < > 8.1 9.9 7.3 7.0 7.2  NEUTROABS 7.7  --   --   --   --   --   --   HGB 14.7   < > 13.7 13.9 13.9 14.8 13.5  HCT 44.7   < > 41.1 42.3 42.3 45.2 41.3  MCV 84.8   < > 84.4 84.1 84.8 85.8 85.5  PLT 206   < > 186 224 228 261 227   < > = values in this interval not displayed.    CBG: Recent Labs  Lab  11/05/19 1600 11/05/19 2115 11/06/19 0747 11/06/19 1224 11/06/19 1604  GLUCAP 118* 174* 117* 110* 118*    Recent Results (from the past 240 hour(s))  Respiratory Panel by RT PCR (Flu A&B, Covid) - Nasopharyngeal Swab     Status: None   Collection Time: 11/01/19  2:09 PM   Specimen: Nasopharyngeal Swab  Result Value Ref Range Status   SARS Coronavirus 2 by RT PCR NEGATIVE NEGATIVE Final    Comment: (NOTE) SARS-CoV-2 target nucleic acids are NOT DETECTED.  The SARS-CoV-2 RNA is generally detectable in upper respiratoy specimens during the acute phase of infection. The lowest concentration of SARS-CoV-2 viral copies this assay can detect is 131 copies/mL. A negative result does not preclude SARS-Cov-2 infection and should not be used as the sole basis for treatment or other patient management decisions. A negative result may occur with  improper specimen collection/handling, submission of specimen other than nasopharyngeal swab, presence of viral mutation(s) within the areas targeted by this assay, and  inadequate number of viral copies (<131 copies/mL). A negative result must be combined with clinical observations, patient history, and epidemiological information. The expected result is Negative.  Fact Sheet for Patients:  https://www.moore.com/  Fact Sheet for Healthcare Providers:  https://www.young.biz/  This test is no t yet approved or cleared by the [place] FDA and  has been authorized for detection and/or diagnosis of SARS-CoV-2 by FDA under an Emergency Use Authorization (EUA). This EUA will remain  in effect (meaning this test can be used) for the duration of the COVID-19 declaration under Section 564(b)(1) of the Act, 21 U.S.C. section 360bbb-3(b)(1), unless the authorization is terminated or revoked sooner.     Influenza A by PCR NEGATIVE NEGATIVE Final   Influenza B by PCR NEGATIVE NEGATIVE Final    Comment: (NOTE) The Xpert Xpress SARS-CoV-2/FLU/RSV assay is intended as an aid in  the diagnosis of influenza from Nasopharyngeal swab specimens and  should not be used as a sole basis for treatment. Nasal washings and  aspirates are unacceptable for Xpert Xpress SARS-CoV-2/FLU/RSV  testing.  Fact Sheet for Patients: https://www.moore.com/  Fact Sheet for Healthcare Providers: https://www.young.biz/  This test is not yet approved or cleared by the [place] FDA and  has been authorized for detection and/or diagnosis of SARS-CoV-2 by  FDA under an Emergency Use Authorization (EUA). This EUA will remain  in effect (meaning this test can be used) for the duration of the  Covid-19 declaration under Section 564(b)(1) of the Act, 21  U.S.C. section 360bbb-3(b)(1), unless the authorization is  terminated or revoked. Performed at [facility], [address] [place], [place] [address]   CULTURE, BLOOD (ROUTINE X 2) w Reflex to ID Panel     Status: None   Collection Time:  11/01/19  2:10 PM   Specimen: BLOOD RIGHT ARM  Result Value Ref Range Status   Specimen Description BLOOD RIGHT ARM  Final   Special Requests   Final    BOTTLES DRAWN AEROBIC AND ANAEROBIC Blood Culture results may not be optimal due to an inadequate volume of blood received in culture bottles   Culture   Final    NO GROWTH 5 DAYS Performed at [facility], [address] [place], [place] [address]    Report Status 11/06/2019 FINAL  Final  CULTURE, BLOOD (ROUTINE X 2) w Reflex to ID Panel     Status: None   Collection Time: 11/01/19  2:10 PM   Specimen: BLOOD RIGHT ARM  Result Value Ref Range Status   Specimen Description BLOOD RIGHT ARM  Final   Special Requests   Final    BOTTLES DRAWN AEROBIC AND ANAEROBIC Blood Culture adequate volume   Culture   Final    NO GROWTH 5 DAYS Performed at [facility], [address], [place], [place] [address]    Report Status 11/06/2019 FINAL  Final  Surgical PCR screen     Status: None   Collection Time: 11/01/19  8:04 PM   Specimen: Nasal Mucosa; Nasal Swab  Result Value Ref Range Status   MRSA, PCR NEGATIVE NEGATIVE Final   Staphylococcus aureus NEGATIVE NEGATIVE Final    Comment: (NOTE) The Xpert SA Assay (FDA approved for NASAL specimens in patients 31 years of age and older), is one component of a comprehensive surveillance program. It is not intended to diagnose infection nor to guide or monitor treatment. Performed at [facility], [address] [place], [place] [address]   Aerobic/Anaerobic Culture (surgical/deep wound)     Status: None   Collection Time: 11/02/19 12:15 PM   Specimen: Foot, Left; Abscess  Result Value Ref Range Status   Specimen Description   Final    FOOT LEFT Performed at [facility], [address], [place], [place] [address]    Special Requests   Final    NONE Performed at [facility], [address] [place], [place] [address]    Gram Stain NO WBC  SEEN NO ORGANISMS SEEN   Final   Culture   Final    RARE STAPHYLOCOCCUS AUREUS RARE BACTEROIDES FRAGILIS BETA LACTAMASE POSITIVE Performed at [facility], [address] [place], [place] [address]    Report Status 11/06/2019 FINAL  Final   Organism ID, Bacteria STAPHYLOCOCCUS AUREUS  Final      Susceptibility   Staphylococcus aureus - MIC*    CIPROFLOXACIN <=0.5 SENSITIVE Sensitive     ERYTHROMYCIN <=0.25 SENSITIVE Sensitive     GENTAMICIN <=0.5 SENSITIVE Sensitive     OXACILLIN 0.5 SENSITIVE Sensitive     TETRACYCLINE <=1 SENSITIVE Sensitive     VANCOMYCIN <=0.5 SENSITIVE Sensitive     TRIMETH/SULFA <=10 SENSITIVE Sensitive     CLINDAMYCIN <=0.25 SENSITIVE Sensitive     RIFAMPIN <=0.5 SENSITIVE Sensitive     Inducible Clindamycin NEGATIVE Sensitive     * RARE STAPHYLOCOCCUS AUREUS  Anaerobic culture     Status: None   Collection Time: 11/02/19 12:16 PM   Specimen: Foot, Left; Tissue  Result Value Ref Range Status   Specimen Description   Final    FOOT LEFT Performed at [facility], [address], [place], [place] [address]    Special Requests   Final    NONE Performed at [facility], [address] [place], [place] [address]    Gram Stain   Final    RARE WBC PRESENT, PREDOMINANTLY PMN ABUNDANT GRAM POSITIVE COCCI MODERATE GRAM NEGATIVE RODS Performed at [facility], [address], [place], [place] [address]    Culture   Final    MIXED ANAEROBIC FLORA PRESENT.  CALL LAB IF FURTHER IID REQUIRED.   Report Status 11/04/2019 FINAL  Final     Studies: PERIPHERAL VASCULAR CATHETERIZATION  Result Date: 11/05/2019 See Op Note   Scheduled Meds: . [MAR Hold] aspirin EC  81 mg Oral Daily  . [MAR Hold] atorvastatin  10 mg Oral Daily  . [MAR Hold] brimonidine  1 drop Both Eyes  BID   And  . [MAR Hold] timolol  1 drop Both Eyes BID  . [MAR Hold] gabapentin  100 mg Oral TID  . influenza vac split quadrivalent PF  0.5 mL  Intramuscular Tomorrow-1000  . [MAR Hold] insulin aspart  0-5 Units Subcutaneous QHS  . [MAR Hold] insulin aspart  0-9 Units Subcutaneous TID WC  . [MAR Hold] lactulose  20 g Oral Daily  . [MAR Hold] latanoprost  1 drop Both Eyes QHS  . pneumococcal 23 valent vaccine  0.5 mL Intramuscular Tomorrow-1000   Continuous Infusions: . sodium chloride 75 mL/hr at 11/06/19 0745  . [MAR Hold] ampicillin-sulbactam (UNASYN) IV 3 g (11/06/19 1300)    Assessment/Plan:  1. Abscess on the foot with some necrotic areas.  Osteomyelitis of the sesamoid bone.  Podiatry taken to the OR today.  Continue Unasyn. 2. 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  Patient had revascular procedure done yesterday.  We will add Plavix once we know podiatry is not can to do another procedure.  Currently on aspirin.  On gabapentin.  We will add on an A1c.  Last A1c was actually very low.  Currently on sliding scale insulin. 3. Hyperlipidemia unspecified on Lipitor     Code Status:     Code Status Orders  (From admission, onward)         Start     Ordered   11/01/19 1330  Full code  Continuous        11/01/19 1330        Code Status History    Date Active Date Inactive Code Status Order ID Comments User Context   09/03/2019 1152 09/03/2019 1858 Full Code [id number]  Schnier, Latina CraverGregory G, MD Inpatient   Advance Care Planning Activity     Family Communication: As per podiatry today Disposition Plan: Status is: Inpatient   Dispo: The patient is from: Home              Anticipated d/c is to: Home              Anticipated d/c date is: May be 11/08/2019              Patient currently being treated with IV antibiotics.  Consultants:  Podiatry  Vascular surgery  Procedures:  Left lower extremity angiogram  Today a debridement to be done.  Antibiotics:  Unasyn  Time spent: 28 minutes  Elinora Weigand [employer]  [facility]

## 2019-11-07 ENCOUNTER — Inpatient Hospital Stay: Payer: Self-pay

## 2019-11-07 ENCOUNTER — Encounter: Payer: Self-pay | Admitting: Podiatry

## 2019-11-07 DIAGNOSIS — L089 Local infection of the skin and subcutaneous tissue, unspecified: Secondary | ICD-10-CM | POA: Diagnosis not present

## 2019-11-07 DIAGNOSIS — L02612 Cutaneous abscess of left foot: Secondary | ICD-10-CM | POA: Diagnosis not present

## 2019-11-07 DIAGNOSIS — L97423 Non-pressure chronic ulcer of left heel and midfoot with necrosis of muscle: Secondary | ICD-10-CM

## 2019-11-07 DIAGNOSIS — I739 Peripheral vascular disease, unspecified: Secondary | ICD-10-CM | POA: Diagnosis not present

## 2019-11-07 DIAGNOSIS — E1151 Type 2 diabetes mellitus with diabetic peripheral angiopathy without gangrene: Secondary | ICD-10-CM | POA: Diagnosis not present

## 2019-11-07 DIAGNOSIS — E11621 Type 2 diabetes mellitus with foot ulcer: Secondary | ICD-10-CM | POA: Diagnosis not present

## 2019-11-07 DIAGNOSIS — M86172 Other acute osteomyelitis, left ankle and foot: Secondary | ICD-10-CM | POA: Diagnosis not present

## 2019-11-07 LAB — CBC
HCT: 41.6 % (ref 39.0–52.0)
Hemoglobin: 13.6 g/dL (ref 13.0–17.0)
MCH: 27.9 pg (ref 26.0–34.0)
MCHC: 32.7 g/dL (ref 30.0–36.0)
MCV: 85.4 fL (ref 80.0–100.0)
Platelets: 210 10*3/uL (ref 150–400)
RBC: 4.87 MIL/uL (ref 4.22–5.81)
RDW: 12.9 % (ref 11.5–15.5)
WBC: 7 10*3/uL (ref 4.0–10.5)
nRBC: 0 % (ref 0.0–0.2)

## 2019-11-07 LAB — BASIC METABOLIC PANEL
Anion gap: 7 (ref 5–15)
BUN: 14 mg/dL (ref 6–20)
CO2: 29 mmol/L (ref 22–32)
Calcium: 9 mg/dL (ref 8.9–10.3)
Chloride: 103 mmol/L (ref 98–111)
Creatinine, Ser: 0.94 mg/dL (ref 0.61–1.24)
GFR, Estimated: >60 mL/min (ref >60)
Glucose, Bld: 143 mg/dL — ABNORMAL HIGH (ref 70–99)
Potassium: 5.2 mmol/L — ABNORMAL HIGH (ref 3.5–5.1)
Sodium: 139 mmol/L (ref 135–145)

## 2019-11-07 LAB — GLUCOSE, CAPILLARY
Glucose-Capillary: 109 mg/dL — ABNORMAL HIGH (ref 70–99)
Glucose-Capillary: 187 mg/dL — ABNORMAL HIGH (ref 70–99)
Glucose-Capillary: 139 mg/dL — ABNORMAL HIGH (ref 70–99)
Glucose-Capillary: 142 mg/dL — ABNORMAL HIGH (ref 70–99)

## 2019-11-07 MED ORDER — METRONIDAZOLE 500 MG PO TABS
500.0000 mg | ORAL_TABLET | Freq: Three times a day (TID) | ORAL | Status: DC
  Administered 2019-11-07 – 2019-11-09 (×5): 500 mg via ORAL
  Filled 2019-11-07 (×8): qty 1

## 2019-11-07 MED ORDER — SODIUM ZIRCONIUM CYCLOSILICATE 10 G PO PACK
10.0000 g | PACK | Freq: Once | ORAL | Status: AC
  Administered 2019-11-07: 10 g via ORAL
  Filled 2019-11-07: qty 1

## 2019-11-07 MED ORDER — CEFAZOLIN SODIUM-DEXTROSE 2-4 GM/100ML-% IV SOLN
2.0000 g | Freq: Three times a day (TID) | INTRAVENOUS | Status: DC
  Administered 2019-11-07 – 2019-11-09 (×5): 2 g via INTRAVENOUS
  Filled 2019-11-07 (×8): qty 100

## 2019-11-07 NOTE — Plan of Care (Signed)
Pt arrived to the floor on RA s/p I&d around 1930. Pt oriented to room, staff done. Initial assessment performed. Prn pain med adm for L foot pain and dressing CDI. Pt able to use urinal to void. Pt has a boot he came from the OR with to use when he ambulates. Safety measures in place Will continue to monitor.  Problem: Education: Goal: Knowledge of General Education information will improve Description: Including pain rating scale, medication(s)/side effects and non-pharmacologic comfort measures Outcome: Progressing   Problem: Health Behavior/Discharge Planning: Goal: Ability to manage health-related needs will improve Outcome: Progressing   Problem: Clinical Measurements: Goal: Ability to maintain clinical measurements within normal limits will improve Outcome: Progressing Goal: Will remain free from infection Outcome: Progressing Goal: Diagnostic test results will improve Outcome: Progressing Goal: Respiratory complications will improve Outcome: Progressing Goal: Cardiovascular complication will be avoided Outcome: Progressing   Problem: Activity: Goal: Risk for activity intolerance will decrease Outcome: Progressing   Problem: Nutrition: Goal: Adequate nutrition will be maintained Outcome: Progressing   Problem: Coping: Goal: Level of anxiety will decrease Outcome: Progressing   Problem: Elimination: Goal: Will not experience complications related to bowel motility Outcome: Progressing Goal: Will not experience complications related to urinary retention Outcome: Progressing   Problem: Pain Managment: Goal: General experience of comfort will improve Outcome: Progressing   Problem: Safety: Goal: Ability to remain free from injury will improve Outcome: Progressing   Problem: Skin Integrity: Goal: Risk for impaired skin integrity will decrease Outcome: Progressing

## 2019-11-07 NOTE — Treatment Plan (Signed)
Diagnosis: Osteomyelitis rt foot with wound Baseline Creatinine <1  Culture Result: MSSA+anerobes  No Known Allergies  OPAT Orders Discharge antibiotics: Cefazolin 2 grams IV every 8 hours until  End Date: 12/14/19 Flagyl 541m tablet three times a day until 11/24/19  [facility]  Labs weekly Monday while on IV antibiotics: _X_ CBC with differential __ BMP _X_ CMP Every other week on a monday _X_ CRP _X_ ESR   X__ Please pull PIC at completion of IV antibiotics  Fax weekly labs to ([phone] Clinic Follow Up Appt:3 weeks   Call [phone] any questions

## 2019-11-07 NOTE — Plan of Care (Signed)

## 2019-11-07 NOTE — Progress Notes (Signed)
Patient ID: Reginald Nielsen, male   DOB: May 03, 1960, 59 y.o.   MRN: [id number] [facility] PROGRESS NOTE  Reginald Nielsen YHC:[id number] DOB: February 12, 1960 DOA: 11/01/2019 PCP: Reginald Limerick, MD  HPI/Subjective: Patient feeling okay.  Has little pain in his foot.  Objective: Vitals:   11/07/19 1203 11/07/19 1557  BP: 120/71 123/72  Pulse: 60 65  Resp: 16 20  Temp: 98.2 F (36.8 C) 98.2 F (36.8 C)  SpO2: 100% 100%    Intake/Output Summary (Last 24 hours) at 11/07/2019 1710 Last data filed at 11/07/2019 1500 Gross per 24 hour  Intake 2374.2 ml  Output 3950 ml  Net -1575.8 ml   Filed Weights   11/01/19 1139 11/05/19 1016  Weight: 99.8 kg 99.8 kg    ROS: Review of Systems  Respiratory: Negative for cough and shortness of breath.   Cardiovascular: Negative for chest pain.  Gastrointestinal: Negative for abdominal pain, nausea and vomiting.  Musculoskeletal: Positive for joint pain.   Exam: Physical Exam HENT:     Head: Normocephalic.     Mouth/Throat:     Pharynx: No oropharyngeal exudate.  Eyes:     General: Lids are normal.     Conjunctiva/sclera: Conjunctivae normal.     Pupils: Pupils are equal, round, and reactive to light.  Cardiovascular:     Rate and Rhythm: Normal rate and regular rhythm.     Heart sounds: Normal heart sounds, S1 normal and S2 normal.  Pulmonary:     Breath sounds: No decreased breath sounds, wheezing, rhonchi or rales.  Abdominal:     Palpations: Abdomen is soft.     Tenderness: There is no abdominal tenderness.  Musculoskeletal:     Right lower leg: No swelling.     Left lower leg: No swelling.  Skin:    General: Skin is warm.     Findings: No rash.     Comments: Left foot covered.  Neurological:     Mental Status: He is alert and oriented to person, place, and time.         Data Reviewed: Basic Metabolic Panel: Recent Labs  Lab 11/03/19 0840 11/04/19 0425 11/05/19 5176 11/06/19 0419 11/07/19 0521  NA 136  140 138 138 139  K 4.4 4.3 4.1 4.3 5.2*  CL 99 104 102 102 103  CO2 28 27 27 28 29   GLUCOSE 162* 144* 133* 123* 143*  BUN 20 19 18 14 14   CREATININE 0.82 0.86 0.85 0.91 0.94  CALCIUM 9.1 9.1 9.0 9.0 9.0   Liver Function Tests: Recent Labs  Lab 11/01/19 1142  AST 13*  ALT 14  ALKPHOS 40  BILITOT 0.9  PROT 8.1  ALBUMIN 3.8   CBC: Recent Labs  Lab 11/01/19 1142 11/02/19 0640 11/03/19 0840 11/04/19 0425 11/05/19 0638 11/06/19 0419 11/07/19 0521  WBC 10.4   < > 9.9 7.3 7.0 7.2 7.0  NEUTROABS 7.7  --   --   --   --   --   --   HGB 14.7   < > 13.9 13.9 14.8 13.5 13.6  HCT 44.7   < > 42.3 42.3 45.2 41.3 41.6  MCV 84.8   < > 84.1 84.8 85.8 85.5 85.4  PLT 206   < > 224 228 261 227 210   < > = values in this interval not displayed.    CBG: Recent Labs  Lab 11/06/19 1224 11/06/19 1604 11/06/19 1822 11/07/19 0752 11/07/19 1201  GLUCAP 110* 118* 115* 109* 187*  Recent Results (from the past 240 hour(s))  Respiratory Panel by RT PCR (Flu A&B, Covid) - Nasopharyngeal Swab     Status: None   Collection Time: 11/01/19  2:09 PM   Specimen: Nasopharyngeal Swab  Result Value Ref Range Status   SARS Coronavirus 2 by RT PCR NEGATIVE NEGATIVE Final    Comment: (NOTE) SARS-CoV-2 target nucleic acids are NOT DETECTED.  The SARS-CoV-2 RNA is generally detectable in upper respiratoy specimens during the acute phase of infection. The lowest concentration of SARS-CoV-2 viral copies this assay can detect is 131 copies/mL. A negative result does not preclude SARS-Cov-2 infection and should not be used as the sole basis for treatment or other patient management decisions. A negative result may occur with  improper specimen collection/handling, submission of specimen other than nasopharyngeal swab, presence of viral mutation(s) within the areas targeted by this assay, and inadequate number of viral copies (<131 copies/mL). A negative result must be combined with  clinical observations, patient history, and epidemiological information. The expected result is Negative.  Fact Sheet for Patients:  https://www.moore.com/  Fact Sheet for Healthcare Providers:  https://www.young.biz/  This test is no t yet approved or cleared by the [place] FDA and  has been authorized for detection and/or diagnosis of SARS-CoV-2 by FDA under an Emergency Use Authorization (EUA). This EUA will remain  in effect (meaning this test can be used) for the duration of the COVID-19 declaration under Section 564(b)(1) of the Act, 21 U.S.C. section 360bbb-3(b)(1), unless the authorization is terminated or revoked sooner.     Influenza A by PCR NEGATIVE NEGATIVE Final   Influenza B by PCR NEGATIVE NEGATIVE Final    Comment: (NOTE) The Xpert Xpress SARS-CoV-2/FLU/RSV assay is intended as an aid in  the diagnosis of influenza from Nasopharyngeal swab specimens and  should not be used as a sole basis for treatment. Nasal washings and  aspirates are unacceptable for Xpert Xpress SARS-CoV-2/FLU/RSV  testing.  Fact Sheet for Patients: https://www.moore.com/  Fact Sheet for Healthcare Providers: https://www.young.biz/  This test is not yet approved or cleared by the [place] FDA and  has been authorized for detection and/or diagnosis of SARS-CoV-2 by  FDA under an Emergency Use Authorization (EUA). This EUA will remain  in effect (meaning this test can be used) for the duration of the  Covid-19 declaration under Section 564(b)(1) of the Act, 21  U.S.C. section 360bbb-3(b)(1), unless the authorization is  terminated or revoked. Performed at [facility], [address] [place], [place] [address]   CULTURE, BLOOD (ROUTINE X 2) w Reflex to ID Panel     Status: None   Collection Time: 11/01/19  2:10 PM   Specimen: BLOOD RIGHT ARM  Result Value Ref Range Status   Specimen  Description BLOOD RIGHT ARM  Final   Special Requests   Final    BOTTLES DRAWN AEROBIC AND ANAEROBIC Blood Culture results may not be optimal due to an inadequate volume of blood received in culture bottles   Culture   Final    NO GROWTH 5 DAYS Performed at [facility], [address] [place], [place] [address]    Report Status 11/06/2019 FINAL  Final  CULTURE, BLOOD (ROUTINE X 2) w Reflex to ID Panel     Status: None   Collection Time: 11/01/19  2:10 PM   Specimen: BLOOD RIGHT ARM  Result Value Ref Range Status   Specimen Description BLOOD RIGHT ARM  Final   Special Requests  Final    BOTTLES DRAWN AEROBIC AND ANAEROBIC Blood Culture adequate volume   Culture   Final    NO GROWTH 5 DAYS Performed at [facility], [address] [place], [place] [address]    Report Status 11/06/2019 FINAL  Final  Surgical PCR screen     Status: None   Collection Time: 11/01/19  8:04 PM   Specimen: Nasal Mucosa; Nasal Swab  Result Value Ref Range Status   MRSA, PCR NEGATIVE NEGATIVE Final   Staphylococcus aureus NEGATIVE NEGATIVE Final    Comment: (NOTE) The Xpert SA Assay (FDA approved for NASAL specimens in patients 58 years of age and older), is one component of a comprehensive surveillance program. It is not intended to diagnose infection nor to guide or monitor treatment. Performed at [facility], [address] [place], [place] [address]   Aerobic/Anaerobic Culture (surgical/deep wound)     Status: None   Collection Time: 11/02/19 12:15 PM   Specimen: Foot, Left; Abscess  Result Value Ref Range Status   Specimen Description   Final    FOOT LEFT Performed at [facility], [address] [place], [place] [address]    Special Requests   Final    NONE Performed at [facility], [address] [place], [place] [address]    Gram Stain NO WBC SEEN NO ORGANISMS SEEN   Final   Culture   Final    RARE STAPHYLOCOCCUS AUREUS RARE  BACTEROIDES FRAGILIS BETA LACTAMASE POSITIVE Performed at [facility], [address] [place], [place] [address]    Report Status 11/06/2019 FINAL  Final   Organism ID, Bacteria STAPHYLOCOCCUS AUREUS  Final      Susceptibility   Staphylococcus aureus - MIC*    CIPROFLOXACIN <=0.5 SENSITIVE Sensitive     ERYTHROMYCIN <=0.25 SENSITIVE Sensitive     GENTAMICIN <=0.5 SENSITIVE Sensitive     OXACILLIN 0.5 SENSITIVE Sensitive     TETRACYCLINE <=1 SENSITIVE Sensitive     VANCOMYCIN <=0.5 SENSITIVE Sensitive     TRIMETH/SULFA <=10 SENSITIVE Sensitive     CLINDAMYCIN <=0.25 SENSITIVE Sensitive     RIFAMPIN <=0.5 SENSITIVE Sensitive     Inducible Clindamycin NEGATIVE Sensitive     * RARE STAPHYLOCOCCUS AUREUS  Anaerobic culture     Status: None   Collection Time: 11/02/19 12:16 PM   Specimen: Foot, Left; Tissue  Result Value Ref Range Status   Specimen Description   Final    FOOT LEFT Performed at [facility], [address] [place], [place] [address]    Special Requests   Final    NONE Performed at [facility], [address] [place], [place] [address]    Gram Stain   Final    RARE WBC PRESENT, PREDOMINANTLY PMN ABUNDANT GRAM POSITIVE COCCI MODERATE GRAM NEGATIVE RODS Performed at [facility], [address] [place], [place] [address]    Culture   Final    MIXED ANAEROBIC FLORA PRESENT.  CALL LAB IF FURTHER IID REQUIRED.   Report Status 11/04/2019 FINAL  Final     Studies: US EKG SITE RITE  Result Date: 11/07/2019 If Site Rite image not attached, placement could not be confirmed due to current cardiac rhythm.   Scheduled Meds: . aspirin EC  81 mg Oral Daily  . atorvastatin  10 mg Oral Daily  . brimonidine  1 drop Both Eyes BID   And  . timolol  1 drop Both Eyes  BID  . clopidogrel  75 mg Oral Daily  . gabapentin  100 mg Oral TID  . influenza vac split quadrivalent PF  0.5 mL Intramuscular Tomorrow-1000  . insulin aspart  0-5  Units Subcutaneous QHS  . insulin aspart  0-9 Units Subcutaneous TID WC  . lactulose  20 g Oral Daily  . latanoprost  1 drop Both Eyes QHS  . pneumococcal 23 valent vaccine  0.5 mL Intramuscular Tomorrow-1000   Continuous Infusions: . sodium chloride 75 mL/hr at 11/07/19 1404  . ampicillin-sulbactam (UNASYN) IV 3 g (11/07/19 1405)    Assessment/Plan:  1. Abscess left foot with some necrotic area status post debridement.  Osteomyelitis of the sesamoid bone.  Patient on Unasyn currently but infectious disease plans on changing antibiotics to cefazolin and Flagyl upon disposition.  Will need PICC line and transitional care team to set up home health.  Hopefully we can get this all done and potential discharge tomorrow.  IV cefazolin through end date 12/14/2019 as per ID. 2. 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  Patient had a revascular procedure done 2 days ago by vascular surgery.  Hopefully will add Plavix prior to disposition.  Currently on aspirin and gabapentin.  Likely can just go back on Metformin as outpatient. 3. Hyperlipidemia unspecified on Lipitor     Code Status:     Code Status Orders  (From admission, onward)         Start     Ordered   11/01/19 1330  Full code  Continuous        11/01/19 1330        Code Status History    Date Active Date Inactive Code Status Order ID Comments User Context   09/03/2019 1152 09/03/2019 1858 Full Code [id number]  Schnier, Latina Craver, MD Inpatient   Advance Care Planning Activity     Family Communication: Left message for wife Disposition Plan: Status is: Inpatient  Dispo: The patient is from: Home              Anticipated d/c is to: Home              Anticipated d/c date is: Potentially tomorrow 11/08/2019 if able to get PICC line and home health set up for disposition.              Patient currently being treated with IV antibiotics for foot infection.  Consultants:  Infectious  disease  Podiatry  Antibiotics:  Currently on IV Unasyn but antibiotics will be changed over to p.o. Flagyl and cefazolin.  Time spent: 26 minutes  [employer]on [employer]

## 2019-11-07 NOTE — Care Management Important Message (Signed)
Important Message  Patient Details  Name: Reginald Nielsen MRN: [id number] Date of Birth: [birth year]   Medicare Important Message Given:  Yes     Johnell Comings 11/07/2019, 1:34 PM

## 2019-11-07 NOTE — Progress Notes (Signed)
ID Doing well Seen with Dr.Cline  Patient Vitals for the past 24 hrs:  BP Temp Temp src Pulse Resp SpO2  11/07/19 1557 123/72 98.2 F (36.8 C) Oral 65 20 100 %  11/07/19 1203 120/71 98.2 F (36.8 C) Oral 60 16 100 %  11/07/19 0806 123/72 98 F (36.7 C) Oral (!) 54 18 100 %  11/07/19 0555 136/79 98 F (36.7 C) Oral (!) 59 16 100 %  11/07/19 0404 106/65 98.7 F (37.1 C) -- (!) 53 18 100 %  11/07/19 0025 115/76 97.9 F (36.6 C) -- (!) 53 18 100 %  11/06/19 2140 134/75 98.4 F (36.9 C) -- (!) 55 18 100 %  11/06/19 1921 132/72 (!) 97.5 F (36.4 C) -- (!) 51 (!) 25 99 %  11/06/19 1906 130/67 -- -- (!) 47 (!) 24 100 %  11/06/19 1851 125/68 -- -- (!) 56 12 97 %  11/06/19 1836 115/67 -- -- 64 -- 98 %  11/06/19 1824 112/65 (!) 97.2 F (36.2 C) -- (!) 54 19 98 %  11/06/19 1607 118/72 98.1 F (36.7 C) -- (!) 51 16 100 %  awake and alert Chest cta Hss1s2 abd soft     before 2nd surgery     CBC Latest Ref Rng & Units 11/07/2019 11/06/2019 11/05/2019  WBC 4.0 - 10.5 K/uL 7.0 7.2 7.0  Hemoglobin 13.0 - 17.0 g/dL 78.2 42.3 53.6  Hematocrit 39 - 52 % 41.6 41.3 45.2  Platelets 150 - 400 K/uL 210 227 261    CMP Latest Ref Rng & Units 11/07/2019 11/06/2019 11/05/2019  Glucose 70 - 99 mg/dL 144(R) 154(M) 086(P)  BUN 6 - 20 mg/dL 14 14 18   Creatinine 0.61 - 1.24 mg/dL 6.19 5.09  Sodium 135 - 145 mmol/L 139 138 138  Potassium 3.5 - 5.1 mmol/L 5.2(H) 4.3 4.1  Chloride 98 - 111 mmol/L 103 102 102  CO2 22 - 32 mmol/L 29 28 27   Calcium 8.9 - 10.3 mg/dL 9.0 9.0 9.0  Total Protein 6.5 - 8.1 g/dL - - -  Total Bilirubin 0.3 - 1.2 mg/dL - - -  Alkaline Phos 38 - 126 U/L - - -  AST 15 - 41 U/L - - -  ALT 0 - 44 U/L - - -   Impression/Recommendation ?Diabetic foot infection of the left foot  With underlying PAD Sesamoid bone osteo and wound- s/p debridement of abscess and removal of tibial sesamoid Staph aureus and anerobes  in culture-  On  unasyn   .He will need 6 weeks of Iv  antibiotic because underlying osteo/ open wound Will do cefazolin IV and PO flagyl OPAT orders placed DM- says Hba1c is better controlled and < 6   PAD-had angio  Hyperlipidemia on statin  Discussed the management with patient and Dr.Cline

## 2019-11-07 NOTE — Progress Notes (Signed)
1 Day Post-Op   Subjective/Chief Complaint: Patient seen.  Overall states he is doing okay.  Some significant pain in the left foot this morning better controlled with pills than the injection.  Relates some increased pain with trying to use the wedge shoe.   Objective: Vital signs in last 24 hours: Temp:  [97.2 F (36.2 C)-98.7 F (37.1 C)] 98.2 F (36.8 C) (10/28 1557) Pulse Rate:  [47-65] 65 (10/28 1557) Resp:  [12-25] 20 (10/28 1557) BP: (106-136)/(65-79) 123/72 (10/28 1557) SpO2:  [97 %-100 %] 100 % (10/28 1557) Last BM Date: 11/05/19  Intake/Output from previous day: 10/27 0701 - 10/28 0700 In: 820 [P.O.:120; I.V.:700] Out: 3400 [Urine:3400] Intake/Output this shift: Total I/O In: 1554.2 [P.O.:240; I.V.:1087.1; IV Piggyback:227.1] Out: 1300 [Urine:1300]  Bandage on the left foot is dry and intact.  Upon removal there is only mild bleeding on the bandaging.  Mepitel and antibiotic beads intact.  Significantly improved erythema and edema in the left foot.  Still some hyperpigmented discoloration along the medial aspect of the hallux but no clear evidence of necrosis.      Lab Results:  Recent Labs    11/06/19 0419 11/07/19 0521  WBC 7.2 7.0  HGB 13.5 13.6  HCT 41.3 41.6  PLT 227 210   BMET Recent Labs    11/06/19 0419 11/07/19 0521  NA 138 139  K 4.3 5.2*  CL 102 103  CO2 28 29  GLUCOSE 123* 143*  BUN 14 14  CREATININE 0.91 0.94  CALCIUM 9.0 9.0   PT/INR No results for input(s): LABPROT, INR in the last 72 hours. ABG No results for input(s): PHART, HCO3 in the last 72 hours.  Invalid input(s): PCO2, PO2  Studies/Results: [place] EKG SITE RITE  Result Date: 11/07/2019 If Site Rite image not attached, placement could not be confirmed due to current cardiac rhythm.   Anti-infectives: Anti-infectives (From admission, onward)   Start     Dose/Rate Route Frequency Ordered Stop   11/07/19 2200  ceFAZolin (ANCEF) IVPB 2g/100 mL premix        2 g 200  mL/hr over 30 Minutes Intravenous Every 8 hours 11/07/19 1712     11/07/19 2200  metroNIDAZOLE (FLAGYL) tablet 500 mg        500 mg Oral Every 8 hours 11/07/19 1712     11/06/19 0200  vancomycin (VANCOREADY) IVPB 1750 mg/350 mL  Status:  Discontinued        1,750 mg 175 mL/hr over 120 Minutes Intravenous Every 12 hours 11/05/19 1425 11/06/19 1710   11/05/19 1000  ceFAZolin (ANCEF) IVPB 2g/100 mL premix  Status:  Discontinued       Note to Pharmacy: To be given in specials   2 g 200 mL/hr over 30 Minutes Intravenous  Once 11/05/19 0959 11/05/19 1253   11/05/19 0928  ceFAZolin (ANCEF) 2-4 GM/100ML-% IVPB       Note to Pharmacy: Eden Lathe  : cabinet override      11/05/19 0928 11/05/19 2144   11/05/19 0600  vancomycin (VANCOCIN) IVPB 1000 mg/200 mL premix  Status:  Discontinued        1,000 mg 200 mL/hr over 60 Minutes Intravenous Every 8 hours 11/04/19 1910 11/05/19 1425   11/04/19 2000  Ampicillin-Sulbactam (UNASYN) 3 g in sodium chloride 0.9 % 100 mL IVPB  Status:  Discontinued        3 g 200 mL/hr over 30 Minutes Intravenous Every 6 hours 11/04/19 1843 11/07/19 1711   11/04/19  2000  vancomycin (VANCOREADY) IVPB 2000 mg/400 mL        2,000 mg 200 mL/hr over 120 Minutes Intravenous  Once 11/04/19 1845 11/04/19 2247   11/01/19 1400  cefTRIAXone (ROCEPHIN) 1 g in sodium chloride 0.9 % 100 mL IVPB  Status:  Discontinued        1 g 200 mL/hr over 30 Minutes Intravenous Every 24 hours 11/01/19 1330 11/04/19 1830   11/01/19 1345  vancomycin (VANCOREADY) IVPB 2000 mg/400 mL  Status:  Discontinued        2,000 mg 200 mL/hr over 120 Minutes Intravenous  Once 11/01/19 1300 11/01/19 1327   11/01/19 1300  piperacillin-tazobactam (ZOSYN) IVPB 3.375 g  Status:  Discontinued        3.375 g 100 mL/hr over 30 Minutes Intravenous  Once 11/01/19 1256 11/01/19 1327      Assessment/Plan: s/p Procedure(s): IRRIGATION AND DEBRIDEMENT FOOT (Left) Assessment: Stable but guarded condition status  post I&D   Plan: Bulky dry sterile dressing reapplied to the left foot.  Patient was instructed again for limited weight on the left foot with pressure only on the heel as needed for transfers.  States he has crutches and is looking to obtain a scooter.  At this point I think the patient is stable enough for discharge tomorrow on IV antibiotics, managed by infectious disease.  Plan for close follow-up as we did discuss that if the wound takes a turn for the worse that he could be at risk for a first ray amputation.  Plan for follow-up on Tuesday.  LOS: 5 days    [name] 11/07/2019

## 2019-11-08 DIAGNOSIS — L02612 Cutaneous abscess of left foot: Secondary | ICD-10-CM | POA: Diagnosis not present

## 2019-11-08 DIAGNOSIS — E1151 Type 2 diabetes mellitus with diabetic peripheral angiopathy without gangrene: Secondary | ICD-10-CM | POA: Diagnosis not present

## 2019-11-08 DIAGNOSIS — M86172 Other acute osteomyelitis, left ankle and foot: Secondary | ICD-10-CM | POA: Diagnosis not present

## 2019-11-08 DIAGNOSIS — E785 Hyperlipidemia, unspecified: Secondary | ICD-10-CM | POA: Diagnosis not present

## 2019-11-08 LAB — GLUCOSE, CAPILLARY
Glucose-Capillary: 119 mg/dL — ABNORMAL HIGH (ref 70–99)
Glucose-Capillary: 151 mg/dL — ABNORMAL HIGH (ref 70–99)
Glucose-Capillary: 117 mg/dL — ABNORMAL HIGH (ref 70–99)
Glucose-Capillary: 107 mg/dL — ABNORMAL HIGH (ref 70–99)

## 2019-11-08 LAB — HEMOGLOBIN A1C
Hgb A1c MFr Bld: 5.4 % (ref 4.8–5.6)
Mean Plasma Glucose: 108 mg/dL

## 2019-11-08 MED ORDER — CEFAZOLIN IV (FOR PTA / DISCHARGE USE ONLY): 2.0000 g | Freq: Three times a day (TID) | INTRAVENOUS | 0 refills | Status: DC

## 2019-11-08 MED ORDER — OXYCODONE-ACETAMINOPHEN 5-325 MG PO TABS
1.0000 | ORAL_TABLET | Freq: Four times a day (QID) | ORAL | 0 refills | Status: DC | PRN
Start: 2019-11-08 — End: 2019-12-12

## 2019-11-08 MED ORDER — METRONIDAZOLE 500 MG PO TABS: 500.0000 mg | ORAL_TABLET | Freq: Three times a day (TID) | ORAL | 0 refills | Status: AC

## 2019-11-08 MED ORDER — SODIUM CHLORIDE 0.9% FLUSH: 10.0000 mL | INTRAVENOUS | Status: DC | PRN

## 2019-11-08 MED ORDER — CHLORHEXIDINE GLUCONATE CLOTH 2 % EX PADS
6.0000 | MEDICATED_PAD | Freq: Every day | CUTANEOUS | Status: DC
  Administered 2019-11-08 – 2019-11-09 (×2): 6 via TOPICAL

## 2019-11-08 MED ORDER — GABAPENTIN 100 MG PO CAPS
100.0000 mg | ORAL_CAPSULE | Freq: Three times a day (TID) | ORAL | 0 refills | Status: DC
Start: 2019-11-08 — End: 2020-01-20

## 2019-11-08 NOTE — TOC Transition Note (Signed)
Transition of Care [facility]) - CM/SW Discharge Note   Patient Details  Name: Reginald Nielsen MRN: [id number] Date of Birth: 07-04-1960  Transition of [facility]) CM/SW Contact:  Eilleen Kempf, LCSW Phone Number: 11/08/2019, 2:39 PM   Clinical Narrative:    CSW has secured agency to provide IV-antibiotics and will also provide RN when patient is discharged. Kerri with [phone]          Patient Goals and CMS Choice        Discharge Placement                       Discharge Plan and Services                                     Social Determinants of Health (SDOH) Interventions     Readmission Risk Interventions No flowsheet data found.

## 2019-11-08 NOTE — Progress Notes (Signed)
PHARMACY CONSULT NOTE FOR:  OUTPATIENT  PARENTERAL ANTIBIOTIC THERAPY (OPAT)  Indication: MSSA foot infection and osteomyelitis  Regimen: Cefazolin 2gm IV q8h  (also to be on metronidazole 500mg  PO TID until 11/24/2019) End date: 12/14/2019  IV antibiotic discharge orders are pended. To discharging provider:  please sign these orders via discharge navigator,  Select New Orders & click on the button choice - Manage This Unsigned Work.     Thank you for allowing pharmacy to be a part of this patient's care.  14/04/2019, PharmD, BCPS.   Work Cell: [phone] 11/08/2019 8:21 AM

## 2019-11-08 NOTE — Progress Notes (Signed)
Peripherally Inserted Central Catheter Placement  The IV Nurse has discussed with the patient and/or persons authorized to consent for the patient, the purpose of this procedure and the potential benefits and risks involved with this procedure.  The benefits include less needle sticks, lab draws from the catheter, and the patient may be discharged home with the catheter. Risks include, but not limited to, infection, bleeding, blood clot (thrombus formation), and puncture of an artery; nerve damage and irregular heartbeat and possibility to perform a PICC exchange if needed/ordered by physician.  Alternatives to this procedure were also discussed.  Bard Power PICC patient education guide, fact sheet on infection prevention and patient information card has been provided to patient /or left at bedside.    PICC Placement Documentation  PICC Single Lumen 11/08/19 PICC Right Basilic 44 cm 0 cm (Active)  Indication for Insertion or Continuance of Line Home intravenous therapies (PICC only) 11/08/19 1040  Exposed Catheter (cm) 0 cm 11/08/19 1040  Site Assessment Clean;Dry;Intact 11/08/19 1040  Line Status Flushed;Blood return noted;Saline locked 11/08/19 1040  Dressing Type Transparent 11/08/19 1040  Dressing Status Clean;Dry;Intact 11/08/19 1040  Dressing Change Due 11/15/19 11/08/19 1040       Reginald Nielsen 11/08/2019, 10:54 AM

## 2019-11-08 NOTE — Progress Notes (Signed)
Patient ID: Reginald Nielsen, male   DOB: [birth year], 59 y.o.   MRN: [id number] [facility] PROGRESS NOTE  Jaimon Bugaj IRS:[id number] DOB: 03-09-1960 DOA: 11/01/2019 PCP: Duanne Limerick, MD  HPI/Subjective: Patient does have some soreness in his foot.  PICC line placed today.  His wife was unable to come for the 2 PM antibiotic dose today.  Patient would rather go home tomorrow morning.  Being treated for left foot abscess and osteomyelitis.  Objective: Vitals:   11/08/19 0328 11/08/19 1156  BP: (!) 93/59 127/70  Pulse: 65 (!) 48  Resp: 16 18  Temp: 98 F (36.7 C) 98 F (36.7 C)  SpO2: 95% 100%    Intake/Output Summary (Last 24 hours) at 11/08/2019 1451 Last data filed at 11/08/2019 1034 Gross per 24 hour  Intake 1554.2 ml  Output 1750 ml  Net -195.8 ml   Filed Weights   11/01/19 1139 11/05/19 1016  Weight: 99.8 kg 99.8 kg    ROS: Review of Systems  Respiratory: Negative for shortness of breath.   Cardiovascular: Negative for chest pain.  Gastrointestinal: Negative for abdominal pain, nausea and vomiting.  Musculoskeletal: Positive for joint pain.   Exam: Physical Exam HENT:     Head: Normocephalic.     Mouth/Throat:     Pharynx: No oropharyngeal exudate.  Eyes:     General: Lids are normal.     Conjunctiva/sclera: Conjunctivae normal.     Pupils: Pupils are equal, round, and reactive to light.  Cardiovascular:     Rate and Rhythm: Normal rate and regular rhythm.     Heart sounds: Normal heart sounds, S1 normal and S2 normal.  Pulmonary:     Breath sounds: No decreased breath sounds, wheezing, rhonchi or rales.  Abdominal:     Palpations: Abdomen is soft.     Tenderness: There is no abdominal tenderness.  Musculoskeletal:     Right lower leg: No swelling.     Left lower leg: No swelling.  Skin:    General: Skin is warm.     Findings: No rash.  Neurological:     Mental Status: He is alert and oriented to person, place, and time.        Data Reviewed: Basic Metabolic Panel: Recent Labs  Lab 11/03/19 0840 11/04/19 0425 11/05/19 0093 11/06/19 0419 11/07/19 0521  NA 136 140 138 138 139  K 4.4 4.3 4.1 4.3 5.2*  CL 99 104 102 102 103  CO2 28 27 27 28 29   GLUCOSE 162* 144* 133* 123* 143*  BUN 20 19 18 14 14   CREATININE 0.82 0.86 0.85 0.91 0.94  CALCIUM 9.1 9.1 9.0 9.0 9.0   Liver Function Tests: No results for input(s): AST, ALT, ALKPHOS, BILITOT, PROT, ALBUMIN in the last 168 hours. No results for input(s): LIPASE, AMYLASE in the last 168 hours. No results for input(s): AMMONIA in the last 168 hours. CBC: Recent Labs  Lab 11/03/19 0840 11/04/19 0425 11/05/19 11/06/19 11/06/19 0419 11/07/19 0521  WBC 9.9 7.3 7.0 7.2 7.0  HGB 13.9 13.9 14.8 13.5 13.6  HCT 42.3 42.3 45.2 41.3 41.6  MCV 84.1 84.8 85.8 85.5 85.4  PLT 224 228 261 227 210    CBG: Recent Labs  Lab 11/07/19 1201 11/07/19 1710 11/07/19 2218 11/08/19 0809 11/08/19 1157  GLUCAP 187* 139* 142* 119* 151*    Recent Results (from the past 240 hour(s))  Respiratory Panel by RT PCR (Flu A&B, Covid) - Nasopharyngeal Swab     Status:  None   Collection Time: 11/01/19  2:09 PM   Specimen: Nasopharyngeal Swab  Result Value Ref Range Status   SARS Coronavirus 2 by RT PCR NEGATIVE NEGATIVE Final    Comment: (NOTE) SARS-CoV-2 target nucleic acids are NOT DETECTED.  The SARS-CoV-2 RNA is generally detectable in upper respiratoy specimens during the acute phase of infection. The lowest concentration of SARS-CoV-2 viral copies this assay can detect is 131 copies/mL. A negative result does not preclude SARS-Cov-2 infection and should not be used as the sole basis for treatment or other patient management decisions. A negative result may occur with  improper specimen collection/handling, submission of specimen other than nasopharyngeal swab, presence of viral mutation(s) within the areas targeted by this assay, and inadequate number of viral  copies (<131 copies/mL). A negative result must be combined with clinical observations, patient history, and epidemiological information. The expected result is Negative.  Fact Sheet for Patients:  https://www.moore.com/  Fact Sheet for Healthcare Providers:  https://www.young.biz/  This test is no t yet approved or cleared by the [place] FDA and  has been authorized for detection and/or diagnosis of SARS-CoV-2 by FDA under an Emergency Use Authorization (EUA). This EUA will remain  in effect (meaning this test can be used) for the duration of the COVID-19 declaration under Section 564(b)(1) of the Act, 21 U.S.C. section 360bbb-3(b)(1), unless the authorization is terminated or revoked sooner.     Influenza A by PCR NEGATIVE NEGATIVE Final   Influenza B by PCR NEGATIVE NEGATIVE Final    Comment: (NOTE) The Xpert Xpress SARS-CoV-2/FLU/RSV assay is intended as an aid in  the diagnosis of influenza from Nasopharyngeal swab specimens and  should not be used as a sole basis for treatment. Nasal washings and  aspirates are unacceptable for Xpert Xpress SARS-CoV-2/FLU/RSV  testing.  Fact Sheet for Patients: https://www.moore.com/  Fact Sheet for Healthcare Providers: https://www.young.biz/  This test is not yet approved or cleared by the [place] FDA and  has been authorized for detection and/or diagnosis of SARS-CoV-2 by  FDA under an Emergency Use Authorization (EUA). This EUA will remain  in effect (meaning this test can be used) for the duration of the  Covid-19 declaration under Section 564(b)(1) of the Act, 21  U.S.C. section 360bbb-3(b)(1), unless the authorization is  terminated or revoked. Performed at [facility], [address] [place], [place] [address]   CULTURE, BLOOD (ROUTINE X 2) w Reflex to ID Panel     Status: None   Collection Time: 11/01/19  2:10 PM    Specimen: BLOOD RIGHT ARM  Result Value Ref Range Status   Specimen Description BLOOD RIGHT ARM  Final   Special Requests   Final    BOTTLES DRAWN AEROBIC AND ANAEROBIC Blood Culture results may not be optimal due to an inadequate volume of blood received in culture bottles   Culture   Final    NO GROWTH 5 DAYS Performed at [facility], [address] [place], [place] [address]    Report Status 11/06/2019 FINAL  Final  CULTURE, BLOOD (ROUTINE X 2) w Reflex to ID Panel     Status: None   Collection Time: 11/01/19  2:10 PM   Specimen: BLOOD RIGHT ARM  Result Value Ref Range Status   Specimen Description BLOOD RIGHT ARM  Final   Special Requests   Final    BOTTLES DRAWN AEROBIC AND ANAEROBIC Blood Culture adequate volume   Culture   Final    NO  GROWTH 5 DAYS Performed at [facility], [address] [place] [place], [place] [address]    Report Status 11/06/2019 FINAL  Final  Surgical PCR screen     Status: None   Collection Time: 11/01/19  8:04 PM   Specimen: Nasal Mucosa; Nasal Swab  Result Value Ref Range Status   MRSA, PCR NEGATIVE NEGATIVE Final   Staphylococcus aureus NEGATIVE NEGATIVE Final    Comment: (NOTE) The Xpert SA Assay (FDA approved for NASAL specimens in patients 58 years of age and older), is one component of a comprehensive surveillance program. It is not intended to diagnose infection nor to guide or monitor treatment. Performed at [facility], [address] [place], [place] [address]   Aerobic/Anaerobic Culture (surgical/deep wound)     Status: None   Collection Time: 11/02/19 12:15 PM   Specimen: Foot, Left; Abscess  Result Value Ref Range Status   Specimen Description   Final    FOOT LEFT Performed at [facility], [address], [place], [place] [address]    Special Requests   Final    NONE Performed at [facility], [address] [place], [place] [address]    Gram Stain NO WBC SEEN NO ORGANISMS  SEEN   Final   Culture   Final    RARE STAPHYLOCOCCUS AUREUS RARE BACTEROIDES FRAGILIS BETA LACTAMASE POSITIVE Performed at [facility], [address], [place], [place] [address]    Report Status 11/06/2019 FINAL  Final   Organism ID, Bacteria STAPHYLOCOCCUS AUREUS  Final      Susceptibility   Staphylococcus aureus - MIC*    CIPROFLOXACIN <=0.5 SENSITIVE Sensitive     ERYTHROMYCIN <=0.25 SENSITIVE Sensitive     GENTAMICIN <=0.5 SENSITIVE Sensitive     OXACILLIN 0.5 SENSITIVE Sensitive     TETRACYCLINE <=1 SENSITIVE Sensitive     VANCOMYCIN <=0.5 SENSITIVE Sensitive     TRIMETH/SULFA <=10 SENSITIVE Sensitive     CLINDAMYCIN <=0.25 SENSITIVE Sensitive     RIFAMPIN <=0.5 SENSITIVE Sensitive     Inducible Clindamycin NEGATIVE Sensitive     * RARE STAPHYLOCOCCUS AUREUS  Anaerobic culture     Status: None   Collection Time: 11/02/19 12:16 PM   Specimen: Foot, Left; Tissue  Result Value Ref Range Status   Specimen Description   Final    FOOT LEFT Performed at [facility], [address], [place], [place] [address]    Special Requests   Final    NONE Performed at [facility], [address] [place], [place] [address]    Gram Stain   Final    RARE WBC PRESENT, PREDOMINANTLY PMN ABUNDANT GRAM POSITIVE COCCI MODERATE GRAM NEGATIVE RODS Performed at [facility], [address], [place], [place] [address]    Culture   Final    MIXED ANAEROBIC FLORA PRESENT.  CALL LAB IF FURTHER IID REQUIRED.   Report Status 11/04/2019 FINAL  Final     Studies: [place] EKG SITE RITE  Result Date: 11/07/2019 If Site Rite image not attached, placement could not be confirmed due to current cardiac rhythm.   Scheduled Meds: . aspirin EC  81 mg Oral Daily  . atorvastatin  10 mg Oral Daily  . brimonidine  1 drop Both Eyes BID   And  . timolol  1 drop Both Eyes BID  . Chlorhexidine Gluconate Cloth  6 each Topical Daily  . clopidogrel  75 mg Oral Daily  .  gabapentin  100 mg Oral TID  . influenza vac split quadrivalent PF  0.5 mL Intramuscular Tomorrow-1000  . insulin aspart  0-5 Units Subcutaneous QHS  . insulin aspart  0-9 Units Subcutaneous TID WC  . lactulose  20 g Oral Daily  . latanoprost  1 drop Both Eyes QHS  . metroNIDAZOLE  500 mg Oral Q8H  . pneumococcal 23 valent vaccine  0.5 mL Intramuscular Tomorrow-1000   Continuous Infusions: .  ceFAZolin (ANCEF) IV 2 g (11/08/19 1417)    Assessment/Plan:  1. Abscess left foot with some necrotic area status post debridement.  Osteomyelitis of the sesamoid bone.  Antibiotics switched over to Ancef and Flagyl.  PICC line placed today.  Home health set up and will come out tomorrow.  Patient will go home tomorrow after 6 AM dose. 2. 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  Patient back on aspirin and Plavix.  Gabapentin added.  Can go back on Metformin as outpatient. 3. Hyperlipidemia unspecified on Lipitor    Code Status:     Code Status Orders  (From admission, onward)         Start     Ordered   11/01/19 1330  Full code  Continuous        11/01/19 1330        Code Status History    Date Active Date Inactive Code Status Order ID Comments User Context   09/03/2019 1152 09/03/2019 1858 Full Code [id number]  Schnier, Latina CraverGregory G, MD Inpatient   Advance Care Planning Activity     Family Communication: Spoke with wife on the phone Disposition Plan: Status is: Inpatient  Dispo: The patient is from: Home              Anticipated d/c is to: Home with home health              Anticipated d/c date is: 11/09/2019              Patient currently receiving IV antibiotics for abscess and osteomyelitis.  We will go home tomorrow with home health follow-up and infectious disease follow-up for IV antibiotics.  Time spent: 28 minutes  Makhi M[employer]  [facility]

## 2019-11-09 DIAGNOSIS — E785 Hyperlipidemia, unspecified: Secondary | ICD-10-CM | POA: Diagnosis not present

## 2019-11-09 DIAGNOSIS — E1151 Type 2 diabetes mellitus with diabetic peripheral angiopathy without gangrene: Secondary | ICD-10-CM | POA: Diagnosis not present

## 2019-11-09 DIAGNOSIS — L02612 Cutaneous abscess of left foot: Secondary | ICD-10-CM | POA: Diagnosis not present

## 2019-11-09 DIAGNOSIS — M86172 Other acute osteomyelitis, left ankle and foot: Secondary | ICD-10-CM | POA: Diagnosis not present

## 2019-11-09 LAB — CBC
HCT: 40.9 % (ref 39.0–52.0)
Hemoglobin: 13.5 g/dL (ref 13.0–17.0)
MCH: 27.6 pg (ref 26.0–34.0)
MCHC: 33 g/dL (ref 30.0–36.0)
MCV: 83.5 fL (ref 80.0–100.0)
Platelets: 254 10*3/uL (ref 150–400)
RBC: 4.9 MIL/uL (ref 4.22–5.81)
RDW: 12.9 % (ref 11.5–15.5)
WBC: 7.4 10*3/uL (ref 4.0–10.5)
nRBC: 0 % (ref 0.0–0.2)

## 2019-11-09 LAB — C-REACTIVE PROTEIN: CRP: 6.4 mg/dL — ABNORMAL HIGH (ref <1.0)

## 2019-11-09 LAB — BASIC METABOLIC PANEL
Anion gap: 7 (ref 5–15)
BUN: 19 mg/dL (ref 6–20)
CO2: 26 mmol/L (ref 22–32)
Calcium: 8.9 mg/dL (ref 8.9–10.3)
Chloride: 103 mmol/L (ref 98–111)
Creatinine, Ser: 0.81 mg/dL (ref 0.61–1.24)
GFR, Estimated: >60 mL/min (ref >60)
Glucose, Bld: 144 mg/dL — ABNORMAL HIGH (ref 70–99)
Potassium: 3.9 mmol/L (ref 3.5–5.1)
Sodium: 136 mmol/L (ref 135–145)

## 2019-11-09 LAB — GLUCOSE, CAPILLARY: Glucose-Capillary: 110 mg/dL — ABNORMAL HIGH (ref 70–99)

## 2019-11-09 NOTE — Discharge Summary (Signed)
[facility] [place] NAME: Reginald Nielsen    MR#:  [id number]  DATE OF BIRTH:  June 28, 1960  DATE OF ADMISSION:  11/01/2019 ADMITTING PHYSICIAN: Lavina Hamman, MD  DATE OF DISCHARGE: 11/09/2019 10:16 AM  PRIMARY CARE PHYSICIAN: Juline Patch, MD    ADMISSION DIAGNOSIS:  Left foot infection [L08.9] Diabetic ulcer of left midfoot associated with type 2 diabetes mellitus, with necrosis of muscle ([place]) [F81.017, L97.423] Osteomyelitis ([place]) [M86.9]  DISCHARGE DIAGNOSIS:  Principal Problem:   Left foot infection Active Problems:   DM (diabetes mellitus), type 2 with peripheral vascular complications (HCC)   PAD (peripheral artery disease) (HCC)   HLD (hyperlipidemia)   Hypertension   Osteomyelitis (HCC)   Abscess of left foot   SECONDARY DIAGNOSIS:   Past Medical History:  Diagnosis Date  . Diabetes mellitus without complication ([place])    type 2  . Glaucoma   . Hyperlipidemia   . Hypertension   . Shoulder pain, left     HOSPITAL COURSE:   1.  Abscess left foot with some necrotic areas.  The patient is status post debridement by Dr. Cleda Mccreedy on 11/06/2019.  Patient on Ancef and Flagyl.  PICC line placed yesterday.  Home health set up.  Patient will receive IV Ancef 2 g every 8 hours through 01/03/2020.  PICC line care as per home health.  PICC line to be removed at the end of course.  Patient will be on oral Flagyl 3 times a day through 11/24/2019.  Weekly labs as per Dr. Steva Ready infectious disease.  Podiatry Dr. Cleda Mccreedy will follow up and do dressing change on Tuesday.  Patient is interested in keeping his toes and understands the need for long-term antibiotics.  CRP upon discharge down to 6.4.  White blood cell count 7.4 upon discharge. 2.  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  Patient had a revascularization procedure by vascular surgery here in the hospital.  Patient on aspirin and Plavix.  Added low-dose  gabapentin.  Patient will go back on Metformin as outpatient.  Discontinue glipizide with hemoglobin A1c being low at 5.4. 3.  Hyperlipidemia unspecified on Lipitor  DISCHARGE CONDITIONS:   Satisfactory  CONSULTS OBTAINED:  Treatment Team:  Sharlotte Alamo, DPM  Vascular surgery Infectious disease  DRUG ALLERGIES:  No Known Allergies  DISCHARGE MEDICATIONS:   Allergies as of 11/09/2019   No Known Allergies     Medication List    STOP taking these medications   glipiZIDE 5 MG tablet Commonly known as: GLUCOTROL   HYDROcodone-acetaminophen 5-325 MG tablet Commonly known as: NORCO/VICODIN   ibuprofen 200 MG tablet Commonly known as: ADVIL     TAKE these medications   aspirin EC 81 MG tablet Take 81 mg by mouth daily.   atorvastatin 10 MG tablet Commonly known as: Lipitor Take 1 tablet (10 mg total) by mouth daily. Additional refills per the patient's primary care   ceFAZolin  IVPB Commonly known as: ANCEF Inject 2 g into the vein every 8 (eight) hours. Indication: MSSA foot infection with osteomyelitis First Dose: Yes Last Day of Therapy:  12/14/2019 Labs - Once weekly on Monday:  CBC/D and CMP, Labs - Every other week on Monday:  ESR and CRP Method of administration: IV Push Method of administration may be changed at the discretion of home infusion pharmacist based upon assessment of the patient and/or caregiver's ability to self-administer the medication ordered.   clopidogrel 75 MG tablet Commonly  known as: Plavix Take 1 tablet (75 mg total) by mouth daily.   Combigan 0.2-0.5 % ophthalmic solution Generic drug: brimonidine-timolol Place 1 drop into both eyes 2 (two) times daily.   gabapentin 100 MG capsule Commonly known as: NEURONTIN Take 1 capsule (100 mg total) by mouth 3 (three) times daily.   latanoprost 0.005 % ophthalmic solution Commonly known as: XALATAN Place 1 drop into both eyes at bedtime.   metFORMIN 500 MG tablet Commonly known as:  GLUCOPHAGE Take 1 tablet (500 mg total) by mouth 2 (two) times daily with a meal.   metroNIDAZOLE 500 MG tablet Commonly known as: FLAGYL Take 1 tablet (500 mg total) by mouth every 8 (eight) hours for 52 doses.   ONE TOUCH ULTRA 2 w/Device Kit   OneTouch Delica Plus [id number] Misc USE DAILY   OneTouch Ultra test strip Generic drug: glucose blood USE TO TEST BLOOD SUGAR ONCE DAILY   oxyCODONE-acetaminophen 5-325 MG tablet Commonly known as: PERCOCET/ROXICET Take 1 tablet by mouth every 6 (six) hours as needed for moderate pain or severe pain.            Discharge Care Instructions  (From admission, onward)         Start     Ordered   11/08/19 0000  Change dressing on IV access line weekly and PRN  (Home infusion instructions - [facility] )        11/08/19 1058           DISCHARGE INSTRUCTIONS:   Follow-up with Dr. Cleda Mccreedy podiatry on Tuesday Follow-up vascular surgery in a few weeks Follow-up Dr. Steva Ready infectious disease in 3 weeks  If you experience worsening of your admission symptoms, develop shortness of breath, life threatening emergency, suicidal or homicidal thoughts you must seek medical attention immediately by calling 911 or calling your MD immediately  if symptoms less severe.  You Must read complete instructions/literature along with all the possible adverse reactions/side effects for all the Medicines you take and that have been prescribed to you. Take any new Medicines after you have completely understood and accept all the possible adverse reactions/side effects.   Please note  You were cared for by a hospitalist during your hospital stay. If you have any questions about your discharge medications or the care you received while you were in the hospital after you are discharged, you can call the unit and asked to speak with the hospitalist on call if the hospitalist that took care of you is not available. Once you are discharged, your  primary care physician will handle any further medical issues. Please note that NO REFILLS for any discharge medications will be authorized once you are discharged, as it is imperative that you return to your primary care physician (or establish a relationship with a primary care physician if you do not have one) for your aftercare needs so that they can reassess your need for medications and monitor your lab values.    Today   CHIEF COMPLAINT:   Chief Complaint  Patient presents with  . Wound Infection    HISTORY OF PRESENT ILLNESS:  Reginald Nielsen  is a 59 y.o. male came in with wound infection   VITAL SIGNS:  Blood pressure 123/70, pulse 63, temperature 98.1 F (36.7 C), temperature source Oral, resp. rate 18, height '6\' 5"'  (1.956 m), weight 99.8 kg, SpO2 98 %.  I/O:    Intake/Output Summary (Last 24 hours) at 11/09/2019 1403 Last data filed at 11/09/2019  8527 Gross per 24 hour  Intake --  Output 1150 ml  Net -1150 ml    PHYSICAL EXAMINATION:  GENERAL:  59 y.o.-year-old patient lying in the bed with no acute distress.  EYES: Pupils equal, round, reactive to light and accommodation. No scleral icterus. Extraocular muscles intact.  HEENT: Head atraumatic, normocephalic. Oropharynx and nasopharynx clear.   LUNGS: Normal breath sounds bilaterally, no wheezing, rales,rhonchi or crepitation. No use of accessory muscles of respiration.  CARDIOVASCULAR: S1, S2 normal. No murmurs, rubs, or gallops.  ABDOMEN: Soft, non-tender, non-distended. Bowel sounds present. No organomegaly or mass.  EXTREMITIES: No pedal edema.  NEUROLOGIC: Cranial nerves II through XII are intact. Muscle strength 5/5 in all extremities. Sensation intact. Gait not checked.  PSYCHIATRIC: The patient is alert and oriented x 3.  SKIN: Left foot covered.  DATA REVIEW:   CBC Recent Labs  Lab 11/09/19 0510  WBC 7.4  HGB 13.5  HCT 40.9  PLT 254    Chemistries  Recent Labs  Lab 11/09/19 0510  NA 136   K 3.9  CL 103  CO2 26  GLUCOSE 144*  BUN 19  CREATININE 0.81  CALCIUM 8.9    Microbiology Results  Results for orders placed or performed during the hospital encounter of 11/01/19  Respiratory Panel by RT PCR (Flu A&B, Covid) - Nasopharyngeal Swab     Status: None   Collection Time: 11/01/19  2:09 PM   Specimen: Nasopharyngeal Swab  Result Value Ref Range Status   SARS Coronavirus 2 by RT PCR NEGATIVE NEGATIVE Final    Comment: (NOTE) SARS-CoV-2 target nucleic acids are NOT DETECTED.  The SARS-CoV-2 RNA is generally detectable in upper respiratoy specimens during the acute phase of infection. The lowest concentration of SARS-CoV-2 viral copies this assay can detect is 131 copies/mL. A negative result does not preclude SARS-Cov-2 infection and should not be used as the sole basis for treatment or other patient management decisions. A negative result may occur with  improper specimen collection/handling, submission of specimen other than nasopharyngeal swab, presence of viral mutation(s) within the areas targeted by this assay, and inadequate number of viral copies (<131 copies/mL). A negative result must be combined with clinical observations, patient history, and epidemiological information. The expected result is Negative.  Fact Sheet for Patients:  PinkCheek.be  Fact Sheet for Healthcare Providers:  GravelBags.it  This test is no t yet approved or cleared by the [place] FDA and  has been authorized for detection and/or diagnosis of SARS-CoV-2 by FDA under an Emergency Use Authorization (EUA). This EUA will remain  in effect (meaning this test can be used) for the duration of the COVID-19 declaration under Section 564(b)(1) of the Act, 21 U.S.C. section 360bbb-3(b)(1), unless the authorization is terminated or revoked sooner.     Influenza A by PCR NEGATIVE NEGATIVE Final   Influenza B by PCR NEGATIVE  NEGATIVE Final    Comment: (NOTE) The Xpert Xpress SARS-CoV-2/FLU/RSV assay is intended as an aid in  the diagnosis of influenza from Nasopharyngeal swab specimens and  should not be used as a sole basis for treatment. Nasal washings and  aspirates are unacceptable for Xpert Xpress SARS-CoV-2/FLU/RSV  testing.  Fact Sheet for Patients: PinkCheek.be  Fact Sheet for Healthcare Providers: GravelBags.it  This test is not yet approved or cleared by the [place] FDA and  has been authorized for detection and/or diagnosis of SARS-CoV-2 by  FDA under an Emergency Use Authorization (EUA). This EUA will remain  in  effect (meaning this test can be used) for the duration of the  Covid-19 declaration under Section 564(b)(1) of the Act, 21  U.S.C. section 360bbb-3(b)(1), unless the authorization is  terminated or revoked. Performed at [facility], [address] [place], [place] [address]   CULTURE, BLOOD (ROUTINE X 2) w Reflex to ID Panel     Status: None   Collection Time: 11/01/19  2:10 PM   Specimen: BLOOD RIGHT ARM  Result Value Ref Range Status   Specimen Description BLOOD RIGHT ARM  Final   Special Requests   Final    BOTTLES DRAWN AEROBIC AND ANAEROBIC Blood Culture results may not be optimal due to an inadequate volume of blood received in culture bottles   Culture   Final    NO GROWTH 5 DAYS Performed at [facility], [address] [place], [place] [address]    Report Status 11/06/2019 FINAL  Final  CULTURE, BLOOD (ROUTINE X 2) w Reflex to ID Panel     Status: None   Collection Time: 11/01/19  2:10 PM   Specimen: BLOOD RIGHT ARM  Result Value Ref Range Status   Specimen Description BLOOD RIGHT ARM  Final   Special Requests   Final    BOTTLES DRAWN AEROBIC AND ANAEROBIC Blood Culture adequate volume   Culture   Final    NO GROWTH 5 DAYS Performed at [facility], [address], [place], [place] [address]    Report Status 11/06/2019 FINAL  Final  Surgical PCR screen     Status: None   Collection Time: 11/01/19  8:04 PM   Specimen: Nasal Mucosa; Nasal Swab  Result Value Ref Range Status   MRSA, PCR NEGATIVE NEGATIVE Final   Staphylococcus aureus NEGATIVE NEGATIVE Final    Comment: (NOTE) The Xpert SA Assay (FDA approved for NASAL specimens in patients 5 years of age and older), is one component of a comprehensive surveillance program. It is not intended to diagnose infection nor to guide or monitor treatment. Performed at [facility], [address] [place], [place] [address]   Aerobic/Anaerobic Culture (surgical/deep wound)     Status: None   Collection Time: 11/02/19 12:15 PM   Specimen: Foot, Left; Abscess  Result Value Ref Range Status   Specimen Description   Final    FOOT LEFT Performed at [facility], [address], [place], [place] [address]    Special Requests   Final    NONE Performed at [facility] [place], [place] [address]    Gram Stain NO WBC SEEN NO ORGANISMS SEEN   Final   Culture   Final    RARE STAPHYLOCOCCUS AUREUS RARE BACTEROIDES FRAGILIS BETA LACTAMASE POSITIVE Performed at [facility], [place] [address], [place], [place] [address]    Report Status 11/06/2019 FINAL  Final   Organism ID, Bacteria STAPHYLOCOCCUS AUREUS  Final      Susceptibility   Staphylococcus aureus - MIC*    CIPROFLOXACIN <=0.5 SENSITIVE Sensitive     ERYTHROMYCIN <=0.25 SENSITIVE Sensitive     GENTAMICIN <=0.5 SENSITIVE Sensitive     OXACILLIN 0.5 SENSITIVE Sensitive     TETRACYCLINE <=1 SENSITIVE Sensitive     VANCOMYCIN <=0.5 SENSITIVE Sensitive     TRIMETH/SULFA <=10 SENSITIVE Sensitive     CLINDAMYCIN <=0.25 SENSITIVE Sensitive     RIFAMPIN <=0.5 SENSITIVE Sensitive     Inducible Clindamycin NEGATIVE Sensitive     * RARE STAPHYLOCOCCUS  AUREUS  Anaerobic culture     Status: None    Collection Time: 11/02/19 12:16 PM   Specimen: Foot, Left; Tissue  Result Value Ref Range Status   Specimen Description   Final    FOOT LEFT Performed at [facility], [address] [place], [place] [address]    Special Requests   Final    NONE Performed at [facility], [address] [place], [place] [address]    Gram Stain   Final    RARE WBC PRESENT, PREDOMINANTLY PMN ABUNDANT GRAM POSITIVE COCCI MODERATE GRAM NEGATIVE RODS Performed at [facility], [place] [address] [place], [place] [address]    Culture   Final    MIXED ANAEROBIC FLORA PRESENT.  CALL LAB IF FURTHER IID REQUIRED.   Report Status 11/04/2019 FINAL  Final    RADIOLOGY:  [place] EKG SITE RITE  Result Date: 11/07/2019 If Site Rite image not attached, placement could not be confirmed due to current cardiac rhythm.     Management plans discussed with the patient, family (wife last night) and they are in agreement.  CODE STATUS:     Code Status Orders  (From admission, onward)         Start     Ordered   11/01/19 1330  Full code  Continuous        11/01/19 1330        Code Status History    Date Active Date Inactive Code Status Order ID Comments User Context   09/03/2019 1152 09/03/2019 1858 Full Code [id number]  Schnier, Dolores Lory, MD Inpatient   Advance Care Planning Activity      TOTAL TIME TAKING CARE OF THIS PATIENT: 31 minutes.    Loletha Grayer M.D on 11/09/2019 at 2:03 PM  Between 7am to 6pm - Pager - [phone]  After 6pm go to [email or web address] - password [email or web address]  [facility]  CC: Primary care physician; Juline Patch, MD

## 2019-11-11 ENCOUNTER — Telehealth: Payer: Self-pay

## 2019-11-11 DIAGNOSIS — L02612 Cutaneous abscess of left foot: Secondary | ICD-10-CM | POA: Diagnosis not present

## 2019-11-11 NOTE — Telephone Encounter (Signed)
Transition Care Management Follow-up Telephone Call  Date of discharge and from where: 11/09/19 [facility]  How have you been since you were released from the hospital? Pt states he has been doing okay   Any questions or concerns? Yes - pt concerned about blood sugar being too low in hospital per hospitalist and was told to d/c glipizide but patient is concerned about that and does not want to d/c. Pt has not been taking but wants to discuss with Dr. Yetta Barre.   Items Reviewed:  Did the pt receive and understand the discharge instructions provided? Yes   Medications obtained and verified? Yes   Other? No   Any new allergies since your discharge? No   Dietary orders reviewed? Yes  Do you have support at home? Yes   Home Care and Equipment/Supplies: Were home health services ordered? Yes - for teaching and assistance with PICC line If so, what is the name of the agency? [facility]  Has the agency set up a time to come to the patient's home? Yes - came on 11/09/19 Were any new equipment or medical supplies ordered?  No  Functional Questionnaire: (I = Independent and D = Dependent) ADLs: I with assistance  Bathing/Dressing- I  Meal Prep- D  Eating- I  Maintaining continence- I  Transferring/Ambulation- I with assistance  Managing Meds- I  Follow up appointments reviewed:   PCP Hospital f/u appt confirmed? Yes  Scheduled to see Dr. Yetta Barre on 11/14/19 @ 4:00.  Specialist Hospital f/u appt confirmed? Yes  Scheduled to see podiatry on 11/12/19.  Are transportation arrangements needed? No   If their condition worsens, is the pt aware to call PCP or go to the Emergency Dept.? Yes  Was the patient provided with contact information for the PCP's office or ED? Yes  Was to pt encouraged to call back with questions or concerns? Yes

## 2019-11-12 DIAGNOSIS — M86172 Other acute osteomyelitis, left ankle and foot: Secondary | ICD-10-CM | POA: Diagnosis not present

## 2019-11-12 DIAGNOSIS — E1140 Type 2 diabetes mellitus with diabetic neuropathy, unspecified: Secondary | ICD-10-CM | POA: Diagnosis not present

## 2019-11-14 ENCOUNTER — Ambulatory Visit (INDEPENDENT_AMBULATORY_CARE_PROVIDER_SITE_OTHER): Payer: PPO | Admitting: Family Medicine

## 2019-11-14 ENCOUNTER — Other Ambulatory Visit: Payer: Self-pay

## 2019-11-14 ENCOUNTER — Encounter: Payer: Self-pay | Admitting: Family Medicine

## 2019-11-14 VITALS — BP 110/80 | HR 68 | Ht 77.0 in | Wt 218.0 lb

## 2019-11-14 DIAGNOSIS — L02612 Cutaneous abscess of left foot: Secondary | ICD-10-CM | POA: Diagnosis not present

## 2019-11-14 DIAGNOSIS — L089 Local infection of the skin and subcutaneous tissue, unspecified: Secondary | ICD-10-CM | POA: Diagnosis not present

## 2019-11-14 DIAGNOSIS — E11628 Type 2 diabetes mellitus with other skin complications: Secondary | ICD-10-CM

## 2019-11-14 DIAGNOSIS — E1142 Type 2 diabetes mellitus with diabetic polyneuropathy: Secondary | ICD-10-CM

## 2019-11-14 DIAGNOSIS — Z09 Encounter for follow-up examination after completed treatment for conditions other than malignant neoplasm: Secondary | ICD-10-CM | POA: Diagnosis not present

## 2019-11-14 DIAGNOSIS — F5101 Primary insomnia: Secondary | ICD-10-CM

## 2019-11-14 DIAGNOSIS — I739 Peripheral vascular disease, unspecified: Secondary | ICD-10-CM | POA: Diagnosis not present

## 2019-11-14 MED ORDER — TRAZODONE HCL 50 MG PO TABS: 25.0000 mg | ORAL_TABLET | Freq: Every evening | ORAL | 5 refills | Status: DC | PRN

## 2019-11-14 NOTE — Patient Instructions (Signed)
Carbohydrate Counting for Diabetes Mellitus, Adult  Carbohydrate counting is a method of keeping track of how many carbohydrates you eat. Eating carbohydrates naturally increases the amount of sugar (glucose) in the blood. Counting how many carbohydrates you eat helps keep your blood glucose within normal limits, which helps you manage your diabetes (diabetes mellitus). It is important to know how many carbohydrates you can safely have in each meal. This is different for every person. A diet and nutrition specialist (registered dietitian) can help you make a meal plan and calculate how many carbohydrates you should have at each meal and snack. Carbohydrates are found in the following foods:  Grains, such as breads and cereals.  Dried beans and soy products.  Starchy vegetables, such as potatoes, peas, and corn.  Fruit and fruit juices.  Milk and yogurt.  Sweets and snack foods, such as cake, cookies, candy, chips, and soft drinks. How do I count carbohydrates? There are two ways to count carbohydrates in food. You can use either of the methods or a combination of both. Reading "Nutrition Facts" on packaged food The "Nutrition Facts" list is included on the labels of almost all packaged foods and beverages in the [place] It includes:  The serving size.  Information about nutrients in each serving, including the grams (g) of carbohydrate per serving. To use the "Nutrition Facts":  Decide how many servings you will have.  Multiply the number of servings by the number of carbohydrates per serving.  The resulting number is the total amount of carbohydrates that you will be having. Learning standard serving sizes of other foods When you eat carbohydrate foods that are not packaged or do not include "Nutrition Facts" on the label, you need to measure the servings in order to count the amount of carbohydrates:  Measure the foods that you will eat with a food scale or measuring cup, if  needed.  Decide how many standard-size servings you will eat.  Multiply the number of servings by 15. Most carbohydrate-rich foods have about 15 g of carbohydrates per serving. ? For example, if you eat 8 oz (170 g) of strawberries, you will have eaten 2 servings and 30 g of carbohydrates (2 servings x 15 g = 30 g).  For foods that have more than one food mixed, such as soups and casseroles, you must count the carbohydrates in each food that is included. The following list contains standard serving sizes of common carbohydrate-rich foods. Each of these servings has about 15 g of carbohydrates:   hamburger bun or  English muffin.   oz (15 mL) syrup.   oz (14 g) jelly.  1 slice of bread.  1 six-inch tortilla.  3 oz (85 g) cooked rice or pasta.  4 oz (113 g) cooked dried beans.  4 oz (113 g) starchy vegetable, such as peas, corn, or potatoes.  4 oz (113 g) hot cereal.  4 oz (113 g) mashed potatoes or  of a large baked potato.  4 oz (113 g) canned or frozen fruit.  4 oz (120 mL) fruit juice.  4-6 crackers.  6 chicken nuggets.  6 oz (170 g) unsweetened dry cereal.  6 oz (170 g) plain fat-free yogurt or yogurt sweetened with artificial sweeteners.  8 oz (240 mL) milk.  8 oz (170 g) fresh fruit or one small piece of fruit.  24 oz (680 g) popped popcorn. Example of carbohydrate counting Sample meal  3 oz (85 g) chicken breast.  6 oz (170 g)   brown rice.  4 oz (113 g) corn.  8 oz (240 mL) milk.  8 oz (170 g) strawberries with sugar-free whipped topping. Carbohydrate calculation 1. Identify the foods that contain carbohydrates: ? Rice. ? Corn. ? Milk. ? Strawberries. 2. Calculate how many servings you have of each food: ? 2 servings rice. ? 1 serving corn. ? 1 serving milk. ? 1 serving strawberries. 3. Multiply each number of servings by 15 g: ? 2 servings rice x 15 g = 30 g. ? 1 serving corn x 15 g = 15 g. ? 1 serving milk x 15 g = 15 g. ? 1  serving strawberries x 15 g = 15 g. 4. Add together all of the amounts to find the total grams of carbohydrates eaten: ? 30 g + 15 g + 15 g + 15 g = 75 g of carbohydrates total. Summary  Carbohydrate counting is a method of keeping track of how many carbohydrates you eat.  Eating carbohydrates naturally increases the amount of sugar (glucose) in the blood.  Counting how many carbohydrates you eat helps keep your blood glucose within normal limits, which helps you manage your diabetes.  A diet and nutrition specialist (registered dietitian) can help you make a meal plan and calculate how many carbohydrates you should have at each meal and snack. This information is not intended to replace advice given to you by your health care provider. Make sure you discuss any questions you have with your health care provider. Document Revised: 07/21/2016 Document Reviewed: 06/10/2015 Elsevier Patient Education  2020 [employer] GUIDELINES FOR  LOW-CHOLESTEROL, LOW-TRIGLYCERIDE DIETS    FOODS TO USE   MEATS, FISH Choose lean meats (chicken, turkey, veal, and non-fatty cuts of beef with excess fat trimmed; one serving = 3 oz of cooked meat). Also, fresh or frozen fish, canned fish packed in water, and shellfish (lobster, crabs, shrimp, and oysters). Limit use to no more than one serving of one of these per week. Shellfish are high in cholesterol but low in saturated fat and should be used sparingly. Meats and fish should be broiled (pan or oven) or baked on a rack.  EGGS Egg substitutes and egg whites (use freely). Egg yolks (limit two per week).  FRUITS Eat three servings of fresh fruit per day (1 serving =  cup). Be sure to have at least one citrus fruit daily. Frozen and canned fruit with no sugar or syrup added may be used.  VEGETABLES Most vegetables are not limited (see next page). One dark-green (string beans, escarole) or one deep yellow (squash) vegetable is recommended daily. Cauliflower,  broccoli, and celery, as well as potato skins, are recommended for their fiber content. (Fiber is associated with cholesterol reduction) It is preferable to steam vegetables, but they may be boiled, strained, or braised with polyunsaturated vegetable oil (see below).  BEANS Dried peas or beans (1 serving =  cup) may be used as a bread substitute.  NUTS Almonds, walnuts, and peanuts may be used sparingly  (1 serving = 1 Tablespoonful). Use pumpkin, sesame, or sunflower seeds.  BREADS, GRAINS One roll or one slice of whole grain or enriched bread may be used, or three soda crackers or four pieces of melba toast as a substitute. Spaghetti, rice or noodles ( cup) or  large ear of corn may be used as a bread substitute. In preparing these foods do not use butter or shortening, use soft margarine. Also use egg and sugar substitutes.  Choose high fiber   grains, such as oats and whole wheat.  CEREALS Use  cup of hot cereal or  cup of cold cereal per day. Add a sugar substitute if desired, with 99% fat free or skim milk.  MILK PRODUCTS Always use 99% fat free or skim milk, dairy products such as low fat cheeses (farmer's uncreamed diet cottage), low-fat yogurt, and powdered skim milk.  FATS, OILS Use soft (not stick) margarine; vegetable oils that are high in polyunsaturated fats (such as safflower, sunflower, soybean, corn, and cottonseed). Always refrigerate meat drippings to harden the fat and remove it before preparing gravies  DESSERTS, SNACKS Limit to two servings per day; substitute each serving for a bread/cereal serving: ice milk, water sherbet (1/4 cup); unflavored gelatin or gelatin flavored with sugar substitute (1/3 cup); pudding prepared with skim milk (1/2 cup); egg white souffls; unbuttered popcorn (1  cups). Substitute carob for chocolate.  BEVERAGES Fresh fruit juices (limit 4 oz per day); black coffee, plain or herbal teas; soft drinks with sugar substitutes; club soda, preferably salt-free;  cocoa made with skim milk or nonfat dried milk and water (sugar substitute added if desired); clear broth. Alcohol: limit two servings per day (see second page).  MISCELLANEOUS  You may use the following freely: vinegar, spices, herbs, nonfat bouillon, mustard, Worcestershire sauce, soy sauce, flavoring essence.                  GUIDELINES FOR  LOW-CHOLESTEROL, LOW TRIGLYCERIDE DIETS    FOODS TO AVOID   MEATS, FISH Marbled beef, pork, bacon, sausage, and other pork products; fatty fowl (duck, goose); skin and fat of turkey and chicken; processed meats; luncheon meats (salami, bologna); frankfurters and fast-food hamburgers (theyre loaded with fat); organ meats (kidneys, liver); canned fish packed in oil.  EGGS Limit egg yolks to two per week.   FRUITS Coconuts (rich in saturated fats).  VEGETABLES Avoid avocados. Starchy vegetables (potatoes, corn, lima beans, dried peas, beans) may be used only if substitutes for a serving of bread or cereal. (Baked potato skin, however, is desirable for its fiber content.  BEANS Commercial baked beans with sugar and/or pork added.  NUTS Avoid nuts.  Limit peanuts and walnuts to one tablespoonful per day.  BREADS, GRAINS Any baked goods with shortening and/or sugar. Commercial mixes with dried eggs and whole milk. Avoid sweet rolls, doughnuts, breakfast pastries (Danish), and sweetened packaged cereals (the added sugar converts readily to triglycerides).  MILK PRODUCTS Whole milk and whole-milk packaged goods; cream; ice cream; whole-milk puddings, yogurt, or cheeses; nondairy cream substitutes.  FATS, OILS Butter, lard, animal fats, bacon drippings, gravies, cream sauces as well as palm and coconut oils. All these are high in saturated fats. Examine labels on cholesterol free products for hydrogenated fats. (These are oils that have been hardened into solids and in the process have become saturated.)  DESSERTS, SNACKS Fried snack foods like potato  chips; chocolate; candies in general; jams, jellies, syrups; whole- milk puddings; ice cream and milk sherbets; hydrogenated peanut butter.  BEVERAGES Sugared fruit juices and soft drinks; cocoa made with whole milk and/or sugar. When using alcohol (1 oz liquor, 5 oz beer, or 2  oz dry table wine per serving), one serving must be substituted for one bread or cereal serving (limit, two servings of alcohol per day).   SPECIAL NOTES    7. Remember that even non-limited foods should be used in moderation. 8. While on a cholesterol-lowering diet, be sure to avoid animal fats and marbled meats.   9. 3. While on a triglyceride-lowering diet, be sure to avoid sweets and to control the amount of carbohydrates you eat (starchy foods such as flour, bread, potatoes).While on a tri-glyceride-lowering diet, be sure to avoid sweets 10. Buy a good low-fat cookbook, such as the one published by the American Heart Association. 11. Consult your physician if you have any questions.               [facility] Low Glycemic Diet Plan   Low Glycemic Foods (20-49) Moderate Glycemic Foods (50-69) High Glycemic Foods (70-100)      Breakfast Creals Breakfast Cereals Breakfast Cereals  All Bran All-Bran Fruit'n Oats   Bran Buds Bran Chex   Cheerios Corn chex    Fiber One Oatmeal (not instant)   Just Right Mini-Wheats   Corn Flakes Cream of Wheat    Oat Bran Special K Swiss Muesli   Grape Nuts Grape Nut Flakes      Grits Nutri-Grain    Fruits and fruit juice: Fruits Puffed Rice Puffed Wheat    (Limit to 1-2 Servings per day) Banana (under-ride) Dates   Rice Chex Rice Krispies    Apples Apricots (fresh/dried)   Figs Grapes   Shredded Wheat Team    Blackberries Blueberries   Kiwi Mango   Total     Cherries Cranberries   Oranges Raisins     Peaches Pears    Fruits  Plums Prunes   Fruit Juices Pineapple Watermelon    Grapefruit Raspberries   Cranberry Juice Orange Juice   Banana  (over-ripe)     Strawberries Tangerines      Apple Juice Grapefruit Juice   Beans and Legumes Beverages  Tomato Juice    [place]-type baked beans Sodas, sweet tea, pineapple juice   Canned pinto, kidney, or navy beans   Beans and Legumes (fresh-cooked) Green peas Vegetables  Black-eyed peas Butter Beans    Potato, baked, boiled, fried, mashed  Chick peas Lentils   Vegetables French fries  Green beans Lima beans   Beets Carrots   Canned or frozen corn  Kidney beans Navy beans   Sweet potato Yam   Parsnips  Pinto beans Snow peas   Corn on the cob Winter squash      Non-starchy vegetables Grains Breads  Asparagus, avocado, broccoli, cabbage Cornmeal Rice, brown   Most breads (white and whole grain)  cauliflower, celery, cucumber, greens Rice, white Couscous   Bagels Bread sticks    lettuce, mushrooms, peppers, tomatoes  Bread stuffing Kaiser roll    okra, onions, spinach, summer squash Pasta Dinner rolls   Macaroni Pizza, cheese     Grains Ravioli, meat filled Spaghetti, white   Grains  Barley Bulgur    Rice, instant Tapioca, with milk    Rye Wild rice   Nuts    Cashews Macadamia   Candy and most cookies  Nuts and oils    Almonds, peanuts, sunflower seeds Snacks Snacks  hazelnuts, pecans, walnuts Chocolate Ice cream, lowfat   Donuts Corn chips    Oils that are liquid at room temperature Muffin Popcorn   Jelly beans Pretzels      Pastries  Dairy, fish, meat, soy, and eggs    Milk, skim Lowfat cheese    Restaurant and ethnic foods  Yogurt, lowfat, fruit sugar sweetened  Most Chinese food (sugar in stir fry    or wok sauce)  Lean red meat Fish    Teriyaki-style meats and vegetables  Skinless chicken   and turkey, shellfish        Egg whites (up to 3 daily), Soy Products    Egg yolks (up to 7 or _____ per week)      

## 2019-11-14 NOTE — Progress Notes (Signed)
Date:  11/14/2019   Name:  Reginald Nielsen   DOB:  1960/09/19   MRN:  [id number]   Chief Complaint: Follow-up (foot)  Pt was recently admitted to [facility]  on 10/22 and was discharged on 10/30. Transition of care call placed on 11/1.    Lab Results  Component Value Date   CREATININE 0.81 11/09/2019   BUN 19 11/09/2019   NA 136 11/09/2019   K 3.9 11/09/2019   CL 103 11/09/2019   CO2 26 11/09/2019   Lab Results  Component Value Date   CHOL 197 05/20/2019   HDL 43 05/20/2019   LDLCALC 121 (H) 05/20/2019   TRIG 188 (H) 05/20/2019   No results found for: TSH Lab Results  Component Value Date   HGBA1C 5.4 11/06/2019   Lab Results  Component Value Date   WBC 7.4 11/09/2019   HGB 13.5 11/09/2019   HCT 40.9 11/09/2019   MCV 83.5 11/09/2019   PLT 254 11/09/2019   Lab Results  Component Value Date   ALT 14 11/01/2019   AST 13 (L) 11/01/2019   ALKPHOS 40 11/01/2019   BILITOT 0.9 11/01/2019     Review of Systems  Constitutional: Negative for chills and fever.  HENT: Negative for drooling, ear discharge, ear pain and sore throat.   Respiratory: Negative for cough, shortness of breath and wheezing.   Cardiovascular: Negative for chest pain, palpitations and leg swelling.  Gastrointestinal: Negative for abdominal pain, blood in stool, constipation, diarrhea and nausea.  Endocrine: Negative for polydipsia.  Genitourinary: Negative for dysuria, frequency, hematuria and urgency.  Musculoskeletal: Negative for back pain, myalgias and neck pain.  Skin: Negative for rash.  Allergic/Immunologic: Negative for environmental allergies.  Neurological: Negative for dizziness and headaches.  Hematological: Does not bruise/bleed easily.  Psychiatric/Behavioral: Negative for suicidal ideas. The patient is not nervous/anxious.     Patient Active Problem List   Diagnosis Date Noted  . Abscess of left foot   . Osteomyelitis ([place]) 11/02/2019  . Left foot infection 11/01/2019  .  DM (diabetes mellitus), type 2 with peripheral vascular complications ([place]) 44/92/0100  . PAD (peripheral artery disease) ([place]) 11/01/2019  . HLD (hyperlipidemia) 11/01/2019  . Hypertension   . Diabetic ulcer of left midfoot associated with type 2 diabetes mellitus, with necrosis of muscle ([place])   . Atherosclerosis of native arteries of the extremities with ulceration ([place]) 08/26/2019  . Diabetes ([place]) 08/26/2019  . DJD (degenerative joint disease) 08/26/2019  . Special screening for malignant neoplasms, colon     No Known Allergies  Past Surgical History:  Procedure Laterality Date  . COLONOSCOPY WITH PROPOFOL N/A 04/08/2019   Procedure: COLONOSCOPY WITH PROPOFOL;  Surgeon: Lucilla Lame, MD;  Location: [facility];  Service: Endoscopy;  Laterality: N/A;  . frozen shoulder    . IRRIGATION AND DEBRIDEMENT FOOT Left 11/02/2019   Procedure: IRRIGATION AND DEBRIDEMENT FOOT;  Surgeon: Sharlotte Alamo, DPM;  Location: [facility];  Service: Podiatry;  Laterality: Left;  . IRRIGATION AND DEBRIDEMENT FOOT Left 11/06/2019   Procedure: IRRIGATION AND DEBRIDEMENT FOOT;  Surgeon: Sharlotte Alamo, DPM;  Location: [facility];  Service: Podiatry;  Laterality: Left;  . LOWER EXTREMITY ANGIOGRAPHY Left 09/03/2019   Procedure: LOWER EXTREMITY ANGIOGRAPHY;  Surgeon: Katha Cabal, MD;  Location: [facility];  Service: Cardiovascular;  Laterality: Left;  . LOWER EXTREMITY ANGIOGRAPHY Left 11/05/2019   Procedure: Lower Extremity Angiography;  Surgeon: Katha Cabal, MD;  Location: [facility];  Service: Cardiovascular;  Laterality: Left;  . SHOULDER SURGERY     2015 and 2016    Social History   Tobacco Use  . Smoking status: Never Smoker  . Smokeless tobacco: Current User    Types: Chew  Vaping Use  . Vaping Use: Never used  Substance Use Topics  . Alcohol use: Not Currently    Comment: quit 11/2017  . Drug use: Not Currently     Medication list has been reviewed and  updated.  Current Meds  Medication Sig  . aspirin EC 81 MG tablet Take 81 mg by mouth daily.  Marland Kitchen atorvastatin (LIPITOR) 10 MG tablet Take 1 tablet (10 mg total) by mouth daily. Additional refills per the patient's primary care  . Blood Glucose Monitoring Suppl (ONE TOUCH ULTRA 2) w/Device KIT   . clopidogrel (PLAVIX) 75 MG tablet Take 1 tablet (75 mg total) by mouth daily.  . COMBIGAN 0.2-0.5 % ophthalmic solution Place 1 drop into both eyes 2 (two) times daily.   Marland Kitchen gabapentin (NEURONTIN) 100 MG capsule Take 1 capsule (100 mg total) by mouth 3 (three) times daily.  Marland Kitchen glipiZIDE (GLUCOTROL) 5 MG tablet Take 5 mg by mouth 2 (two) times daily before a meal.  . Lancets (ONETOUCH DELICA PLUS [id number]) MISC USE DAILY  . latanoprost (XALATAN) 0.005 % ophthalmic solution Place 1 drop into both eyes at bedtime.   . metFORMIN (GLUCOPHAGE) 500 MG tablet Take 1 tablet (500 mg total) by mouth 2 (two) times daily with a meal.  . metroNIDAZOLE (FLAGYL) 500 MG tablet Take 1 tablet (500 mg total) by mouth every 8 (eight) hours for 52 doses.  Glory Rosebush ULTRA test strip USE TO TEST BLOOD SUGAR ONCE DAILY  . oxyCODONE-acetaminophen (PERCOCET/ROXICET) 5-325 MG tablet Take 1 tablet by mouth every 6 (six) hours as needed for moderate pain or severe pain.    PHQ 2/9 Scores 11/01/2019 08/16/2019 05/22/2019 02/13/2019  PHQ - 2 Score 0 0 1 0  PHQ- 9 Score 0 3 6 0    GAD 7 : Generalized Anxiety Score 11/01/2019 08/16/2019 02/13/2019 12/31/2018  Nervous, Anxious, on Edge 0 1 0 1  Control/stop worrying 0 1 0 0  Worry too much - different things 0 1 0 0  Trouble relaxing 0 0 0 2  Restless 0 0 0 2  Easily annoyed or irritable 0 0 0 0  Afraid - awful might happen 0 1 0 0  Total GAD 7 Score 0 4 0 5  Anxiety Difficulty - Not difficult at all - Not difficult at all    BP Readings from Last 3 Encounters:  11/14/19 110/80  11/09/19 123/70  11/01/19 110/60    Physical Exam Vitals and nursing note reviewed.  HENT:      Head: Normocephalic.     Right Ear: Tympanic membrane, ear canal and external ear normal. There is no impacted cerumen.     Left Ear: Tympanic membrane, ear canal and external ear normal. There is no impacted cerumen.     Nose: Nose normal. No congestion or rhinorrhea.  Eyes:     General: No scleral icterus.       Right eye: No discharge.        Left eye: No discharge.     Conjunctiva/sclera: Conjunctivae normal.     Pupils: Pupils are equal, round, and reactive to light.  Neck:     Thyroid: No thyromegaly.     Vascular: No JVD.     Trachea: No  tracheal deviation.  Cardiovascular:     Rate and Rhythm: Normal rate and regular rhythm.     Heart sounds: Normal heart sounds. No murmur heard.  No friction rub. No gallop.   Pulmonary:     Effort: No respiratory distress.     Breath sounds: Normal breath sounds. No wheezing or rales.  Abdominal:     General: Bowel sounds are normal.     Palpations: Abdomen is soft. There is no mass.     Tenderness: There is no abdominal tenderness. There is no guarding or rebound.  Musculoskeletal:        General: No tenderness. Normal range of motion.     Cervical back: Normal range of motion and neck supple.  Lymphadenopathy:     Cervical: No cervical adenopathy.  Skin:    General: Skin is warm.     Findings: No rash.  Neurological:     Mental Status: He is alert and oriented to person, place, and time.     Cranial Nerves: No cranial nerve deficit.     Deep Tendon Reflexes: Reflexes are normal and symmetric.     Wt Readings from Last 3 Encounters:  11/14/19 218 lb (98.9 kg)  11/05/19 220 lb 0.3 oz (99.8 kg)  11/01/19 220 lb (99.8 kg)    BP 110/80   Pulse 68   Ht '6\' 5"'  (1.956 m)   Wt 218 lb (98.9 kg)   BMI 25.85 kg/m  [facility] Discharge Acute Issues Care Follow Up                                                                         Patient Demographics  Reginald Nielsen, is a 59 y.o. male  DOB 1960/05/22  MRN [id number].  Primary MD  Juline Patch, MD   Reason for TCC follow Up - diabetic management/dietary discussion   Past Medical History:  Diagnosis Date  . Diabetes mellitus without complication ([place])    type 2  . Glaucoma   . Hyperlipidemia   . Hypertension   . Shoulder pain, left     Past Surgical History:  Procedure Laterality Date  . COLONOSCOPY WITH PROPOFOL N/A 04/08/2019   Procedure: COLONOSCOPY WITH PROPOFOL;  Surgeon: Lucilla Lame, MD;  Location: [facility];  Service: Endoscopy;  Laterality: N/A;  . frozen shoulder    . IRRIGATION AND DEBRIDEMENT FOOT Left 11/02/2019   Procedure: IRRIGATION AND DEBRIDEMENT FOOT;  Surgeon: Sharlotte Alamo, DPM;  Location: [facility];  Service: Podiatry;  Laterality: Left;  . IRRIGATION AND DEBRIDEMENT FOOT Left 11/06/2019   Procedure: IRRIGATION AND DEBRIDEMENT FOOT;  Surgeon: Sharlotte Alamo, DPM;  Location: [facility];  Service: Podiatry;  Laterality: Left;  . LOWER EXTREMITY ANGIOGRAPHY Left 09/03/2019   Procedure: LOWER EXTREMITY ANGIOGRAPHY;  Surgeon: Katha Cabal, MD;  Location: [facility];  Service: Cardiovascular;  Laterality: Left;  . LOWER EXTREMITY ANGIOGRAPHY Left 11/05/2019   Procedure: Lower Extremity Angiography;  Surgeon: Katha Cabal, MD;  Location: [facility];  Service: Cardiovascular;  Laterality: Left;  . SHOULDER SURGERY     2015 and 2016       Recent HPI and Hospital Course  Patient recently admitted for osteomyelitis of his foot and further evaluation of his peripheral artery disease with angiography.  Patient has surgery to debride and culture as indicated infection for which he is on metronidazole and Ancef by injection.  [facility] Acute Care Issue to be followed in the Clinic   Patient is been taking well care of of by his spouse and is receiving his antibiotics on a regular basis.  He  is doing well with this diet but elected to resume his sulfonylurea which have encouraged him to back down to half and that dietary concerns were discussed encourage him to eat protein and that he can have some balancing to the carb.  Patient will be rechecked in 6 to 8 weeks as far as his diabetes and likely will check his cholesterol at that time.   Subjective:   Reginald Nielsen today has, No headache, No chest pain, No abdominal pain - No Nausea, No new weakness tingling or numbness, No Cough - SOB.  Assessment & Plan    1. Hospital discharge follow-up Patient is doing well status post hospital discharge with no significant complaints.  We reviewed his other medical issues as seen below.  2. Diabetic foot infection ([place]) Check of his right foot with the superficial is healing well without erythema without drainage redressed the area and patient will continue his usual good care.  3. Type 2 diabetes mellitus with diabetic polyneuropathy, without long-term current use of insulin (HCC) Chronic.  Controlled.  Stable.  Patient will continue Metformin 500 mg twice a day as well as glipizide as 2.5 to 5 mg pending his a.m. blood sugars.  4. PAD (peripheral artery disease) (HCC) Review of angiography by Dr. Delana Meyer indicates that there is some disease but he is doing well with no distal pain.  5. Primary insomnia New onset.  Stable.  Uncontrolled.  Patient is having some difficulty initiating sleep and would like to start trazodone which is what his wife is currently taking 25 to 50 mg as needed. - traZODone (DESYREL) 50 MG tablet; Take 0.5-1 tablets (25-50 mg total) by mouth at bedtime as needed for sleep.  Dispense: 30 tablet; Refill: 5    Reason for frequent admissions/ER visits **      Objective:   Vitals:   11/14/19 1554  BP: 110/80  Pulse: 68  Weight: 218 lb (98.9 kg)  Height: '6\' 5"'  (1.956 m)    Wt Readings from Last 3 Encounters:  11/14/19 218 lb (98.9 kg)  11/05/19 220 lb  0.3 oz (99.8 kg)  11/01/19 220 lb (99.8 kg)    Allergies as of 11/14/2019   No Known Allergies     Medication List       Accurate as of November 14, 2019  4:43 PM. If you have any questions, ask your nurse or doctor.        aspirin EC 81 MG tablet Take 81 mg by mouth daily.   atorvastatin 10 MG tablet Commonly known as: Lipitor Take 1 tablet (10 mg total) by mouth daily. Additional refills per the patient's primary care   clopidogrel 75 MG tablet Commonly known as: Plavix Take 1 tablet (75 mg total) by mouth daily.   Combigan 0.2-0.5 % ophthalmic solution Generic drug: brimonidine-timolol  Place 1 drop into both eyes 2 (two) times daily.   gabapentin 100 MG capsule Commonly known as: NEURONTIN Take 1 capsule (100 mg total) by mouth 3 (three) times daily.   glipiZIDE 5 MG tablet Commonly known as: GLUCOTROL Take 5 mg by mouth 2 (two) times daily before a meal.   latanoprost 0.005 % ophthalmic solution Commonly known as: XALATAN Place 1 drop into both eyes at bedtime.   metFORMIN 500 MG tablet Commonly known as: GLUCOPHAGE Take 1 tablet (500 mg total) by mouth 2 (two) times daily with a meal.   metroNIDAZOLE 500 MG tablet Commonly known as: FLAGYL Take 1 tablet (500 mg total) by mouth every 8 (eight) hours for 52 doses.   ONE TOUCH ULTRA 2 w/Device Kit   OneTouch Delica Plus [id number] Misc USE DAILY   OneTouch Ultra test strip Generic drug: glucose blood USE TO TEST BLOOD SUGAR ONCE DAILY   oxyCODONE-acetaminophen 5-325 MG tablet Commonly known as: PERCOCET/ROXICET Take 1 tablet by mouth every 6 (six) hours as needed for moderate pain or severe pain.   traZODone 50 MG tablet Commonly known as: DESYREL Take 0.5-1 tablets (25-50 mg total) by mouth at bedtime as needed for sleep. Started by: Otilio Miu, MD        Physical Exam: Constitutional: Patient appears well-developed and well-nourished. Not in obvious distress. HENT: Normocephalic,  atraumatic, External right and left ear normal. Oropharynx is clear and moist.  Eyes: Conjunctivae and EOM are normal. PERRLA, no scleral icterus. Neck: Normal ROM. Neck supple. No JVD. No tracheal deviation. No thyromegaly. CVS: RRR, S1/S2 +, no murmurs, no gallops, no carotid bruit.  Pulmonary: Effort and breath sounds normal, no stridor, rhonchi, wheezes, rales.  Abdominal: Soft. BS +, no distension, tenderness, rebound or guarding.  Musculoskeletal: Normal range of motion. No edema and no tenderness.  Lymphadenopathy: No lymphadenopathy noted, cervical, inguinal or axillary Neuro: Alert. Normal reflexes, muscle tone coordination. No cranial nerve deficit. Skin: Skin is warm and dry. No rash noted. Not diaphoretic. No erythema. No pallor. Psychiatric: Normal mood and affect. Behavior, judgment, thought content normal.   Data Review   Micro Results No results found for this or any previous visit (from the past 240 hour(s)).   CBC Recent Labs  Lab 11/09/19 0510  WBC 7.4  HGB 13.5  HCT 40.9  PLT 254  MCV 83.5  MCH 27.6  MCHC 33.0  RDW 12.9    Chemistries  Recent Labs  Lab 11/09/19 0510  NA 136  K 3.9  CL 103  CO2 26  GLUCOSE 144*  BUN 19  CREATININE 0.81  CALCIUM 8.9   ------------------------------------------------------------------------------------------------------------------ estimated creatinine clearance is 125.3 mL/min (by C-G formula based on SCr of 0.81 mg/dL). ------------------------------------------------------------------------------------------------------------------ No results for input(s): HGBA1C in the last 72 hours. ------------------------------------------------------------------------------------------------------------------ No results for input(s): CHOL, HDL, LDLCALC, TRIG, CHOLHDL, LDLDIRECT in the last 72 hours. ------------------------------------------------------------------------------------------------------------------ No results  for input(s): TSH, T4TOTAL, T3FREE, THYROIDAB in the last 72 hours.  Invalid input(s): FREET3 ------------------------------------------------------------------------------------------------------------------ No results for input(s): VITAMINB12, FOLATE, FERRITIN, TIBC, IRON, RETICCTPCT in the last 72 hours.  Coagulation profile No results for input(s): INR, PROTIME in the last 168 hours.  No results for input(s): DDIMER in the last 72 hours.  Cardiac Enzymes No results for input(s): CKMB, TROPONINI, MYOGLOBIN in the last 168 hours.  Invalid input(s): CK ------------------------------------------------------------------------------------------------------------------ Invalid input(s): POCBNP   Time Spent in minutes  [place] M.D on 11/14/2019 at 4:43 PM   **Disclaimer:  This note may have been dictated with voice recognition software. Similar sounding words can inadvertently be transcribed and this note may contain transcription errors which may not have been corrected upon publication of note.**  Assessment and Plan:

## 2019-11-18 DIAGNOSIS — L02612 Cutaneous abscess of left foot: Secondary | ICD-10-CM | POA: Diagnosis not present

## 2019-11-18 NOTE — Telephone Encounter (Signed)
Message received with update for home health information:  Patient is not seen by [facility] but he is receiving care from [facility] at [phone]. Contact is Sherrilyn Rist at [phone].

## 2019-11-19 DIAGNOSIS — L97511 Non-pressure chronic ulcer of other part of right foot limited to breakdown of skin: Secondary | ICD-10-CM | POA: Diagnosis not present

## 2019-11-19 DIAGNOSIS — E1140 Type 2 diabetes mellitus with diabetic neuropathy, unspecified: Secondary | ICD-10-CM | POA: Diagnosis not present

## 2019-11-21 DIAGNOSIS — L02612 Cutaneous abscess of left foot: Secondary | ICD-10-CM | POA: Diagnosis not present

## 2019-11-25 DIAGNOSIS — L02612 Cutaneous abscess of left foot: Secondary | ICD-10-CM | POA: Diagnosis not present

## 2019-11-26 DIAGNOSIS — M86172 Other acute osteomyelitis, left ankle and foot: Secondary | ICD-10-CM | POA: Diagnosis not present

## 2019-11-27 ENCOUNTER — Telehealth: Payer: Self-pay | Admitting: *Deleted

## 2019-11-27 NOTE — Chronic Care Management (AMB) (Signed)
  Chronic Care Management   Outreach Note  11/27/2019 Name: Jiovanni Heeter MRN: [id number] DOB: [birth year]  Kamaron Deskins is a 59 y.o. year old male who is a primary care patient of Duanne Limerick, MD. I reached out to Cheryll Cockayne by phone today in response to a referral sent by Mr. Nathin Saran Casamento's health plan.     An unsuccessful telephone outreach was attempted today. The patient was referred to the case management team for assistance with care management and care coordination.   Follow Up Plan: A HIPAA compliant phone message was left for the patient providing contact information and requesting a return call. The care management team will reach out to the patient again over the next 7 days. If patient returns call to provider office, please advise to call Embedded Care Management Care Guide Gwenevere Ghazi at [phone].  Gwenevere Ghazi  Care Guide, Embedded Care Coordination [facility] [place] Management  Direct Dial: [phone]

## 2019-11-28 DIAGNOSIS — L02612 Cutaneous abscess of left foot: Secondary | ICD-10-CM | POA: Diagnosis not present

## 2019-12-03 DIAGNOSIS — L02612 Cutaneous abscess of left foot: Secondary | ICD-10-CM | POA: Diagnosis not present

## 2019-12-04 NOTE — Chronic Care Management (AMB) (Signed)
  Chronic Care Management   Note  12/04/2019 Name: Lynard Postlewait MRN: [id number] DOB: [birth year]  Nathaneal Sommers is a 59 y.o. year old male who is a primary care patient of Juline Patch, MD. I reached out to [name] by phone today in response to a referral sent by Mr. Renzo Vincelette Garn's health plan.     Mr. Rossbach was given information about Chronic Care Management services today including:  1. CCM service includes personalized support from designated clinical staff supervised by his physician, including individualized plan of care and coordination with other care providers 2. 24/7 contact phone numbers for assistance for urgent and routine care needs. 3. Service will only be billed when office clinical staff spend 20 minutes or more in a month to coordinate care. 4. Only one practitioner may furnish and bill the service in a calendar month. 5. The patient may stop CCM services at any time (effective at the end of the month) by phone call to the office staff. 6. The patient will be responsible for cost sharing (co-pay) of up to 20% of the service fee (after annual deductible is met).  Patient agreed to services and verbal consent obtained.   Follow up plan: Telephone appointment with care management team member scheduled for:12/19/2019  [facility] Management

## 2019-12-05 DIAGNOSIS — L02612 Cutaneous abscess of left foot: Secondary | ICD-10-CM | POA: Diagnosis not present

## 2019-12-10 DIAGNOSIS — L02612 Cutaneous abscess of left foot: Secondary | ICD-10-CM | POA: Diagnosis not present

## 2019-12-11 DIAGNOSIS — L02612 Cutaneous abscess of left foot: Secondary | ICD-10-CM | POA: Diagnosis not present

## 2019-12-12 ENCOUNTER — Ambulatory Visit: Payer: PPO | Attending: Infectious Diseases | Admitting: Infectious Diseases

## 2019-12-12 ENCOUNTER — Ambulatory Visit: Payer: PPO

## 2019-12-12 ENCOUNTER — Encounter: Payer: Self-pay | Admitting: Infectious Diseases

## 2019-12-12 VITALS — BP 129/77 | HR 76 | Temp 97.9°F | Resp 16 | Ht 77.0 in | Wt 218.0 lb

## 2019-12-12 DIAGNOSIS — Z23 Encounter for immunization: Secondary | ICD-10-CM

## 2019-12-12 DIAGNOSIS — Z7982 Long term (current) use of aspirin: Secondary | ICD-10-CM | POA: Insufficient documentation

## 2019-12-12 DIAGNOSIS — E11628 Type 2 diabetes mellitus with other skin complications: Secondary | ICD-10-CM

## 2019-12-12 DIAGNOSIS — Z79899 Other long term (current) drug therapy: Secondary | ICD-10-CM | POA: Insufficient documentation

## 2019-12-12 DIAGNOSIS — Z7984 Long term (current) use of oral hypoglycemic drugs: Secondary | ICD-10-CM | POA: Diagnosis not present

## 2019-12-12 DIAGNOSIS — E785 Hyperlipidemia, unspecified: Secondary | ICD-10-CM | POA: Insufficient documentation

## 2019-12-12 DIAGNOSIS — B9561 Methicillin susceptible Staphylococcus aureus infection as the cause of diseases classified elsewhere: Secondary | ICD-10-CM | POA: Insufficient documentation

## 2019-12-12 DIAGNOSIS — E119 Type 2 diabetes mellitus without complications: Secondary | ICD-10-CM | POA: Insufficient documentation

## 2019-12-12 DIAGNOSIS — M8610 Other acute osteomyelitis, unspecified site: Secondary | ICD-10-CM | POA: Diagnosis not present

## 2019-12-12 DIAGNOSIS — I10 Essential (primary) hypertension: Secondary | ICD-10-CM | POA: Diagnosis not present

## 2019-12-12 DIAGNOSIS — E1151 Type 2 diabetes mellitus with diabetic peripheral angiopathy without gangrene: Secondary | ICD-10-CM | POA: Diagnosis not present

## 2019-12-12 DIAGNOSIS — L089 Local infection of the skin and subcutaneous tissue, unspecified: Secondary | ICD-10-CM | POA: Diagnosis not present

## 2019-12-12 MED ORDER — AMOXICILLIN-POT CLAVULANATE 875-125 MG PO TABS: 1.0000 | ORAL_TABLET | Freq: Two times a day (BID) | ORAL | 0 refills | Status: DC

## 2019-12-12 NOTE — Progress Notes (Signed)
NAME: Reginald Nielsen  DOB: 07-02-1960  MRN: [id number]  Date/Time: 12/12/2019 11:48 AM   Subjective:  Follow up visit for left foot infection ? Reginald Nielsen is a 59 y.o. here for follow up. Was recently in hospital between 11/01/19-11/09/19 for left foot infection and had I/D of abscess and removal of the seasomid bone on 11/02/19- HPE was acute osteomyelitis of the sesamoid bone- Culture was MSSa and rare bacteroides- He is getting 6 weeks of cefazoin and got 3 weeks of flagyl He had mepitel and stimulan rapid cure antibiotic beads. He is followed by Dr.Cline. last appt was lon 12/03/19 when he removed the beads and the mepitell Pt is here for follow up and he is doing okay Blood sugar under good control He is doing cefazolin IV three times a day and last date 12/4- He is 100% adherent No fever , chills, no rash, diarrhea Past Medical History:  Diagnosis Date  . Diabetes mellitus without complication ([place])    type 2  . Glaucoma   . Hyperlipidemia   . Hypertension   . Shoulder pain, left     Past Surgical History:  Procedure Laterality Date  . COLONOSCOPY WITH PROPOFOL N/A 04/08/2019   Procedure: COLONOSCOPY WITH PROPOFOL;  Surgeon: Lucilla Lame, MD;  Location: [place];  Service: Endoscopy;  Laterality: N/A;  . frozen shoulder    . IRRIGATION AND DEBRIDEMENT FOOT Left 11/02/2019   Procedure: IRRIGATION AND DEBRIDEMENT FOOT;  Surgeon: Sharlotte Alamo, DPM;  Location: [facility];  Service: Podiatry;  Laterality: Left;  . IRRIGATION AND DEBRIDEMENT FOOT Left 11/06/2019   Procedure: IRRIGATION AND DEBRIDEMENT FOOT;  Surgeon: Sharlotte Alamo, DPM;  Location: [facility];  Service: Podiatry;  Laterality: Left;  . LOWER EXTREMITY ANGIOGRAPHY Left 09/03/2019   Procedure: LOWER EXTREMITY ANGIOGRAPHY;  Surgeon: Katha Cabal, MD;  Location: [facility];  Service: Cardiovascular;  Laterality: Left;  . LOWER EXTREMITY ANGIOGRAPHY Left 11/05/2019   Procedure: Lower Extremity  Angiography;  Surgeon: Katha Cabal, MD;  Location: [facility];  Service: Cardiovascular;  Laterality: Left;  . SHOULDER SURGERY     2015 and 2016    Social History   Socioeconomic History  . Marital status: Married    Spouse name: Not on file  . Number of children: 1  . Years of education: Not on file  . Highest education level: High school graduate  Occupational History  . Occupation: retired    Comment: truck Geophysicist/field seismologist  Tobacco Use  . Smoking status: Never Smoker  . Smokeless tobacco: Current User    Types: Chew  Vaping Use  . Vaping Use: Never used  Substance and Sexual Activity  . Alcohol use: Not Currently    Comment: quit 11/2017  . Drug use: Not Currently  . Sexual activity: Not Currently  Other Topics Concern  . Not on file  Social History Narrative  . Not on file   Social Determinants of Health   Financial Resource Strain: Low Risk   . Difficulty of Paying Living Expenses: Not very hard  Food Insecurity: No Food Insecurity  . Worried About Charity fundraiser in the Last Year: Never true  . Ran Out of Food in the Last Year: Never true  Transportation Needs: No Transportation Needs  . Lack of Transportation (Medical): No  . Lack of Transportation (Non-Medical): No  Physical Activity: Insufficiently Active  . Days of Exercise per Week: 4 days  . Minutes of Exercise per Session:  30 min  Stress: No Stress Concern Present  . Feeling of Stress : Only a little  Social Connections: Unknown  . Frequency of Communication with Friends and Family: Patient refused  . Frequency of Social Gatherings with Friends and Family: Patient refused  . Attends Religious Services: Patient refused  . Active Member of Clubs or Organizations: Patient refused  . Attends Archivist Meetings: Patient refused  . Marital Status: Married  Human resources officer Violence: Not At Risk  . Fear of Current or Ex-Partner: No  . Emotionally Abused: No  . Physically Abused: No   . Sexually Abused: No    Family History  Problem Relation Age of Onset  . Diabetes Father   . Cancer Father        lung  . COPD Father   . Heart attack Father   . Stroke Father   . Diabetes Paternal Aunt    No Known Allergies  ? Current Outpatient Medications  Medication Sig Dispense Refill  . aspirin EC 81 MG tablet Take 81 mg by mouth daily.    Marland Kitchen atorvastatin (LIPITOR) 10 MG tablet Take 1 tablet (10 mg total) by mouth daily. Additional refills per the patient's primary care 30 tablet 6  . Blood Glucose Monitoring Suppl (ONE TOUCH ULTRA 2) w/Device KIT     . ceFAZolin (ANCEF) 10 g injection     . clopidogrel (PLAVIX) 75 MG tablet Take 1 tablet (75 mg total) by mouth daily. 30 tablet 4  . COMBIGAN 0.2-0.5 % ophthalmic solution Place 1 drop into both eyes 2 (two) times daily.     Marland Kitchen gabapentin (NEURONTIN) 100 MG capsule Take 1 capsule (100 mg total) by mouth 3 (three) times daily. 90 capsule 0  . glipiZIDE (GLUCOTROL) 5 MG tablet Take 5 mg by mouth 2 (two) times daily before a meal.    . Lancets (ONETOUCH DELICA PLUS [id number]) MISC USE DAILY 100 each 0  . latanoprost (XALATAN) 0.005 % ophthalmic solution Place 1 drop into both eyes at bedtime.     . metFORMIN (GLUCOPHAGE) 500 MG tablet Take 1 tablet (500 mg total) by mouth 2 (two) times daily with a meal. 180 tablet 1  . ONETOUCH ULTRA test strip USE TO TEST BLOOD SUGAR ONCE DAILY 100 strip 0  . traZODone (DESYREL) 50 MG tablet Take 0.5-1 tablets (25-50 mg total) by mouth at bedtime as needed for sleep. 30 tablet 5   No current facility-administered medications for this visit.     Abtx:  Anti-infectives (From admission, onward)   None      REVIEW OF SYSTEMS:  Const: negative fever, negative chills, negative weight loss Eyes: negative diplopia or visual changes, negative eye pain ENT: negative coryza, negative sore throat Resp: negative cough, hemoptysis, dyspnea Cards: negative for chest pain, palpitations, lower  extremity edema GU: negative for frequency, dysuria and hematuria GI: Negative for abdominal pain, diarrhea, bleeding, constipation Skin: negative for rash and pruritus Heme: negative for easy bruising and gum/nose bleeding MS: care ful about bearing weight left foot Neurolo:negative for headaches, dizziness, vertigo, memory problems  Psych: negative for feelings of anxiety, depression  Endocrine: dm Allergy/Immunology- negative for any medication or food allergies  Objective:  VITALS:  BP 129/77   Pulse 76   Temp 97.9 F (36.6 C)   Resp 16   Ht 6' 5" (1.956 m)   Wt 218 lb (98.9 kg)   SpO2 98%   BMI 25.85 kg/m  PHYSICAL EXAM:  General: Alert, cooperative,  no distress, appears stated age.  Head: Normocephalic, without obvious abnormality, atraumatic. Eyes: Conjunctivae clear, anicteric sclerae. Pupils are equal ENT did not examine- has a mask Neck: Supple, symmetrical, no adenopathy, thyroid: non tender no carotid bruit and no JVD. Back: No CVA tenderness. Lungs: Clear to auscultation bilaterally. No Wheezing or Rhonchi. No rales. Heart: Regular rate and rhythm, no murmur, rub or gallop. Abdomen: Soft, non-tender,not distended. Bowel sounds normal. No masses Extremities: rt picc site clean Left foot Open wound- tendon exposed at the MTP great toe plantar surface. Some maceration around       atraumatic, no cyanosis. No edema. No clubbing Skin: No rashes or lesions. Or bruising Lymph: Cervical, supraclavicular normal. Neurologic: Grossly non-focal Pertinent Labs Lab Results 12/10/19 WBC 5.7, HB 14, PLT 140 ESR, CMP pending ? Impression/Recommendation ? ?Diabetic foot infection of the left foot  With underlying PAD Underwent debridement of wound and remoal of sesamoid- acute osteomyelitis- MSSa, anerobe cutured- will complete 6 weeks of IV cefazolin on 12/14/19 Nurse will remove PICC after  Took 3 weeks of metronidazole The wound is open exposing tendon Will  give augmentin for 2 more weeks Will discuss with podiatist SDM- says Hba1c is better controlled and < 6  DM on metformin- says he follows a keto diet  PAD-had angio  Hyperlipidemia on statin He asked for Moderna Booster and got 50[employer] Im today Discussed with patient and infusion company ?follow up 2 weeks ___________________________________________________

## 2019-12-12 NOTE — Patient Instructions (Signed)
You are here for follow up of left foot infection- you will complete IV cefazolin tomorrow and the PICC will be removed after that. I am sending prescription for augmentin 875 mg ( also called amox/clav) taken 1 tablet twice a day  for 2 weeks to your pharmacy . Will see you back in 2 weeks

## 2019-12-12 NOTE — Progress Notes (Signed)
   Covid-19 Vaccination Clinic  Name:  Reginald Nielsen    MRN: [id number] DOB: 29-Aug-1960  12/12/2019  [name] was observed post Covid-19 immunization for 15 minutes  without incident. He was provided with Vaccine Information Sheet and instruction to access the V-Safe system.   [name] was instructed to call 911 with any severe reactions post vaccine: Marland Kitchen Difficulty breathing  . Swelling of face and throat  . A fast heartbeat  . A bad rash all over body  . Dizziness and weakness   Immunizations Administered    No immunizations on file.     Christipher Rieger cma

## 2019-12-16 DIAGNOSIS — L02612 Cutaneous abscess of left foot: Secondary | ICD-10-CM | POA: Diagnosis not present

## 2019-12-17 DIAGNOSIS — M86172 Other acute osteomyelitis, left ankle and foot: Secondary | ICD-10-CM | POA: Diagnosis not present

## 2019-12-18 ENCOUNTER — Telehealth: Payer: Self-pay | Admitting: Pharmacist

## 2019-12-18 DIAGNOSIS — I87312 Chronic venous hypertension (idiopathic) with ulcer of left lower extremity: Secondary | ICD-10-CM | POA: Diagnosis not present

## 2019-12-18 NOTE — Chronic Care Management (AMB) (Signed)
° ° °  Chronic Care Management Pharmacy Assistant   Name: Reginald Nielsen  MRN: [id number] DOB: [birth year]  Reason for Encounter: (Attempted) General Adherence Assessment   PCP : Juline Patch, MD  Allergies:  No Known Allergies  Medications: Outpatient Encounter Medications as of 12/18/2019  Medication Sig   amoxicillin-clavulanate (AUGMENTIN) 875-125 MG tablet Take 1 tablet by mouth 2 (two) times daily.   aspirin EC 81 MG tablet Take 81 mg by mouth daily.   atorvastatin (LIPITOR) 10 MG tablet Take 1 tablet (10 mg total) by mouth daily. Additional refills per the patient's primary care   Blood Glucose Monitoring Suppl (ONE TOUCH ULTRA 2) w/Device KIT    clopidogrel (PLAVIX) 75 MG tablet Take 1 tablet (75 mg total) by mouth daily.   COMBIGAN 0.2-0.5 % ophthalmic solution Place 1 drop into both eyes 2 (two) times daily.    gabapentin (NEURONTIN) 100 MG capsule Take 1 capsule (100 mg total) by mouth 3 (three) times daily.   glipiZIDE (GLUCOTROL) 5 MG tablet Take 5 mg by mouth 2 (two) times daily before a meal.   Lancets (ONETOUCH DELICA PLUS [id number]) MISC USE DAILY   latanoprost (XALATAN) 0.005 % ophthalmic solution Place 1 drop into both eyes at bedtime.    metFORMIN (GLUCOPHAGE) 500 MG tablet Take 1 tablet (500 mg total) by mouth 2 (two) times daily with a meal.   ONETOUCH ULTRA test strip USE TO TEST BLOOD SUGAR ONCE DAILY   traZODone (DESYREL) 50 MG tablet Take 0.5-1 tablets (25-50 mg total) by mouth at bedtime as needed for sleep.   No facility-administered encounter medications on file as of 12/18/2019.    Current Diagnosis: Patient Active Problem List   Diagnosis Date Noted   Abscess of left foot    Osteomyelitis ([place]) 11/02/2019   Left foot infection 11/01/2019   DM (diabetes mellitus), type 2 with peripheral vascular complications ([place]) 04/79/9872   PAD (peripheral artery disease) ([place]) 11/01/2019   HLD (hyperlipidemia) 11/01/2019   Hypertension     Diabetic ulcer of left midfoot associated with type 2 diabetes mellitus, with necrosis of muscle (H[facility])    Atherosclerosis of native arteries of the extremities with ulceration ([place]) 08/26/2019   Diabetes ([place]) 08/26/2019   DJD (degenerative joint disease) 08/26/2019   Special screening for malignant neoplasms, colon     12/18/19- CPA attempted to call the patient to perform a general assessment. No answer; left a HIPAA compliant Voicemail for a returned phone call.   Raynelle Highland, [place] Assistant [phone]  Follow-Up:  Pharmacist Review

## 2019-12-19 ENCOUNTER — Telehealth: Payer: PPO

## 2019-12-19 NOTE — Chronic Care Management (AMB) (Deleted)
Chronic Care Management Pharmacy  Name: Reginald Nielsen  MRN: [id number] DOB: October 15, 1960   Chief Complaint/ HPI  Reginald Nielsen,  59 y.o. , male presents for his Initial CCM visit with the clinical pharmacist via telephone.  PCP : Juline Patch, MD Patient Care Team: Juline Patch, MD as PCP - General (Family Medicine) Vladimir Faster, [facility] (Pharmacist)  Patient's chronic conditions include: Hypertension, Hyperlipidemia, Diabetes and DDD, PAD with DM ulcer   Office Visits: 11/14/19- Dr. Ronnald Ramp- hospital d/c follow up-trazodone 25-50 mg qhs [facility] 10/22-10/30 osteomyelitis debridement & culture of wound  Consult Visit: 10/09/19- 09/03/19- Dr. Delana Meyer, Cards- LLE angiography ASO  No Known Allergies  Medications: Outpatient Encounter Medications as of 12/19/2019  Medication Sig  . amoxicillin-clavulanate (AUGMENTIN) 875-125 MG tablet Take 1 tablet by mouth 2 (two) times daily.  Marland Kitchen aspirin EC 81 MG tablet Take 81 mg by mouth daily.  Marland Kitchen atorvastatin (LIPITOR) 10 MG tablet Take 1 tablet (10 mg total) by mouth daily. Additional refills per the patient's primary care  . Blood Glucose Monitoring Suppl (ONE TOUCH ULTRA 2) w/Device KIT   . clopidogrel (PLAVIX) 75 MG tablet Take 1 tablet (75 mg total) by mouth daily.  . COMBIGAN 0.2-0.5 % ophthalmic solution Place 1 drop into both eyes 2 (two) times daily.   Marland Kitchen gabapentin (NEURONTIN) 100 MG capsule Take 1 capsule (100 mg total) by mouth 3 (three) times daily.  Marland Kitchen glipiZIDE (GLUCOTROL) 5 MG tablet Take 5 mg by mouth 2 (two) times daily before a meal.  . Lancets (ONETOUCH DELICA PLUS [id number]) MISC USE DAILY  . latanoprost (XALATAN) 0.005 % ophthalmic solution Place 1 drop into both eyes at bedtime.   . metFORMIN (GLUCOPHAGE) 500 MG tablet Take 1 tablet (500 mg total) by mouth 2 (two) times daily with a meal.  . ONETOUCH ULTRA test strip USE TO TEST BLOOD SUGAR ONCE DAILY  . traZODone (DESYREL) 50 MG tablet Take 0.5-1 tablets (25-50 mg  total) by mouth at bedtime as needed for sleep.   No facility-administered encounter medications on file as of 12/19/2019.    Wt Readings from Last 3 Encounters:  12/12/19 218 lb (98.9 kg)  11/14/19 218 lb (98.9 kg)  11/05/19 220 lb 0.3 oz (99.8 kg)    Current Diagnosis/Assessment:    Goals Addressed   None     Diabetes   A1c goal {A1c goals:23924}  Recent Relevant Labs: Lab Results  Component Value Date/Time   HGBA1C 5.4 11/06/2019 04:19 AM   HGBA1C 5.3 08/16/2019 11:24 AM    Last diabetic Eye exam: No results found for: HMDIABEYEEXA  Last diabetic Foot exam: No results found for: HMDIABFOOTEX   Checking BG: {CHL HP Blood Glucose Monitoring Frequency:[phone]}  Recent FBG Readings: *** Recent pre-meal BG readings: *** Recent 2hr PP BG readings:  *** Recent HS BG readings: ***  Patient has failed these meds in past: *** Patient is currently {CHL Controlled/[phone]} on the following medications: Marland Kitchen Glipizide 5 mg bid ac meal . Metformin 1000 mg bid  We discussed: {CHL HP Upstream Pharmacy discussion:[phone]}  Plan  Continue {CHL HP Upstream Pharmacy Plans:[id number]}  Hypertension   BP goal is:  {CHL HP UPSTREAM Pharmacist BP ranges:[phone]}  Office blood pressures are  BP Readings from Last 3 Encounters:  12/12/19 129/77  11/14/19 110/80  11/09/19 123/70   Patient checks BP at home {CHL HP BP Monitoring [phone]} Patient home BP readings are ranging: ***  Patient has failed these meds in the  past: *** Patient is currently {CHL Controlled/Uncontrolled:2109141014} on the following medications:  . ***  We discussed {CHL HP Upstream Pharmacy discussion:[id number]}  Plan  Continue {CHL HP Upstream Pharmacy Plans:2109141003}     Hyperlipidemia   LDL goal < ***  Last lipids Lab Results  Component Value Date   CHOL 197 05/20/2019   HDL 43 05/20/2019   LDLCALC 121 (H) 05/20/2019   TRIG 188 (H) 05/20/2019    Hepatic Function Latest Ref Rng & Units 11/01/2019 02/13/2019  Total Protein 6.5 - 8.1 g/dL 8.1 -  Albumin 3.5 - 5.0 g/dL 3.8 4.5  AST 15 - 41 U/L 13(L) -  ALT 0 - 44 U/L 14 -  Alk Phosphatase 38 - 126 U/L 40 -  Total Bilirubin 0.3 - 1.2 mg/dL 0.9 -     The 10-year ASCVD risk score (Goff DC Jr., et al., 2013) is: 17.6%   Values used to calculate the score:     Age: 59 years     Sex: Male     Is Non-Hispanic African American: No     Diabetic: Yes     Tobacco smoker: No     Systolic Blood Pressure: 129 mmHg     Is BP treated: Yes     HDL Cholesterol: 43 mg/dL     Total Cholesterol: 197 mg/dL   Patient has failed these meds in past: *** Patient is currently {CHL Controlled/Uncontrolled:2109141014} on the following medications:  . atorvasatin 10 mg qd . Clopidogrel 75 mg qd  We discussed:  {CHL HP Upstream Pharmacy discussion:[id number]}  Plan  Continue {CHL HP Upstream Pharmacy Plans:2109141003}    Medication Management   Patient's preferred pharmacy is:  [id number] - [place], [place] - [address] [place] [address] Phone: [phone] Fax: [phone]  [id number] - [place], [place] - [address] [address] [place] [address] Phone: [phone] Fax: [phone]  Uses pill box? {Yes or If no, why not?:20788} Pt endorses ***% compliance  We discussed: {Pharmacy options:24294}  Plan  {US Pharmacy Plan:23885}    Follow up: *** month phone visit  ***   

## 2019-12-26 ENCOUNTER — Other Ambulatory Visit: Payer: Self-pay

## 2019-12-26 ENCOUNTER — Ambulatory Visit: Payer: PPO | Admitting: Family Medicine

## 2019-12-26 ENCOUNTER — Encounter: Payer: Self-pay | Admitting: Infectious Diseases

## 2019-12-26 ENCOUNTER — Ambulatory Visit: Payer: PPO | Attending: Infectious Diseases | Admitting: Infectious Diseases

## 2019-12-26 VITALS — BP 127/81 | HR 67 | Temp 97.2°F | Resp 16 | Ht 77.0 in | Wt 218.0 lb

## 2019-12-26 DIAGNOSIS — L089 Local infection of the skin and subcutaneous tissue, unspecified: Secondary | ICD-10-CM

## 2019-12-26 DIAGNOSIS — Z7984 Long term (current) use of oral hypoglycemic drugs: Secondary | ICD-10-CM | POA: Diagnosis not present

## 2019-12-26 DIAGNOSIS — M86172 Other acute osteomyelitis, left ankle and foot: Secondary | ICD-10-CM | POA: Insufficient documentation

## 2019-12-26 DIAGNOSIS — Z79899 Other long term (current) drug therapy: Secondary | ICD-10-CM | POA: Insufficient documentation

## 2019-12-26 DIAGNOSIS — E11628 Type 2 diabetes mellitus with other skin complications: Secondary | ICD-10-CM

## 2019-12-26 DIAGNOSIS — Z7982 Long term (current) use of aspirin: Secondary | ICD-10-CM | POA: Diagnosis not present

## 2019-12-26 DIAGNOSIS — E1151 Type 2 diabetes mellitus with diabetic peripheral angiopathy without gangrene: Secondary | ICD-10-CM | POA: Insufficient documentation

## 2019-12-26 DIAGNOSIS — E785 Hyperlipidemia, unspecified: Secondary | ICD-10-CM | POA: Insufficient documentation

## 2019-12-26 NOTE — Progress Notes (Signed)
NAME: Reginald Nielsen  DOB: [birth year]  MRN: [id number]  Date/Time: 12/26/2019 10:34 AM   Subjective:  Follow up visit for left foot infection ? Reginald Nielsen is a 59 y.o. here for follow up. Last saw him 2 weeks ago .  Was recently in hospital between 11/01/19-11/09/19 for left foot infection and had I/D of abscess and removal of the seasomid bone on 11/02/19- HPE was acute osteomyelitis of the sesamoid bone- Culture was MSSa and rare bacteroides- He got  6 weeks of cefazoin ( 12/14/19) and t 3 weeks of flagyl. He was started on augmentin on 12/15/19 He had mepitel and stimulan rapid cure antibiotic beads. He is followed by Dr.Cline. last appt was on 12/17/19  Pt is here for follow up and he is doing okay Blood sugar under good control  No fever , chills, no rash, diarrhea Past Medical History:  Diagnosis Date  . Diabetes mellitus without complication ([place])    type 2  . Glaucoma   . Hyperlipidemia   . Hypertension   . Shoulder pain, left     Past Surgical History:  Procedure Laterality Date  . COLONOSCOPY WITH PROPOFOL N/A 04/08/2019   Procedure: COLONOSCOPY WITH PROPOFOL;  Surgeon: Lucilla Lame, MD;  Location: [place];  Service: Endoscopy;  Laterality: N/A;  . frozen shoulder    . IRRIGATION AND DEBRIDEMENT FOOT Left 11/02/2019   Procedure: IRRIGATION AND DEBRIDEMENT FOOT;  Surgeon: Sharlotte Alamo, DPM;  Location: [facility];  Service: Podiatry;  Laterality: Left;  . IRRIGATION AND DEBRIDEMENT FOOT Left 11/06/2019   Procedure: IRRIGATION AND DEBRIDEMENT FOOT;  Surgeon: Sharlotte Alamo, DPM;  Location: [facility];  Service: Podiatry;  Laterality: Left;  . LOWER EXTREMITY ANGIOGRAPHY Left 09/03/2019   Procedure: LOWER EXTREMITY ANGIOGRAPHY;  Surgeon: Katha Cabal, MD;  Location: [place] [facility];  Service: Cardiovascular;  Laterality: Left;  . LOWER EXTREMITY ANGIOGRAPHY Left 11/05/2019   Procedure: Lower Extremity Angiography;  Surgeon: Katha Cabal, MD;   Location: [place] [facility];  Service: Cardiovascular;  Laterality: Left;  . SHOULDER SURGERY     2015 and 2016    Social History   Socioeconomic History  . Marital status: Married    Spouse name: Not on file  . Number of children: 1  . Years of education: Not on file  . Highest education level: High school graduate  Occupational History  . Occupation: retired    Comment: truck Geophysicist/field seismologist  Tobacco Use  . Smoking status: Never Smoker  . Smokeless tobacco: Current User    Types: Chew  Vaping Use  . Vaping Use: Never used  Substance and Sexual Activity  . Alcohol use: Not Currently    Comment: quit 11/2017  . Drug use: Not Currently  . Sexual activity: Not Currently  Other Topics Concern  . Not on file  Social History Narrative  . Not on file   Social Determinants of Health   Financial Resource Strain: Low Risk   . Difficulty of Paying Living Expenses: Not very hard  Food Insecurity: No Food Insecurity  . Worried About Charity fundraiser in the Last Year: Never true  . Ran Out of Food in the Last Year: Never true  Transportation Needs: No Transportation Needs  . Lack of Transportation (Medical): No  . Lack of Transportation (Non-Medical): No  Physical Activity: Insufficiently Active  . Days of Exercise per Week: 4 days  . Minutes of Exercise per Session: 30 min  Stress: No Stress  Concern Present  . Feeling of Stress : Only a little  Social Connections: Unknown  . Frequency of Communication with Friends and Family: Patient refused  . Frequency of Social Gatherings with Friends and Family: Patient refused  . Attends Religious Services: Patient refused  . Active Member of Clubs or Organizations: Patient refused  . Attends Archivist Meetings: Patient refused  . Marital Status: Married  Human resources officer Violence: Not At Risk  . Fear of Current or Ex-Partner: No  . Emotionally Abused: No  . Physically Abused: No  . Sexually Abused: No    Family History   Problem Relation Age of Onset  . Diabetes Father   . Cancer Father        lung  . COPD Father   . Heart attack Father   . Stroke Father   . Diabetes Paternal Aunt    No Known Allergies  ? Current Outpatient Medications  Medication Sig Dispense Refill  . amoxicillin-clavulanate (AUGMENTIN) 875-125 MG tablet Take 1 tablet by mouth 2 (two) times daily. 28 tablet 0  . aspirin EC 81 MG tablet Take 81 mg by mouth daily.    Marland Kitchen atorvastatin (LIPITOR) 10 MG tablet Take 1 tablet (10 mg total) by mouth daily. Additional refills per the patient's primary care 30 tablet 6  . Blood Glucose Monitoring Suppl (ONE TOUCH ULTRA 2) w/Device KIT     . clopidogrel (PLAVIX) 75 MG tablet Take 1 tablet (75 mg total) by mouth daily. 30 tablet 4  . COMBIGAN 0.2-0.5 % ophthalmic solution Place 1 drop into both eyes 2 (two) times daily.     Marland Kitchen gabapentin (NEURONTIN) 100 MG capsule Take 1 capsule (100 mg total) by mouth 3 (three) times daily. 90 capsule 0  . glipiZIDE (GLUCOTROL) 5 MG tablet Take 5 mg by mouth 2 (two) times daily before a meal.    . Lancets (ONETOUCH DELICA PLUS [id number]) MISC USE DAILY 100 each 0  . latanoprost (XALATAN) 0.005 % ophthalmic solution Place 1 drop into both eyes at bedtime.     . metFORMIN (GLUCOPHAGE) 500 MG tablet Take 1 tablet (500 mg total) by mouth 2 (two) times daily with a meal. 180 tablet 1  . ONETOUCH ULTRA test strip USE TO TEST BLOOD SUGAR ONCE DAILY 100 strip 0  . traZODone (DESYREL) 50 MG tablet Take 0.5-1 tablets (25-50 mg total) by mouth at bedtime as needed for sleep. 30 tablet 5   No current facility-administered medications for this visit.     Abtx:  Anti-infectives (From admission, onward)   None      REVIEW OF SYSTEMS:  Const: negative fever, negative chills, negative weight loss Eyes: negative diplopia or visual changes, negative eye pain ENT: negative coryza, negative sore throat Resp: negative cough, hemoptysis, dyspnea Cards: negative for chest  pain, palpitations, lower extremity edema GU: negative for frequency, dysuria and hematuria GI: Negative for abdominal pain, diarrhea, bleeding, constipation Skin: negative for rash and pruritus Heme: negative for easy bruising and gum/nose bleeding MS: says left hip hurting him since he has to walk on his left heel Neurolo:negative for headaches, dizziness, vertigo, memory problems  Psych: negative for feelings of anxiety, depression  Endocrine: dm Allergy/Immunology- negative for any medication or food allergies  Objective:  VITALS:  BP 127/81   Pulse 67   Temp (!) 97.2 F (36.2 C)   Resp 16   Ht '6\' 5"'  (1.956 m)   Wt 218 lb (98.9 kg)   SpO2 98%  BMI 25.85 kg/m   PHYSICAL EXAM:  General: Alert, cooperative, no distress, appears stated age.  Head: Normocephalic, without obvious abnormality, atraumatic. Eyes: Conjunctivae clear, anicteric sclerae. Pupils are equal ENT did not examine- has a mask Neck: Supple, symmetrical, no adenopathy, thyroid: non tender no carotid bruit and no JVD. Back: No CVA tenderness. Lungs: Clear to auscultation bilaterally. No Wheezing or Rhonchi. No rales. Heart: Regular rate and rhythm, no murmur, rub or gallop. Abdomen: Soft, non-tender,not distended. Bowel sounds normal. No masses Extremities: rt picc site clean Left foot Open wound- tendon exposed at the MTP great toe plantar surface.    12/12/19       atraumatic, no cyanosis. No edema. No clubbing Skin: No rashes or lesions. Or bruising Lymph: Cervical, supraclavicular normal. Neurologic: Grossly non-focal ? Impression/Recommendation ? ?Diabetic foot infection of the left foot  With underlying PAD Underwent debridement of wound and remoal of sesamoid- acute osteomyelitis- MSSa, anerobe cutured- Took 3 weeks of metronidazole completed 6 weeks of IV cefazolin on 12/14/19  PICC removed Started augmentin for 2 more weeks on 12/15/19 The wound is open exposing muscle tendon Will  likely need a graft?? Will discuss with podiatist  SDM- says Hba1c is better controlled and < 6  DM on metformin- says he follows a keto diet  PAD-had angio  Hyperlipidemia on statin   He has an appt with Dr.Cline on 12/21- will discuss with him then. Follow PRN ___________________________________________________

## 2019-12-26 NOTE — Patient Instructions (Signed)
You are here for follow up of the left foot wound- complete the antibiotic and will discuss with Dr.Cline regarding further management when you see him next tuesday

## 2019-12-31 ENCOUNTER — Other Ambulatory Visit: Payer: Self-pay | Admitting: Family Medicine

## 2019-12-31 NOTE — Telephone Encounter (Signed)
Requested Prescriptions  Pending Prescriptions Disp Refills  . ONETOUCH ULTRA test strip [employer] Med Name: ONE TOUCH ULTRA BLUE TESTST(NEW)100] 100 strip 1    Sig: USE TO TEST BLOOD SUGAR ONCE DAILY     Endocrinology: Diabetes - Testing Supplies Passed - 12/31/2019  5:10 PM      Passed - Valid encounter within last 12 months    Recent Outpatient Visits          1 month ago Hospital discharge follow-up   [facility] Duanne Limerick, MD   2 months ago Diabetic foot infection [facility])   [facility] Duanne Limerick, MD   4 months ago Diabetic foot infection [facility])   [facility] Duanne Limerick, MD   7 months ago Type 2 diabetes mellitus with diabetic polyneuropathy, without long-term current use of insulin (HCC)   [facility] Duanne Limerick, MD   10 months ago Annual physical exam   [facility] Duanne Limerick, MD      Future Appointments            In 2 weeks Duanne Limerick, MD [facility]   In 2 months Duanne Limerick, MD [facility]

## 2020-01-01 ENCOUNTER — Other Ambulatory Visit (INDEPENDENT_AMBULATORY_CARE_PROVIDER_SITE_OTHER): Payer: Self-pay | Admitting: Vascular Surgery

## 2020-01-01 DIAGNOSIS — I739 Peripheral vascular disease, unspecified: Secondary | ICD-10-CM

## 2020-01-01 DIAGNOSIS — Z9862 Peripheral vascular angioplasty status: Secondary | ICD-10-CM

## 2020-01-06 ENCOUNTER — Ambulatory Visit (INDEPENDENT_AMBULATORY_CARE_PROVIDER_SITE_OTHER): Payer: PPO | Admitting: Nurse Practitioner

## 2020-01-06 ENCOUNTER — Encounter (INDEPENDENT_AMBULATORY_CARE_PROVIDER_SITE_OTHER): Payer: PPO

## 2020-01-20 ENCOUNTER — Ambulatory Visit (INDEPENDENT_AMBULATORY_CARE_PROVIDER_SITE_OTHER): Payer: PPO | Admitting: Family Medicine

## 2020-01-20 ENCOUNTER — Other Ambulatory Visit: Payer: Self-pay

## 2020-01-20 ENCOUNTER — Encounter: Payer: Self-pay | Admitting: Family Medicine

## 2020-01-20 VITALS — BP 120/80 | HR 80 | Ht 77.0 in | Wt 218.0 lb

## 2020-01-20 DIAGNOSIS — E1142 Type 2 diabetes mellitus with diabetic polyneuropathy: Secondary | ICD-10-CM | POA: Diagnosis not present

## 2020-01-20 DIAGNOSIS — E782 Mixed hyperlipidemia: Secondary | ICD-10-CM

## 2020-01-20 DIAGNOSIS — F5101 Primary insomnia: Secondary | ICD-10-CM

## 2020-01-20 MED ORDER — METFORMIN HCL 500 MG PO TABS: 500.0000 mg | ORAL_TABLET | Freq: Two times a day (BID) | ORAL | 1 refills | Status: DC

## 2020-01-20 MED ORDER — TRAZODONE HCL 50 MG PO TABS: 25.0000 mg | ORAL_TABLET | Freq: Every evening | ORAL | 1 refills | Status: DC | PRN

## 2020-01-20 NOTE — Progress Notes (Signed)
Date:  01/20/2020   Name:  Reginald Nielsen   DOB:  [birth year]   MRN:  [id number]   Chief Complaint: Diabetes (Needs recheck on A1C) and Flu Vaccine  Diabetes He presents for his follow-up diabetic visit. He has type 2 diabetes mellitus. His disease course has been stable. There are no hypoglycemic associated symptoms. Pertinent negatives for hypoglycemia include no dizziness, headaches or nervousness/anxiousness. Pertinent negatives for diabetes include no blurred vision, no chest pain, no fatigue, no foot paresthesias, no foot ulcerations, no polydipsia, no polyphagia, no polyuria, no visual change, no weakness and no weight loss. There are no hypoglycemic complications. Symptoms are improving. There are no diabetic complications. He is following a generally healthy diet. He participates in exercise intermittently.  Insomnia Primary symptoms: fragmented sleep, difficulty falling asleep.  The onset quality is gradual. Past treatments include medication (trazadone).  Hyperlipidemia This is a chronic problem. The current episode started more than 1 year ago. Recent lipid tests were reviewed and are variable. Pertinent negatives include no chest pain, myalgias or shortness of breath. Current antihyperlipidemic treatment includes diet change and statins. The current treatment provides moderate improvement of lipids.    Lab Results  Component Value Date   CREATININE 0.81 11/09/2019   BUN 19 11/09/2019   NA 136 11/09/2019   K 3.9 11/09/2019   CL 103 11/09/2019   CO2 26 11/09/2019   Lab Results  Component Value Date   CHOL 197 05/20/2019   HDL 43 05/20/2019   LDLCALC 121 (H) 05/20/2019   TRIG 188 (H) 05/20/2019   No results found for: TSH Lab Results  Component Value Date   HGBA1C 5.4 11/06/2019   Lab Results  Component Value Date   WBC 7.4 11/09/2019   HGB 13.5 11/09/2019   HCT 40.9 11/09/2019   MCV 83.5 11/09/2019   PLT 254 11/09/2019   Lab Results  Component Value Date    ALT 14 11/01/2019   AST 13 (L) 11/01/2019   ALKPHOS 40 11/01/2019   BILITOT 0.9 11/01/2019     Review of Systems  Constitutional: Negative for chills, fatigue, fever and weight loss.  HENT: Negative for drooling, ear discharge, ear pain and sore throat.   Eyes: Negative for blurred vision.  Respiratory: Negative for cough, shortness of breath and wheezing.   Cardiovascular: Negative for chest pain, palpitations and leg swelling.  Gastrointestinal: Negative for abdominal pain, blood in stool, constipation, diarrhea and nausea.  Endocrine: Negative for polydipsia, polyphagia and polyuria.  Genitourinary: Negative for dysuria, frequency, hematuria and urgency.  Musculoskeletal: Negative for back pain, myalgias and neck pain.  Skin: Negative for rash.  Allergic/Immunologic: Negative for environmental allergies.  Neurological: Negative for dizziness, weakness and headaches.  Hematological: Does not bruise/bleed easily.  Psychiatric/Behavioral: Negative for suicidal ideas. The patient has insomnia. The patient is not nervous/anxious.     Patient Active Problem List   Diagnosis Date Noted  . Abscess of left foot   . Osteomyelitis ([place]) 11/02/2019  . Left foot infection 11/01/2019  . DM (diabetes mellitus), type 2 with peripheral vascular complications ([place]) 35/59/7416  . PAD (peripheral artery disease) ([place]) 11/01/2019  . HLD (hyperlipidemia) 11/01/2019  . Hypertension   . Diabetic ulcer of left midfoot associated with type 2 diabetes mellitus, with necrosis of muscle ([place])   . Atherosclerosis of native arteries of the extremities with ulceration ([place]) 08/26/2019  . Diabetes ([place]) 08/26/2019  . DJD (degenerative joint disease) 08/26/2019  . Special screening for malignant neoplasms,  colon     No Known Allergies  Past Surgical History:  Procedure Laterality Date  . COLONOSCOPY WITH PROPOFOL N/A 04/08/2019   Procedure: COLONOSCOPY WITH PROPOFOL;  Surgeon: Lucilla Lame, MD;   Location: [place];  Service: Endoscopy;  Laterality: N/A;  . frozen shoulder    . IRRIGATION AND DEBRIDEMENT FOOT Left 11/02/2019   Procedure: IRRIGATION AND DEBRIDEMENT FOOT;  Surgeon: Sharlotte Alamo, DPM;  Location: [facility];  Service: Podiatry;  Laterality: Left;  . IRRIGATION AND DEBRIDEMENT FOOT Left 11/06/2019   Procedure: IRRIGATION AND DEBRIDEMENT FOOT;  Surgeon: Sharlotte Alamo, DPM;  Location: [facility];  Service: Podiatry;  Laterality: Left;  . LOWER EXTREMITY ANGIOGRAPHY Left 09/03/2019   Procedure: LOWER EXTREMITY ANGIOGRAPHY;  Surgeon: Katha Cabal, MD;  Location: [place] [facility];  Service: Cardiovascular;  Laterality: Left;  . LOWER EXTREMITY ANGIOGRAPHY Left 11/05/2019   Procedure: Lower Extremity Angiography;  Surgeon: Katha Cabal, MD;  Location: [place] [facility];  Service: Cardiovascular;  Laterality: Left;  . SHOULDER SURGERY     2015 and 2016    Social History   Tobacco Use  . Smoking status: Never Smoker  . Smokeless tobacco: Current User    Types: Chew  Vaping Use  . Vaping Use: Never used  Substance Use Topics  . Alcohol use: Not Currently    Comment: quit 11/2017  . Drug use: Not Currently     Medication list has been reviewed and updated.  Current Meds  Medication Sig  . amoxicillin-clavulanate (AUGMENTIN) 875-125 MG tablet Take by mouth.  Marland Kitchen aspirin EC 81 MG tablet Take 81 mg by mouth daily.  Marland Kitchen atorvastatin (LIPITOR) 10 MG tablet Take 1 tablet (10 mg total) by mouth daily. Additional refills per the patient's primary care  . Blood Glucose Monitoring Suppl (ONE TOUCH ULTRA 2) w/Device KIT   . clopidogrel (PLAVIX) 75 MG tablet Take 1 tablet (75 mg total) by mouth daily.  . COMBIGAN 0.2-0.5 % ophthalmic solution Place 1 drop into both eyes 2 (two) times daily.   . Lancets (ONETOUCH DELICA PLUS [id number]) MISC USE DAILY  . latanoprost (XALATAN) 0.005 % ophthalmic solution Place 1 drop into both eyes at bedtime.   . metFORMIN  (GLUCOPHAGE) 500 MG tablet Take 1 tablet (500 mg total) by mouth 2 (two) times daily with a meal.  . ONETOUCH ULTRA test strip USE TO TEST BLOOD SUGAR ONCE DAILY  . traZODone (DESYREL) 50 MG tablet Take 0.5-1 tablets (25-50 mg total) by mouth at bedtime as needed for sleep.  . [DISCONTINUED] gabapentin (NEURONTIN) 100 MG capsule Take 1 capsule (100 mg total) by mouth 3 (three) times daily.    PHQ 2/9 Scores 11/01/2019 08/16/2019 05/22/2019 02/13/2019  PHQ - 2 Score 0 0 1 0  PHQ- 9 Score 0 3 6 0    GAD 7 : Generalized Anxiety Score 11/01/2019 08/16/2019 02/13/2019 12/31/2018  Nervous, Anxious, on Edge 0 1 0 1  Control/stop worrying 0 1 0 0  Worry too much - different things 0 1 0 0  Trouble relaxing 0 0 0 2  Restless 0 0 0 2  Easily annoyed or irritable 0 0 0 0  Afraid - awful might happen 0 1 0 0  Total GAD 7 Score 0 4 0 5  Anxiety Difficulty - Not difficult at all - Not difficult at all    BP Readings from Last 3 Encounters:  01/20/20 120/80  12/26/19 127/81  12/12/19 129/77    Physical Exam  Vitals and nursing note reviewed.  HENT:     Head: Normocephalic.     Right Ear: Tympanic membrane, ear canal and external ear normal. There is no impacted cerumen.     Left Ear: Tympanic membrane, ear canal and external ear normal. There is no impacted cerumen.     Nose: Nose normal. No congestion or rhinorrhea.     Mouth/Throat:     Mouth: Oropharynx is clear and moist. Mucous membranes are moist.  Eyes:     General: No scleral icterus.       Right eye: No discharge.        Left eye: No discharge.     Extraocular Movements: EOM normal.     Conjunctiva/sclera: Conjunctivae normal.     Pupils: Pupils are equal, round, and reactive to light.  Neck:     Thyroid: No thyromegaly.     Vascular: No JVD.     Trachea: No tracheal deviation.  Cardiovascular:     Rate and Rhythm: Normal rate and regular rhythm.     Pulses: Intact distal pulses.     Heart sounds: Normal heart sounds. No murmur  heard. No friction rub. No gallop.   Pulmonary:     Effort: No respiratory distress.     Breath sounds: Normal breath sounds. No wheezing, rhonchi or rales.  Abdominal:     General: Bowel sounds are normal.     Palpations: Abdomen is soft. There is no hepatosplenomegaly or mass.     Tenderness: There is no abdominal tenderness. There is no CVA tenderness, guarding or rebound.  Musculoskeletal:        General: No tenderness or edema. Normal range of motion.     Cervical back: Normal range of motion and neck supple.  Lymphadenopathy:     Cervical: No cervical adenopathy.  Skin:    General: Skin is warm.     Findings: No rash.  Neurological:     Mental Status: He is alert and oriented to person, place, and time.     Cranial Nerves: No cranial nerve deficit.     Deep Tendon Reflexes: Strength normal and reflexes are normal and symmetric.     Wt Readings from Last 3 Encounters:  01/20/20 218 lb (98.9 kg)  12/26/19 218 lb (98.9 kg)  12/12/19 218 lb (98.9 kg)    BP 120/80   Pulse 80   Ht '6\' 5"'  (1.956 m)   Wt 218 lb (98.9 kg)   BMI 25.85 kg/m   Assessment and Plan:  1. Type 2 diabetes mellitus with diabetic polyneuropathy, without long-term current use of insulin (HCC) Chronic.  Controlled.  Stable.  After hospitalization and eating diet that has been of a diabetic control nature patient's A1c was noted to be in the 5 range.  His glipizide was discontinued but he would like to continue the sulfonylurea if necessary.  We will check his A1c to see if this will be necessary otherwise we will check a microalbuminuria this if we need to add in a ACE inhibitor or ARB.  We will continue Metformin 500 mg 1 twice a day. - Microalbumin, urine - HgB A1c - metFORMIN (GLUCOPHAGE) 500 MG tablet; Take 1 tablet (500 mg total) by mouth 2 (two) times daily with a meal.  Dispense: 180 tablet; Refill: 1  2. Primary insomnia Ronalee Belts.  Controlled.  Stable.  Continue trazodone 50 mg 1/2 to 1 tablet  nightly as needed. - traZODone (DESYREL) 50 MG tablet; Take 0.5-1 tablets (25-50  mg total) by mouth at bedtime as needed for sleep.  Dispense: 90 tablet; Refill: 1  3. Moderate mixed hyperlipidemia not requiring statin therapy Chronic.  Controlled.  Stable.  Currently stable on dietary approach as well as atorvastatin 10 mg once a day.  Will check lipid panel for current status. - Lipid Panel With LDL/HDL Ratio

## 2020-01-20 NOTE — Patient Instructions (Signed)
Diabetes Mellitus and Nutrition, Adult When you have diabetes, or diabetes mellitus, it is very important to have healthy eating habits because your blood sugar (glucose) levels are greatly affected by what you eat and drink. Eating healthy foods in the right amounts, at about the same times every day, can help you:  Control your blood glucose.  Lower your risk of heart disease.  Improve your blood pressure.  Reach or maintain a healthy weight. What can affect my meal plan? Every person with diabetes is different, and each person has different needs for a meal plan. Your health care provider may recommend that you work with a dietitian to make a meal plan that is best for you. Your meal plan may vary depending on factors such as:  The calories you need.  The medicines you take.  Your weight.  Your blood glucose, blood pressure, and cholesterol levels.  Your activity level.  Other health conditions you have, such as heart or kidney disease. How do carbohydrates affect me? Carbohydrates, also called carbs, affect your blood glucose level more than any other type of food. Eating carbs naturally raises the amount of glucose in your blood. Carb counting is a method for keeping track of how many carbs you eat. Counting carbs is important to keep your blood glucose at a healthy level, especially if you use insulin or take certain oral diabetes medicines. It is important to know how many carbs you can safely have in each meal. This is different for every person. Your dietitian can help you calculate how many carbs you should have at each meal and for each snack. How does alcohol affect me? Alcohol can cause a sudden decrease in blood glucose (hypoglycemia), especially if you use insulin or take certain oral diabetes medicines. Hypoglycemia can be a life-threatening condition. Symptoms of hypoglycemia, such as sleepiness, dizziness, and confusion, are similar to symptoms of having too much  alcohol.  Do not drink alcohol if: ? Your health care provider tells you not to drink. ? You are pregnant, may be pregnant, or are planning to become pregnant.  If you drink alcohol: ? Do not drink on an empty stomach. ? Limit how much you use to:  0-1 drink a day for women.  0-2 drinks a day for men. ? Be aware of how much alcohol is in your drink. In the [place] one drink equals one 12 oz bottle of beer (355 mL), one 5 oz glass of wine (148 mL), or one 1 oz glass of hard liquor (44 mL). ? Keep yourself hydrated with water, diet soda, or unsweetened iced tea.  Keep in mind that regular soda, juice, and other mixers may contain a lot of sugar and must be counted as carbs. What are tips for following this plan? Reading food labels  Start by checking the serving size on the "Nutrition Facts" label of packaged foods and drinks. The amount of calories, carbs, fats, and other nutrients listed on the label is based on one serving of the item. Many items contain more than one serving per package.  Check the total grams (g) of carbs in one serving. You can calculate the number of servings of carbs in one serving by dividing the total carbs by 15. For example, if a food has 30 g of total carbs per serving, it would be equal to 2 servings of carbs.  Check the number of grams (g) of saturated fats and trans fats in one serving. Choose foods that have   a low amount or none of these fats.  Check the number of milligrams (mg) of salt (sodium) in one serving. Most people should limit total sodium intake to less than 2,300 mg per day.  Always check the nutrition information of foods labeled as "low-fat" or "nonfat." These foods may be higher in added sugar or refined carbs and should be avoided.  Talk to your dietitian to identify your daily goals for nutrients listed on the label. Shopping  Avoid buying canned, pre-made, or processed foods. These foods tend to be high in fat, sodium, and added  sugar.  Shop around the outside edge of the grocery store. This is where you will most often find fresh fruits and vegetables, bulk grains, fresh meats, and fresh dairy. Cooking  Use low-heat cooking methods, such as baking, instead of high-heat cooking methods like deep frying.  Cook using healthy oils, such as olive, canola, or sunflower oil.  Avoid cooking with butter, cream, or high-fat meats. Meal planning  Eat meals and snacks regularly, preferably at the same times every day. Avoid going long periods of time without eating.  Eat foods that are high in fiber, such as fresh fruits, vegetables, beans, and whole grains. Talk with your dietitian about how many servings of carbs you can eat at each meal.  Eat 4-6 oz (112-168 g) of lean protein each day, such as lean meat, chicken, fish, eggs, or tofu. One ounce (oz) of lean protein is equal to: ? 1 oz (28 g) of meat, chicken, or fish. ? 1 egg. ?  cup (62 g) of tofu.  Eat some foods each day that contain healthy fats, such as avocado, nuts, seeds, and fish.   What foods should I eat? Fruits Berries. Apples. Oranges. Peaches. Apricots. Plums. Grapes. Mango. Papaya. Pomegranate. Kiwi. Cherries. Vegetables Lettuce. Spinach. Leafy greens, including kale, chard, collard greens, and mustard greens. Beets. Cauliflower. Cabbage. Broccoli. Carrots. Green beans. Tomatoes. Peppers. Onions. Cucumbers. Brussels sprouts. Grains Whole grains, such as whole-wheat or whole-grain bread, crackers, tortillas, cereal, and pasta. Unsweetened oatmeal. Quinoa. Brown or wild rice. Meats and other proteins Seafood. Poultry without skin. Lean cuts of poultry and beef. Tofu. Nuts. Seeds. Dairy Low-fat or fat-free dairy products such as milk, yogurt, and cheese. The items listed above may not be a complete list of foods and beverages you can eat. Contact a dietitian for more information. What foods should I avoid? Fruits Fruits canned with  syrup. Vegetables Canned vegetables. Frozen vegetables with butter or cream sauce. Grains Refined white flour and flour products such as bread, pasta, snack foods, and cereals. Avoid all processed foods. Meats and other proteins Fatty cuts of meat. Poultry with skin. Breaded or fried meats. Processed meat. Avoid saturated fats. Dairy Full-fat yogurt, cheese, or milk. Beverages Sweetened drinks, such as soda or iced tea. The items listed above may not be a complete list of foods and beverages you should avoid. Contact a dietitian for more information. Questions to ask a health care provider  Do I need to meet with a diabetes educator?  Do I need to meet with a dietitian?  What number can I call if I have questions?  When are the best times to check my blood glucose? Where to find more information:  American Diabetes Association: diabetes.org  Academy of Nutrition and Dietetics: www.eatright.org  National Institute of Diabetes and Digestive and Kidney Diseases: www.niddk.nih.gov  Association of Diabetes Care and Education Specialists: www.diabeteseducator.org Summary  It is important to have healthy eating   habits because your blood sugar (glucose) levels are greatly affected by what you eat and drink.  A healthy meal plan will help you control your blood glucose and maintain a healthy lifestyle.  Your health care provider may recommend that you work with a dietitian to make a meal plan that is best for you.  Keep in mind that carbohydrates (carbs) and alcohol have immediate effects on your blood glucose levels. It is important to count carbs and to use alcohol carefully. This information is not intended to replace advice given to you by your health care provider. Make sure you discuss any questions you have with your health care provider. Document Revised: 12/04/2018 Document Reviewed: 12/04/2018 [employer] Patient Education  2021 [employer]  

## 2020-01-21 DIAGNOSIS — L97512 Non-pressure chronic ulcer of other part of right foot with fat layer exposed: Secondary | ICD-10-CM | POA: Diagnosis not present

## 2020-01-21 DIAGNOSIS — L97523 Non-pressure chronic ulcer of other part of left foot with necrosis of muscle: Secondary | ICD-10-CM | POA: Diagnosis not present

## 2020-01-21 DIAGNOSIS — E1140 Type 2 diabetes mellitus with diabetic neuropathy, unspecified: Secondary | ICD-10-CM | POA: Diagnosis not present

## 2020-01-21 LAB — HEMOGLOBIN A1C
Est. average glucose Bld gHb Est-mCnc: 114 mg/dL
Hgb A1c MFr Bld: 5.6 % (ref 4.8–5.6)

## 2020-01-21 LAB — MICROALBUMIN, URINE: Microalbumin, Urine: 135.2 ug/mL

## 2020-01-22 ENCOUNTER — Encounter (INDEPENDENT_AMBULATORY_CARE_PROVIDER_SITE_OTHER): Payer: PPO

## 2020-01-22 ENCOUNTER — Encounter (INDEPENDENT_AMBULATORY_CARE_PROVIDER_SITE_OTHER): Payer: Self-pay

## 2020-01-22 ENCOUNTER — Ambulatory Visit (INDEPENDENT_AMBULATORY_CARE_PROVIDER_SITE_OTHER): Payer: PPO | Admitting: Nurse Practitioner

## 2020-01-24 DIAGNOSIS — I87312 Chronic venous hypertension (idiopathic) with ulcer of left lower extremity: Secondary | ICD-10-CM | POA: Diagnosis not present

## 2020-02-04 DIAGNOSIS — E1140 Type 2 diabetes mellitus with diabetic neuropathy, unspecified: Secondary | ICD-10-CM | POA: Diagnosis not present

## 2020-02-04 DIAGNOSIS — L97523 Non-pressure chronic ulcer of other part of left foot with necrosis of muscle: Secondary | ICD-10-CM | POA: Diagnosis not present

## 2020-02-04 DIAGNOSIS — L97512 Non-pressure chronic ulcer of other part of right foot with fat layer exposed: Secondary | ICD-10-CM | POA: Diagnosis not present

## 2020-02-04 DIAGNOSIS — I739 Peripheral vascular disease, unspecified: Secondary | ICD-10-CM | POA: Diagnosis not present

## 2020-02-12 ENCOUNTER — Other Ambulatory Visit (INDEPENDENT_AMBULATORY_CARE_PROVIDER_SITE_OTHER): Payer: Self-pay | Admitting: Vascular Surgery

## 2020-02-24 DIAGNOSIS — I87312 Chronic venous hypertension (idiopathic) with ulcer of left lower extremity: Secondary | ICD-10-CM | POA: Diagnosis not present

## 2020-03-02 ENCOUNTER — Other Ambulatory Visit: Payer: Self-pay

## 2020-03-02 ENCOUNTER — Encounter (HOSPITAL_BASED_OUTPATIENT_CLINIC_OR_DEPARTMENT_OTHER): Payer: PPO | Attending: Internal Medicine | Admitting: Internal Medicine

## 2020-03-02 DIAGNOSIS — L97528 Non-pressure chronic ulcer of other part of left foot with other specified severity: Secondary | ICD-10-CM | POA: Insufficient documentation

## 2020-03-02 DIAGNOSIS — E1151 Type 2 diabetes mellitus with diabetic peripheral angiopathy without gangrene: Secondary | ICD-10-CM | POA: Diagnosis not present

## 2020-03-02 DIAGNOSIS — L97522 Non-pressure chronic ulcer of other part of left foot with fat layer exposed: Secondary | ICD-10-CM | POA: Diagnosis not present

## 2020-03-02 DIAGNOSIS — L97524 Non-pressure chronic ulcer of other part of left foot with necrosis of bone: Secondary | ICD-10-CM | POA: Diagnosis not present

## 2020-03-02 DIAGNOSIS — E1142 Type 2 diabetes mellitus with diabetic polyneuropathy: Secondary | ICD-10-CM | POA: Diagnosis not present

## 2020-03-02 DIAGNOSIS — Z8249 Family history of ischemic heart disease and other diseases of the circulatory system: Secondary | ICD-10-CM | POA: Diagnosis not present

## 2020-03-02 DIAGNOSIS — Z833 Family history of diabetes mellitus: Secondary | ICD-10-CM | POA: Insufficient documentation

## 2020-03-02 DIAGNOSIS — E11621 Type 2 diabetes mellitus with foot ulcer: Secondary | ICD-10-CM | POA: Insufficient documentation

## 2020-03-03 ENCOUNTER — Telehealth (INDEPENDENT_AMBULATORY_CARE_PROVIDER_SITE_OTHER): Payer: Self-pay | Admitting: Vascular Surgery

## 2020-03-03 ENCOUNTER — Other Ambulatory Visit (HOSPITAL_COMMUNITY)
Admission: RE | Admit: 2020-03-03 | Discharge: 2020-03-03 | Disposition: A | Discharge status: Home / Self Care | Payer: PPO | Source: Other Acute Inpatient Hospital | Attending: Internal Medicine | Admitting: Internal Medicine

## 2020-03-03 ENCOUNTER — Other Ambulatory Visit (INDEPENDENT_AMBULATORY_CARE_PROVIDER_SITE_OTHER): Payer: Self-pay | Admitting: Vascular Surgery

## 2020-03-03 DIAGNOSIS — E11621 Type 2 diabetes mellitus with foot ulcer: Secondary | ICD-10-CM | POA: Diagnosis not present

## 2020-03-03 DIAGNOSIS — Z9862 Peripheral vascular angioplasty status: Secondary | ICD-10-CM

## 2020-03-03 DIAGNOSIS — I739 Peripheral vascular disease, unspecified: Secondary | ICD-10-CM

## 2020-03-03 NOTE — Progress Notes (Signed)
Reginald Nielsen, Reginald Nielsen ([id number]) . Visit Report for 03/02/2020 Allergy List Details Patient Name: Date of Service: Reginald Nielsen, Reginald Nielsen 03/02/2020 2:45 PM Medical Record Number: [id number] Patient Account Number: [id number] Date of Birth/Sex: Treating RN: 1960-11-06 (59 y.o. Male) Fonnie Mu Primary Care Murdock Jellison: Alvino Chapel NES, DEA NNA Other Clinician: Referring Jakye Mullens: Treating Juvenal Umar/Extender: Renford Dills in Treatment: 0 Allergies Active Allergies No Known Allergies Allergy Notes Electronic Signature(s) Signed: 03/03/2020 5:43:17 PM By: Fonnie Mu RN Entered By: Fonnie Mu on 03/02/2020 15:33:56 -------------------------------------------------------------------------------- Arrival Information Details Patient Name: Date of Service: Reginald Nielsen 03/02/2020 2:45 PM Medical Record Number: [id number] Patient Account Number: [id number] Date of Birth/Sex: Treating RN: 1960-12-05 (59 y.o. Male) Fonnie Mu Primary Care Bond Grieshop: Alvino Chapel NES, DEA NNA Other Clinician: Referring Tonia Avino: Treating Mayetta Castleman/Extender: Renford Dills in Treatment: 0 Visit Information Patient Arrived: Ambulatory Arrival Time: 15:32 Accompanied By: wife Transfer Assistance: None Patient Identification Verified: Yes Secondary Verification Process Completed: Yes Patient Requires Transmission-Based Precautions: No Patient Has Alerts: No Electronic Signature(s) Signed: 03/03/2020 5:43:17 PM By: Fonnie Mu RN Entered By: Fonnie Mu on 03/02/2020 15:32:11 -------------------------------------------------------------------------------- Clinic Level of Care Assessment Details Patient Name: Date of Service: Reginald Nielsen, Reginald Nielsen 03/02/2020 2:45 PM Medical Record Number: [id number] Patient Account Number: [id number] Date of Birth/Sex: Treating RN: [birth year] (59 y.o. Male) Zandra Abts Primary Care Edson Deridder: Alvino Chapel NES, DEA NNA Other  Clinician: Referring Zaydah Nawabi: Treating Kacper Cartlidge/Extender: Renford Dills in Treatment: 0 Clinic Level of Care Assessment Items TOOL 1 Quantity Score X- 1 0 Use when EandM and Procedure is performed on INITIAL visit ASSESSMENTS - Nursing Assessment / Reassessment X- 1 20 General Physical Exam (combine w/ comprehensive assessment (listed just below) when performed on new pt. evals) X- 1 25 Comprehensive Assessment (HX, ROS, Risk Assessments, Wounds Hx, etc.) ASSESSMENTS - Wound and Skin Assessment / Reassessment []  - 0 Dermatologic / Skin Assessment (not related to wound area) ASSESSMENTS - Ostomy and/or Continence Assessment and Care []  - 0 Incontinence Assessment and Management []  - 0 Ostomy Care Assessment and Management (repouching, etc.) PROCESS - Coordination of Care X - Simple Patient / Family Education for ongoing care 1 15 []  - 0 Complex (extensive) Patient / Family Education for ongoing care X- 1 10 Staff obtains Chiropractor, Records, Nielsen Results / Process Orders est []  - 0 Staff telephones HHA, Nursing Homes / Clarify orders / etc []  - 0 Routine Transfer to another Facility (non-emergent condition) []  - 0 Routine Hospital Admission (non-emergent condition) X- 1 15 New Admissions / Manufacturing engineer / Ordering NPWT Apligraf, etc. , []  - 0 Emergency Hospital Admission (emergent condition) PROCESS - Special Needs []  - 0 Pediatric / Minor Patient Management []  - 0 Isolation Patient Management []  - 0 Hearing / Language / Visual special needs []  - 0 Assessment of Community assistance (transportation, D/C planning, etc.) []  - 0 Additional assistance / Altered mentation []  - 0 Support Surface(s) Assessment (bed, cushion, seat, etc.) INTERVENTIONS - Miscellaneous []  - 0 External ear exam []  - 0 Patient Transfer (multiple staff / Nurse, adult / Similar devices) []  - 0 Simple Staple / Suture removal (25 or less) []  - 0 Complex Staple /  Suture removal (26 or more) []  - 0 Hypo/Hyperglycemic Management (do not check if billed separately) X- 1 15 Ankle / Brachial Index (ABI) - do not check if billed separately Has the patient been seen at the hospital within the last three years: Yes Total Score: 100 Level  Of Care: New/Established - Level 3 Electronic Signature(s) Signed: 03/02/2020 5:41:15 PM By: Zandra Abts RN, BSN Entered By: Zandra Abts on 03/02/2020 17:29:26 -------------------------------------------------------------------------------- Encounter Discharge Information Details Patient Name: Date of Service: Reginald Nielsen 03/02/2020 2:45 PM Medical Record Number: [id number] Patient Account Number: [id number] Date of Birth/Sex: Treating RN: [birth year] (59 y.o. Male) Shawn Stall Primary Care Nyemah Watton: Alvino Chapel NES, DEA NNA Other Clinician: Referring Diedra Sinor: Treating Quartez Lagos/Extender: Renford Dills in Treatment: 0 Encounter Discharge Information Items Post Procedure Vitals Discharge Condition: Stable Temperature (F): 98.3 Ambulatory Status: Ambulatory Pulse (bpm): 76 Discharge Destination: Home Respiratory Rate (breaths/min): 17 Transportation: Private Auto Blood Pressure (mmHg): 140/87 Accompanied By: wife Schedule Follow-up Appointment: Yes Clinical Summary of Care: Notes surgical shoe. Electronic Signature(s) Signed: 03/02/2020 6:14:41 PM By: Shawn Stall Entered By: Shawn Stall on 03/02/2020 17:29:53 -------------------------------------------------------------------------------- Lower Extremity Assessment Details Patient Name: Date of Service: Reginald Nielsen, Reginald Nielsen 03/02/2020 2:45 PM Medical Record Number: [id number] Patient Account Number: [id number] Date of Birth/Sex: Treating RN: [birth year] (59 y.o. Male) Fonnie Mu Primary Care Delvon Chipps: Alvino Chapel NES, DEA NNA Other Clinician: Referring Leonarda Leis: Treating Shirleymae Hauth/Extender: Renford Dills in  Treatment: 0 Edema Assessment Assessed: Kyra Searles: Yes] [Right: No] Edema: [Left: N] [Right: o] Calf Left: Right: Point of Measurement: 42 cm From Medial Instep 37 cm Ankle Left: Right: Point of Measurement: 12 cm From Medial Instep 22 cm Knee To Floor Left: Right: From Medial Instep 48 cm Vascular Assessment Pulses: Dorsalis Pedis Palpable: [Left:Yes] Posterior Tibial Palpable: [Left:Yes] Blood Pressure: Brachial: [Right:140] Ankle: [Right:Dorsalis Pedis: 144 1.03] Electronic Signature(s) Signed: 03/03/2020 5:43:17 PM By: Fonnie Mu RN Entered By: Fonnie Mu on 03/02/2020 16:19:45 -------------------------------------------------------------------------------- Multi Wound Chart Details Patient Name: Date of Service: Reginald Nielsen 03/02/2020 2:45 PM Medical Record Number: [id number] Patient Account Number: [id number] Date of Birth/Sex: Treating RN: 1960-11-12 (59 y.o. Male) Zandra Abts Primary Care Sherol Sabas: Alvino Chapel NES, DEA NNA Other Clinician: Referring Zarai Orsborn: Treating Sandy Haye/Extender: Renford Dills in Treatment: 0 Vital Signs Height(in): 77 Capillary Blood Glucose(mg/dl): 90 Weight(lbs): 413 Pulse(bpm): 76 Body Mass Index(BMI): 27 Blood Pressure(mmHg): 140/87 Temperature(F): 98.3 Respiratory Rate(breaths/min): 17 Photos: [1:No Photos Left, Plantar Metatarsal head first] [2:No Photos Left, Lateral Foot] [N/A:N/A N/A] Wound Location: [1:Gradually Appeared] [2:Pressure Injury] [N/A:N/A] Wounding Event: [1:Diabetic Wound/Ulcer of the Lower] [2:Pressure Ulcer] [N/A:N/A] Primary Etiology: [1:Extremity Peripheral Arterial Disease, Type II] [2:Peripheral Arterial Disease, Type II] [N/A:N/A] Comorbid History: [1:Diabetes, Neuropathy 07/11/2019] [2:Diabetes, Neuropathy 02/14/2020] [N/A:N/A] Date Acquired: [1:0] [2:0] [N/A:N/A] Weeks of Treatment: [1:Open] [2:Open] [N/A:N/A] Wound Status: [1:Yes] [2:No] [N/A:N/A] Pending A mputation  on Presentation: [1:4.5x2.9x1.7] [2:2.4x1.2x0.1] [N/A:N/A] Measurements L x W x D (cm) [1:10.249] [2:2.262] [N/A:N/A] A (cm) : rea [1:17.424] [2:0.226] [N/A:N/A] Volume (cm) : [1:4] Position 1 (o'clock): [1:3.4] Maximum Distance 1 (cm): [1:12] Starting Position 1 (o'clock): [1:4] Ending Position 1 (o'clock): [1:2] Maximum Distance 1 (cm): [1:Yes] [2:No] [N/A:N/A] Tunneling: [1:Yes] [2:No] [N/A:N/A] Undermining: [1:Grade 2] [2:Unstageable/Unclassified] [N/A:N/A] Classification: [1:Medium] [2:Medium] [N/A:N/A] Exudate A mount: [1:Purulent] [2:Serosanguineous] [N/A:N/A] Exudate Type: [1:yellow, brown, green] [2:red, brown] [N/A:N/A] Exudate Color: [1:Distinct, outline attached] [2:Distinct, outline attached] [N/A:N/A] Wound Margin: [1:None Present (0%)] [2:None Present (0%)] [N/A:N/A] Granulation A mount: [1:Large (67-100%)] [2:Large (67-100%)] [N/A:N/A] Necrotic A mount: [1:Adherent Slough] [2:Eschar] [N/A:N/A] Necrotic Tissue: [1:Fascia: No] [2:Fascia: No] [N/A:N/A] Exposed Structures: [1:Fat Layer (Subcutaneous Tissue): No Tendon: No Muscle: No Joint: No Bone: No None] [2:Fat Layer (Subcutaneous Tissue): No Tendon: No Muscle: No Joint: No Bone: No None] [N/A:N/A] Epithelialization: [1:Debridement - Excisional] [2:Chemical/Enzymatic/Mechanical] [N/A:N/A] Debridement:  Pre-procedure Verification/Time Out 16:44 [2:N/A] [N/A:N/A] Taken: [1:Callus, Subcutaneous] [2:N/A] [N/A:N/A] Tissue Debrided: [1:Skin/Subcutaneous Tissue] [2:N/A] [N/A:N/A] Level: [1:13.05] [2:N/A] [N/A:N/A] Debridement A (sq cm): [1:rea Curette] [2:N/A] [N/A:N/A] Instrument: [1:Moderate] [2:None] [N/A:N/A] Bleeding: [1:Pressure] [2:N/A] [N/A:N/A] Hemostasis Achieved: [1:0] [2:0] [N/A:N/A] Procedural Pain: [1:0] [2:0] [N/A:N/A] Post Procedural Pain: [1:Procedure was tolerated well] [2:Procedure was tolerated well] [N/A:N/A] Debridement Treatment Response: [1:4.5x2.9x1.7] [2:2.4x1.2x0.1] [N/A:N/A] Post Debridement  Measurements L x W x D (cm) [1:17.424] [2:0.226] [N/A:N/A] Post Debridement Volume: (cm) [1:N/A] [2:Unstageable/Unclassified] [N/A:N/A] Post Debridement Stage: [1:Debridement] [2:Debridement] [N/A:N/A] Treatment Notes Electronic Signature(s) Signed: 03/02/2020 5:41:15 PM By: Zandra Abts RN, BSN Signed: 03/02/2020 5:55:48 PM By: Baltazar Najjar MD Entered By: Baltazar Najjar on 03/02/2020 17:04:26 -------------------------------------------------------------------------------- Multi-Disciplinary Care Plan Details Patient Name: Date of Service: Reginald Nielsen 03/02/2020 2:45 PM Medical Record Number: [id number] Patient Account Number: [id number] Date of Birth/Sex: Treating RN: 07-21-1960 (60 y.o. Male) Zandra Abts Primary Care Kamil Mchaffie: Alvino Chapel NES, DEA NNA Other Clinician: Referring Calvin Jablonowski: Treating Lydiana Milley/Extender: Renford Dills in Treatment: 0 Multidisciplinary Care Plan reviewed with physician Active Inactive Nutrition Nursing Diagnoses: Impaired glucose control: actual or potential Potential for alteratiion in Nutrition/Potential for imbalanced nutrition Goals: Patient/caregiver agrees to and verbalizes understanding of need to use nutritional supplements and/or vitamins as prescribed Date Initiated: 03/02/2020 Target Resolution Date: 04/03/2020 Goal Status: Active Patient/caregiver will maintain therapeutic glucose control Date Initiated: 03/02/2020 Target Resolution Date: 04/03/2020 Goal Status: Active Interventions: Assess HgA1c results as ordered upon admission and as needed Assess patient nutrition upon admission and as needed per policy Provide education on elevated blood sugars and impact on wound healing Provide education on nutrition Notes: Wound/Skin Impairment Nursing Diagnoses: Impaired tissue integrity Knowledge deficit related to ulceration/compromised skin integrity Goals: Patient/caregiver will verbalize understanding of skin  care regimen Date Initiated: 03/02/2020 Target Resolution Date: 04/03/2020 Goal Status: Active Interventions: Assess patient/caregiver ability to obtain necessary supplies Assess patient/caregiver ability to perform ulcer/skin care regimen upon admission and as needed Assess ulceration(s) every visit Provide education on ulcer and skin care Notes: Electronic Signature(s) Signed: 03/02/2020 5:41:15 PM By: Zandra Abts RN, BSN Entered By: Zandra Abts on 03/02/2020 17:28:50 -------------------------------------------------------------------------------- Pain Assessment Details Patient Name: Date of Service: Reginald Nielsen 03/02/2020 2:45 PM Medical Record Number: [id number] Patient Account Number: [id number] Date of Birth/Sex: Treating RN: 1960-06-18 (59 y.o. Male) Fonnie Mu Primary Care Chrishana Spargur: Alvino Chapel NES, DEA NNA Other Clinician: Referring Kasmira Cacioppo: Treating Daralyn Bert/Extender: Renford Dills in Treatment: 0 Active Problems Location of Pain Severity and Description of Pain Patient Has Paino Yes Site Locations Pain Location: Pain in Ulcers With Dressing Change: Yes Duration of the Pain. Constant / Intermittento Intermittent Rate the pain. Current Pain Level: 6 Worst Pain Level: 10 Least Pain Level: 0 Tolerable Pain Level: 7 Character of Pain Describe the Pain: Aching Pain Management and Medication Current Pain Management: Medication: Yes Cold Application: No Rest: Yes Massage: No Activity: No Nielsen.E.N.S.: No Heat Application: No Leg drop or elevation: No Is the Current Pain Management Adequate: Adequate How does your wound impact your activities of daily livingo Sleep: Yes Bathing: No Appetite: No Relationship With Others: No Bladder Continence: No Emotions: No Bowel Continence: No Work: No Toileting: No Drive: No Dressing: No Hobbies: No Electronic Signature(s) Signed: 03/03/2020 5:43:17 PM By: Fonnie Mu RN Entered  By: Fonnie Mu on 03/02/2020 15:35:17 -------------------------------------------------------------------------------- Patient/Caregiver Education Details Patient Name: Date of Service: Reginald Nielsen 2/21/2022andnbsp2:45 PM Medical Record Number: [id number] Patient Account Number: [id number] Date of Birth/Gender: Treating RN:  July 07, 1960 (60 y.o. Male) Zandra Abts Primary Care Physician: Alvino Chapel NES, DEA NNA Other Clinician: Referring Physician: Treating Physician/Extender: Renford Dills in Treatment: 0 Education Assessment Education Provided To: Patient Education Topics Provided Elevated Blood Sugar/ Impact on Healing: Methods: Explain/Verbal Responses: State content correctly Nutrition: Methods: Explain/Verbal Responses: State content correctly Wound/Skin Impairment: Methods: Explain/Verbal Responses: State content correctly Electronic Signature(s) Signed: 03/02/2020 5:41:15 PM By: Zandra Abts RN, BSN Entered By: Zandra Abts on 03/02/2020 17:29:02 -------------------------------------------------------------------------------- Wound Assessment Details Patient Name: Date of Service: Reginald Nielsen 03/02/2020 2:45 PM Medical Record Number: [id number] Patient Account Number: [id number] Date of Birth/Sex: Treating RN: 1960/01/30 (59 y.o. Male) Fonnie Mu Primary Care Emmersen Garraway: Alvino Chapel NES, DEA NNA Other Clinician: Referring Cyncere Ruhe: Treating Willet Schleifer/Extender: Renford Dills in Treatment: 0 Wound Status Wound Number: 1 Primary Etiology: Diabetic Wound/Ulcer of the Lower Extremity Wound Location: Left, Plantar Metatarsal head first Wound Status: Open Wounding Event: Gradually Appeared Comorbid History: Peripheral Arterial Disease, Type II Diabetes, Neuropathy Date Acquired: 07/11/2019 Weeks Of Treatment: 0 Clustered Wound: No Wound Measurements Length: (cm) 4.5 Width: (cm) 2.9 Depth: (cm) 1.7 Area:  (cm) 10.249 Volume: (cm) 17.424 % Reduction in Area: % Reduction in Volume: Epithelialization: None Tunneling: Yes Position (o'clock): 4 Maximum Distance: (cm) 3.4 Undermining: Yes Starting Position (o'clock): 12 Ending Position (o'clock): 4 Maximum Distance: (cm) 2 Wound Description Classification: Grade 2 Wound Margin: Distinct, outline attached Exudate Amount: Medium Exudate Type: Purulent Exudate Color: yellow, brown, green Foul Odor After Cleansing: No Slough/Fibrino Yes Wound Bed Granulation Amount: None Present (0%) Exposed Structure Necrotic Amount: Large (67-100%) Fascia Exposed: No Necrotic Quality: Adherent Slough Fat Layer (Subcutaneous Tissue) Exposed: No Tendon Exposed: No Muscle Exposed: No Joint Exposed: No Bone Exposed: No Electronic Signature(s) Signed: 03/03/2020 5:43:17 PM By: Fonnie Mu RN Entered By: Fonnie Mu on 03/02/2020 16:13:47 -------------------------------------------------------------------------------- Wound Assessment Details Patient Name: Date of Service: Reginald Nielsen 03/02/2020 2:45 PM Medical Record Number: [id number] Patient Account Number: [id number] Date of Birth/Sex: Treating RN: [birth year] (59 y.o. Male) Fonnie Mu Primary Care Maddie Brazier: Alvino Chapel NES, DEA NNA Other Clinician: Referring Ariana Cavenaugh: Treating Kimberly Nieland/Extender: Renford Dills in Treatment: 0 Wound Status Wound Number: 2 Primary Etiology: Pressure Ulcer Wound Location: Left, Lateral Foot Wound Status: Open Wounding Event: Pressure Injury Comorbid History: Peripheral Arterial Disease, Type II Diabetes, Neuropathy Date Acquired: 02/14/2020 Weeks Of Treatment: 0 Clustered Wound: No Wound Measurements Length: (cm) 2.4 Width: (cm) 1.2 Depth: (cm) 0.1 Area: (cm) 2.262 Volume: (cm) 0.226 % Reduction in Area: % Reduction in Volume: Epithelialization: None Tunneling: No Undermining: No Wound  Description Classification: Unstageable/Unclassified Wound Margin: Distinct, outline attached Exudate Amount: Medium Exudate Type: Serosanguineous Exudate Color: red, brown Foul Odor After Cleansing: No Slough/Fibrino Yes Wound Bed Granulation Amount: None Present (0%) Exposed Structure Necrotic Amount: Large (67-100%) Fascia Exposed: No Necrotic Quality: Eschar Fat Layer (Subcutaneous Tissue) Exposed: No Tendon Exposed: No Muscle Exposed: No Joint Exposed: No Bone Exposed: No Treatment Notes Wound #2 (Foot) Wound Laterality: Left, Lateral Cleanser Normal Saline Discharge Instruction: Cleanse the wound with Normal Saline prior to applying a clean dressing using gauze sponges, not tissue or cotton balls. Soap and Water Discharge Instruction: May shower and wash wound with dial antibacterial soap and water prior to dressing change. Peri-Wound Care Topical Primary Dressing KerraCel Ag Gelling Fiber Dressing, 2x2 in (silver alginate) Discharge Instruction: Apply silver alginate to wound bed as instructed Secondary Dressing Woven Gauze Sponge, Non-Sterile 4x4 in Discharge Instruction: Apply over primary dressing  as directed. Secured With [employer], 4.5x3.1 (in/yd) Discharge Instruction: Secure with Kerlix as directed. Paper Tape, 2x10 (in/yd) Discharge Instruction: Secure dressing with tape as directed. Compression Wrap Compression Stockings Add-Ons Electronic Signature(s) Signed: 03/03/2020 5:43:17 PM By: Fonnie Mu RN Entered By: Fonnie Mu on 03/02/2020 16:16:05 -------------------------------------------------------------------------------- Vitals Details Patient Name: Date of Service: Reginald Nielsen 03/02/2020 2:45 PM Medical Record Number: [id number] Patient Account Number: [id number] Date of Birth/Sex: Treating RN: May 13, 1960 (59 y.o. Male) Fonnie Mu Primary Care Tashi Andujo: Alvino Chapel NES, DEA NNA Other Clinician: Referring  Maat Kafer: Treating Raffaela Ladley/Extender: Renford Dills in Treatment: 0 Vital Signs Time Taken: 15:32 Temperature (F): 98.3 Height (in): 77 Pulse (bpm): 76 Source: Stated Respiratory Rate (breaths/min): 17 Weight (lbs): 225 Blood Pressure (mmHg): 140/87 Source: Stated Capillary Blood Glucose (mg/dl): 90 Body Mass Index (BMI): 26.7 Reference Range: 80 - 120 mg / dl Electronic Signature(s) Signed: 03/03/2020 5:43:17 PM By: Fonnie Mu RN Entered By: Fonnie Mu on 03/02/2020 15:32:53

## 2020-03-03 NOTE — Progress Notes (Signed)
Reginald Nielsen, Reginald Nielsen ([id number]) . Visit Report for 03/02/2020 Abuse/Suicide Risk Screen Details Patient Name: Date of Service: Reginald Nielsen, Reginald Nielsen 03/02/2020 2:45 PM Medical Record Number: [id number] Patient Account Number: [id number] Date of Birth/Sex: Treating RN: [birth year] (60 y.o. Male) Fonnie Mu Primary Care Skylinn Vialpando: Alvino Chapel NES, DEA NNA Other Clinician: Referring Yvonne Petite: Treating Zylon Creamer/Extender: Renford Dills in Treatment: 0 Abuse/Suicide Risk Screen Items Answer ABUSE RISK SCREEN: Has anyone close to you tried to hurt or harm you recentlyo No Do you feel uncomfortable with anyone in your familyo No Has anyone forced you do things that you didnt want to doo No Electronic Signature(s) Signed: 03/03/2020 5:43:17 PM By: Fonnie Mu RN Entered By: Fonnie Mu on 03/02/2020 15:34:04 -------------------------------------------------------------------------------- Activities of Daily Living Details Patient Name: Date of Service: Reginald Nielsen, Reginald Nielsen 03/02/2020 2:45 PM Medical Record Number: [id number] Patient Account Number: [id number] Date of Birth/Sex: Treating RN: 05/25/1960 (60 y.o. Male) Fonnie Mu Primary Care Georgene Kopper: Alvino Chapel NES, DEA NNA Other Clinician: Referring Avanish Cerullo: Treating Darrell Leonhardt/Extender: Renford Dills in Treatment: 0 Activities of Daily Living Items Answer Activities of Daily Living (Please select one for each item) Drive Automobile Completely Able Nielsen Medications ake Completely Able Use Nielsen elephone Completely Able Care for Appearance Completely Able Use Nielsen oilet Completely Able Bath / Shower Completely Able Dress Self Completely Able Feed Self Completely Able Walk Completely Able Get In / Out Bed Completely Able Housework Completely Able Prepare Meals Completely Able Handle Money Completely Able Shop for Self Completely Able Electronic Signature(s) Signed: 03/03/2020 5:43:17 PM By: Fonnie Mu RN Entered By: Fonnie Mu on 03/02/2020 15:34:26 -------------------------------------------------------------------------------- Education Screening Details Patient Name: Date of Service: Reginald Nielsen 03/02/2020 2:45 PM Medical Record Number: [id number] Patient Account Number: [id number] Date of Birth/Sex: Treating RN: 1960-07-20 (59 y.o. Male) Fonnie Mu Primary Care Kamyrah Feeser: Alvino Chapel NES, DEA NNA Other Clinician: Referring Shalom Ware: Treating Kester Stimpson/Extender: Renford Dills in Treatment: 0 Primary Learner Assessed: Patient Learning Preferences/Education Level/Primary Language Learning Preference: Explanation, Demonstration, Printed Material Highest Education Level: College or Above Preferred Language: English Cognitive Barrier Language Barrier: No Translator Needed: No Memory Deficit: No Emotional Barrier: No Cultural/Religious Beliefs Affecting Medical Care: No Physical Barrier Impaired Vision: No Impaired Hearing: No Decreased Hand dexterity: No Knowledge/Comprehension Knowledge Level: High Comprehension Level: High Ability to understand written instructions: High Ability to understand verbal instructions: High Motivation Anxiety Level: Calm Cooperation: Cooperative Education Importance: Denies Need Interest in Health Problems: Asks Questions Perception: Coherent Willingness to Engage in Self-Management High Activities: Readiness to Engage in Self-Management High Activities: Electronic Signature(s) Signed: 03/03/2020 5:43:17 PM By: Fonnie Mu RN Entered By: Fonnie Mu on 03/02/2020 15:37:34 -------------------------------------------------------------------------------- Fall Risk Assessment Details Patient Name: Date of Service: Reginald Nielsen 03/02/2020 2:45 PM Medical Record Number: [id number] Patient Account Number: [id number] Date of Birth/Sex: Treating RN: 10-04-1960 (59 y.o. Male) Fonnie Mu Primary Care Allesha Aronoff: Alvino Chapel NES, DEA NNA Other Clinician: Referring Aizza Santiago: Treating Jerrit Horen/Extender: Renford Dills in Treatment: 0 Fall Risk Assessment Items Have you had 2 or more falls in the last 12 monthso 0 No Have you had any fall that resulted in injury in the last 12 monthso 0 No FALLS RISK SCREEN History of falling - immediate or within 3 months 0 No Secondary diagnosis (Do you have 2 or more medical diagnoseso) 0 No Ambulatory aid None/bed rest/wheelchair/nurse 0 No Crutches/cane/walker 0 No Furniture 0 No Intravenous therapy Access/Saline/Heparin Lock 0 No Gait/Transferring Normal/ bed rest/  wheelchair 0 No Weak (short steps with or without shuffle, stooped but able to lift head while walking, may seek 0 No support from furniture) Impaired (short steps with shuffle, may have difficulty arising from chair, head down, impaired 0 No balance) Mental Status Oriented to own ability 0 No Electronic Signature(s) Signed: 03/03/2020 5:43:17 PM By: Fonnie Mu RN Entered By: Fonnie Mu on 03/02/2020 15:34:35 -------------------------------------------------------------------------------- Foot Assessment Details Patient Name: Date of Service: Reginald Nielsen 03/02/2020 2:45 PM Medical Record Number: [id number] Patient Account Number: [id number] Date of Birth/Sex: Treating RN: Sep 08, 1960 (59 y.o. Male) Fonnie Mu Primary Care Djon Tith: Alvino Chapel NES, DEA NNA Other Clinician: Referring Kaelei Wheeler: Treating Suzana Sohail/Extender: Renford Dills in Treatment: 0 Foot Assessment Items Site Locations + = Sensation present, - = Sensation absent, C = Callus, U = Ulcer R = Redness, W = Warmth, M = Maceration, PU = Pre-ulcerative lesion F = Fissure, S = Swelling, D = Dryness Assessment Right: Left: Other Deformity: No No Prior Foot Ulcer: No No Prior Amputation: No No Charcot Joint: No No Ambulatory Status:  Ambulatory Without Help Gait: Steady Electronic Signature(s) Signed: 03/03/2020 5:43:17 PM By: Fonnie Mu RN Entered By: Fonnie Mu on 03/02/2020 16:06:37 -------------------------------------------------------------------------------- Nutrition Risk Screening Details Patient Name: Date of Service: Reginald Nielsen, Reginald Nielsen 03/02/2020 2:45 PM Medical Record Number: [id number] Patient Account Number: [id number] Date of Birth/Sex: Treating RN: 05/31/1960 (59 y.o. Male) Fonnie Mu Primary Care Raye Wiens: Alvino Chapel NES, DEA NNA Other Clinician: Referring Cherry Wittwer: Treating Annita Ratliff/Extender: Renford Dills in Treatment: 0 Height (in): 77 Weight (lbs): 225 Body Mass Index (BMI): 26.7 Nutrition Risk Screening Items Score Screening NUTRITION RISK SCREEN: I have an illness or condition that made me change the kind and/or amount of food I eat 0 No I eat fewer than two meals per day 0 No I eat few fruits and vegetables, or milk products 0 No I have three or more drinks of beer, liquor or wine almost every day 0 No I have tooth or mouth problems that make it hard for me to eat 0 No I don'Nielsen always have enough money to buy the food I need 0 No I eat alone most of the time 0 No I take three or more different prescribed or over-the-counter drugs a day 0 No Without wanting to, I have lost or gained 10 pounds in the last six months 0 No I am not always physically able to shop, cook and/or feed myself 0 No Nutrition Protocols Good Risk Protocol 0 No interventions needed Moderate Risk Protocol High Risk Proctocol Risk Level: Good Risk Score: 0 Electronic Signature(s) Signed: 03/03/2020 5:43:17 PM By: Fonnie Mu RN Entered By: Fonnie Mu on 03/02/2020 15:34:44

## 2020-03-03 NOTE — Telephone Encounter (Signed)
Patient went to wound care in [facility] yesterday and they him that he needed to

## 2020-03-03 NOTE — Progress Notes (Signed)
JARMEL, LINHARDT T ([id number]) . Visit Report for 03/02/2020 Chief Complaint Document Details Patient Name: Date of Service: Reginald Nielsen, Reginald Nielsen 03/02/2020 2:45 PM Medical Record Number: [id number] Patient Account Number: [id number] Date of Birth/Sex: Treating RN: 1960/06/01 (60 y.o. Male) Levan Hurst Primary Care Provider: Denice Paradise NES, DEA NNA Other Clinician: Referring Provider: Treating Provider/Extender: Dickie La in Treatment: 0 Information Obtained from: Patient Chief Complaint 03/02/2020; patient is here for review of wounds on his left foot Electronic Signature(s) Signed: 03/02/2020 5:55:48 PM By: Linton Ham MD Entered By: Linton Ham on 03/02/2020 17:05:47 -------------------------------------------------------------------------------- Debridement Details Patient Name: Date of Service: Reginald Nielsen 03/02/2020 2:45 PM Medical Record Number: [id number] Patient Account Number: [id number] Date of Birth/Sex: Treating RN: [birth year] (60 y.o. Male) Levan Hurst Primary Care Provider: Denice Paradise NES, DEA NNA Other Clinician: Referring Provider: Treating Provider/Extender: Dickie La in Treatment: 0 Debridement Performed for Assessment: Wound #1 Left,Plantar Metatarsal head first Performed By: Physician Ricard Dillon., MD Debridement Type: Debridement Severity of Tissue Pre Debridement: Fat layer exposed Level of Consciousness (Pre-procedure): Awake and Alert Pre-procedure Verification/Time Out Yes - 16:44 Taken: Start Time: 16:44 T Area Debrided (L x W): otal 4.5 (cm) x 2.9 (cm) = 13.05 (cm) Tissue and other material debrided: Viable, Non-Viable, Callus, Subcutaneous Level: Skin/Subcutaneous Tissue Debridement Description: Excisional Instrument: Curette Bleeding: Moderate Hemostasis Achieved: Pressure End Time: 16:46 Procedural Pain: 0 Post Procedural Pain: 0 Response to Treatment: Procedure was tolerated  well Level of Consciousness (Post- Awake and Alert procedure): Post Debridement Measurements of Total Wound Length: (cm) 4.5 Width: (cm) 2.9 Depth: (cm) 1.7 Volume: (cm) 17.424 Character of Wound/Ulcer Post Debridement: Stable Severity of Tissue Post Debridement: Fat layer exposed Post Procedure Diagnosis Same as Pre-procedure Electronic Signature(s) Signed: 03/02/2020 5:41:15 PM By: Levan Hurst RN, BSN Signed: 03/02/2020 5:55:48 PM By: Linton Ham MD Entered By: Linton Ham on 03/02/2020 17:04:43 -------------------------------------------------------------------------------- Debridement Details Patient Name: Date of Service: Reginald Nielsen 03/02/2020 2:45 PM Medical Record Number: [id number] Patient Account Number: [id number] Date of Birth/Sex: Treating RN: July 28, 1960 (60 y.o. Male) Levan Hurst Primary Care Provider: Denice Paradise NES, DEA NNA Other Clinician: Referring Provider: Treating Provider/Extender: Dickie La in Treatment: 0 Debridement Performed for Assessment: Wound #2 Left,Lateral Foot Performed By: Clinician Levan Hurst, RN Debridement Type: Chemical/Enzymatic/Mechanical Agent Used: anasept and gauze Level of Consciousness (Pre-procedure): Awake and Alert Pre-procedure Verification/Time Out No Taken: Start Time: 17:00 Bleeding: None Procedural Pain: 0 Post Procedural Pain: 0 Response to Treatment: Procedure was tolerated well Level of Consciousness (Post- Awake and Alert procedure): Post Debridement Measurements of Total Wound Length: (cm) 2.4 Stage: Unstageable/Unclassified Width: (cm) 1.2 Depth: (cm) 0.1 Volume: (cm) 0.226 Character of Wound/Ulcer Post Debridement: Improved Post Procedure Diagnosis Same as Pre-procedure Electronic Signature(s) Signed: 03/02/2020 5:41:15 PM By: Levan Hurst RN, BSN Signed: 03/02/2020 5:55:48 PM By: Linton Ham MD Entered By: Linton Ham on 03/02/2020  17:04:55 -------------------------------------------------------------------------------- HPI Details Patient Name: Date of Service: Reginald Nielsen 03/02/2020 2:45 PM Medical Record Number: [id number] Patient Account Number: [id number] Date of Birth/Sex: Treating RN: [birth year] (60 y.o. Male) Levan Hurst Primary Care Provider: Denice Paradise NES, DEA NNA Other Clinician: Referring Provider: Treating Provider/Extender: Dickie La in Treatment: 0 History of Present Illness HPI Description: ADMISSION 03/02/2020 This is a 60 year old diabetic man who is here for review of wounds on his left foot. Referred by Dr. Vickki Muff podiatry at the [place] clinic. He has a complicated recent medical history dating back  to last summer. Apparently he was walking on the beach developed an area on the medial part of the left first metatarsal head. He saw his primary doctor in August to put him on Augmentin and was referred to Dr. Delana Meyer at [facility] vein and vascular. At that time his ABIs were 0.6 on the right and 0.82 on the left. On 8/24 he underwent an angiogram revealing severe tibial disease. He had a atherectomy and angioplasty of the left anterior tibial angioplasty of the left peroneal. By October he was followed at podiatry at the [facility] clinic noted to have still a wound measuring 5 x 2 mm on the left first metatarsal head also an area on the plantar right fifth met head. The right fifth met head eventually closed over. He required admission to the hospital from 11/01/2019 through 11/09/2019. An MRI showed bone marrow signals within the medial hallux sesamoid suspicious for osteomyelitis. Possible air tracking or gas-forming infection. He was taken to the OR for debridement by Dr. Caryl Comes. I believe operative cultures showed MSSA. He was seen by Dr. Steva Ready of infectious disease and he received oral Flagyl and IV Ancef. There was an improvement in his C-reactive protein. I believe  he also received a course of Augmentin either by ID or Dr. Caryl Comes as recently as last month. Follow-up noninvasive studies were done on 9/30 this showed a ABI in the right of 0.77 with a TBI of 0.55. On the left the ABI was 1.35 with a TBI of 0.59 He was seen by Dr. Vickki Muff at the [facility] clinic who they tell me is covering for Dr. Caryl Comes. He wondered about plastic surgery and referred him here for consideration of this. In truth we have not had plastic surgery here since 2017 as such I do not have any more access to plastic surgery consults then any other provider. That being said the patient's wound is completely necrotic today and I really do not think he is a candidate for plastic surgery. Both the patient and his wife asked me about proceeding with a ray amputation that had previously been apparently suggested by Dr. Caryl Comes and after looking at the wound I really could not disagree with this. The only risk of course is sufficient blood flow to heal a surgical area. Past medical history includes type 2 diabetes with peripheral neuropathy, hyperlipidemia and hypertension ABIs in our clinic were surprisingly good on the left at 1.03 Electronic Signature(s) Signed: 03/02/2020 5:55:48 PM By: Linton Ham MD Entered By: Linton Ham on 03/02/2020 17:22:19 -------------------------------------------------------------------------------- Physical Exam Details Patient Name: Date of Service: Reginald Nielsen 03/02/2020 2:45 PM Medical Record Number: [id number] Patient Account Number: [id number] Date of Birth/Sex: Treating RN: April 14, 1960 (60 y.o. Male) Levan Hurst Primary Care Provider: Denice Paradise NES, DEA NNA Other Clinician: Referring Provider: Treating Provider/Extender: Dickie La in Treatment: 0 Constitutional Sitting or standing Blood Pressure is within target range for patient.. Pulse regular and within target range for patient.Marland Kitchen Respirations regular, non-labored  and within target range.. Temperature is normal and within the target range for the patient.Marland Kitchen Appears in no distress. Respiratory work of breathing is normal. Cardiovascular Pedal pulses at the dorsalis pedis is palpable also the posterior tibial but certainly reduced. Integumentary (Hair, Skin) No obvious erythema around any of the wounds. Notes Wound exam The area in question is on the left medial first metatarsal head. Completely necrotic surface. I used pickups and a 15 scalpel to remove some of this however underneath this is  almost all exposed bone. There is undermining laterally towards the mid part of the foot of several centimeters. I did a culture of what looked to be some purulent drainage in the undermining area. There is absolutely no viable tissue that I can see probably exposed tendon medially. He also has an area on the lateral part of the left fifth met head which he thinks was from walking on the outside of his foot. This is covered with necrotic surface I did not debride this Electronic Signature(s) Signed: 03/02/2020 5:55:48 PM By: Linton Ham MD Entered By: Linton Ham on 03/02/2020 17:18:23 -------------------------------------------------------------------------------- Physician Orders Details Patient Name: Date of Service: Reginald Nielsen 03/02/2020 2:45 PM Medical Record Number: [id number] Patient Account Number: [id number] Date of Birth/Sex: Treating RN: 04/14/1960 (60 y.o. Male) Levan Hurst Primary Care Provider: Denice Paradise NES, DEA NNA Other Clinician: Referring Provider: Treating Provider/Extender: Dickie La in Treatment: 0 Verbal / Phone Orders: No Diagnosis Coding Follow-up Appointments Return Appointment in 2 weeks. Bathing/ Shower/ Hygiene May shower with protection but do not get wound dressing(s) wet. Wound Treatment Wound #1 - Metatarsal head first Wound Laterality: Plantar, Left Cleanser: Normal Saline (DME)  (Generic) Every Other Day/30 Days Discharge Instructions: Cleanse the wound with Normal Saline prior to applying a clean dressing using gauze sponges, not tissue or cotton balls. Cleanser: Soap and Water Every Other Day/30 Days Discharge Instructions: May shower and wash wound with dial antibacterial soap and water prior to dressing change. Prim Dressing: KerraCel Ag Gelling Fiber Dressing, 2x2 in (silver alginate) (DME) (Generic) Every Other Day/30 Days ary Discharge Instructions: Apply silver alginate to wound bed as instructed Secondary Dressing: Woven Gauze Sponge, Non-Sterile 4x4 in (DME) (Generic) Every Other Day/30 Days Discharge Instructions: Apply over primary dressing as directed. Secured With: [employer], 4.5x3.1 (in/yd) Every Other Day/30 Days Discharge Instructions: Secure with Kerlix as directed. Secured With: Paper Tape, 2x10 (in/yd) (DME) (Generic) Every Other Day/30 Days Discharge Instructions: Secure dressing with tape as directed. Wound #2 - Foot Wound Laterality: Left, Lateral Cleanser: Normal Saline (DME) (Generic) Every Other Day/30 Days Discharge Instructions: Cleanse the wound with Normal Saline prior to applying a clean dressing using gauze sponges, not tissue or cotton balls. Cleanser: Soap and Water Every Other Day/30 Days Discharge Instructions: May shower and wash wound with dial antibacterial soap and water prior to dressing change. Prim Dressing: KerraCel Ag Gelling Fiber Dressing, 2x2 in (silver alginate) (DME) (Generic) Every Other Day/30 Days ary Discharge Instructions: Apply silver alginate to wound bed as instructed Secondary Dressing: Woven Gauze Sponge, Non-Sterile 4x4 in (DME) (Generic) Every Other Day/30 Days Discharge Instructions: Apply over primary dressing as directed. Secured With: [employer], 4.5x3.1 (in/yd) Every Other Day/30 Days Discharge Instructions: Secure with Kerlix as directed. Secured With: Paper Tape, 2x10 (in/yd)  (DME) (Generic) Every Other Day/30 Days Discharge Instructions: Secure dressing with tape as directed. Laboratory naerobe culture (MICRO) - Left first metatarsal Bacteria identified in Unspecified specimen by A LOINC Code: 283-1 Convenience Name: Anerobic culture Services and Therapies nkle Brachial Index (ABI) w/TBIs and arterial dopplers - [facility] - non healing wounds on left foot - hx of PAD A Electronic Signature(s) Signed: 03/02/2020 5:41:15 PM By: Levan Hurst RN, BSN Signed: 03/02/2020 5:55:48 PM By: Linton Ham MD Entered By: Levan Hurst on 03/02/2020 17:12:53 Prescription 03/02/2020 -------------------------------------------------------------------------------- Tenny Craw MD Patient Name: Provider: 06/06/1960 [id number] Date of Birth: NPI#: Male [id number] Sex: DEA #: [id number] Phone #:  License #: [place] Patient Address: [employer] [address] [place], [place] [address] [place], [place] [address] [phone] Allergies No Known Allergies Provider's Orders nkle Brachial Index (ABI) w/TBIs and arterial dopplers - [facility] - non healing wounds on left foot - hx of PAD A Hand Signature: Date(s): Electronic Signature(s) Signed: 03/02/2020 5:41:15 PM By: Levan Hurst RN, BSN Signed: 03/02/2020 5:55:48 PM By: Linton Ham MD Entered By: Levan Hurst on 03/02/2020 17:12:53 -------------------------------------------------------------------------------- Problem List Details Patient Name: Date of Service: Reginald Nielsen 03/02/2020 2:45 PM Medical Record Number: [id number] Patient Account Number: [id number] Date of Birth/Sex: Treating RN: 01/17/1960 (60 y.o. Male) Levan Hurst Primary Care Provider: Denice Paradise NES, DEA NNA Other Clinician: Referring Provider: Treating Provider/Extender: Dickie La in  Treatment: 0 Active Problems ICD-10 Encounter Code Description Active Date MDM Diagnosis E11.621 Type 2 diabetes mellitus with foot ulcer 03/02/2020 No Yes L97.524 Non-pressure chronic ulcer of other part of left foot with necrosis of bone 03/02/2020 No Yes L97.528 Non-pressure chronic ulcer of other part of left foot with other specified 03/02/2020 No Yes severity E11.51 Type 2 diabetes mellitus with diabetic peripheral angiopathy without gangrene 03/02/2020 No Yes M86.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 Male) Levan Hurst Primary Care Provider: Denice Paradise NES, DEA NNA Other Clinician: Referring Provider: Treating Provider/Extender: Dickie La in Treatment: 0 Subjective Chief Complaint Information obtained from Patient 03/02/2020; patient is here for review of wounds on his left foot History of Present Illness (HPI) ADMISSION 03/02/2020 This is a 60 year old diabetic man who is here for review of wounds on his left foot. Referred by Dr. Vickki Muff podiatry at the [place] [facility]. He has a complicated recent medical history dating back to last summer. Apparently he was walking on the beach developed an area on the medial part of the left first metatarsal head. He saw his primary doctor in August to put him on Augmentin and was referred to Dr. Delana Meyer at [facility] vein and vascular. At that time his ABIs were 0.6 on the right and 0.82 on the left. On 8/24 he underwent an angiogram revealing  severe tibial disease. He had a atherectomy and angioplasty of the left anterior tibial angioplasty of the left peroneal. By October he was followed at podiatry at the [facility] clinic noted to have still a wound measuring 5 x 2 mm on the left first metatarsal head also an area on the plantar right fifth met head. The right fifth met head eventually closed over. He required admission to the hospital from 11/01/2019 through 11/09/2019. An MRI showed bone marrow signals within the medial hallux sesamoid suspicious for osteomyelitis. Possible air tracking or gas-forming infection. He was taken to the OR for debridement by Dr. Caryl Comes. I believe operative cultures showed MSSA. He was seen by Dr. Steva Ready of infectious disease and he received oral Flagyl and IV Ancef. There was an improvement in his C-reactive protein. I believe he also received a course of Augmentin either by ID or Dr. Caryl Comes as recently as last month. He was seen by Dr. Vickki Muff at the [facility]  who they tell me is covering for Dr. Caryl Comes. He wondered about plastic surgery and referred him here for consideration of this. In truth we have not had plastic surgery here since 2017 as such I do not have any more access to plastic surgery consults then any other provider. That being said the patient's wound is completely necrotic today and I really do not think he is a candidate for plastic surgery. Both the patient and his wife asked me about proceeding with a ray amputation that had previously been apparently suggested by Dr. Caryl Comes and after looking at the wound I really could not disagree with this. The only risk of course is sufficient blood flow to heal a surgical area. Past medical history includes type 2 diabetes with peripheral neuropathy, hyperlipidemia and hypertension ABIs in our clinic were surprisingly good on the left at 1.03 Patient History Information obtained from Patient. Allergies No Known Allergies Family  History Cancer, Diabetes, Heart Disease - Father, Stroke - Father, No family history of Hereditary Spherocytosis, Hypertension, Kidney Disease, Lung Disease, Seizures, Thyroid Problems, Tuberculosis. Social History Never smoker, Marital Status - Married, Alcohol Use - Never, Drug Use - No History, Caffeine Use - Moderate - coffee. Medical History Eyes Denies history of Cataracts, Glaucoma, Optic Neuritis Ear/Nose/Mouth/Throat Denies history of Chronic sinus problems/congestion, Middle ear problems Hematologic/Lymphatic Denies history of Anemia, Hemophilia, Human Immunodeficiency Virus, Lymphedema, Sickle Cell Disease Respiratory Denies history of Aspiration, Asthma, Chronic Obstructive Pulmonary Disease (COPD), Pneumothorax, Sleep Apnea, Tuberculosis Cardiovascular Patient has history of Peripheral Arterial Disease Denies history of Angina, Arrhythmia, Congestive Heart Failure, Coronary Artery Disease, Deep Vein Thrombosis, Hypertension, Hypotension, Myocardial Infarction, Peripheral Venous Disease, Phlebitis, Vasculitis Gastrointestinal Denies history of Cirrhosis , Colitis, Crohnoos, Hepatitis A, Hepatitis B, Hepatitis C Endocrine Patient has history of Type II Diabetes Denies history of Type I Diabetes Genitourinary Denies history of End Stage Renal Disease Immunological Denies history of Lupus Erythematosus, Raynaudoos, Scleroderma Musculoskeletal Denies history of Gout, Rheumatoid Arthritis, Osteoarthritis, Osteomyelitis Neurologic Patient has history of Neuropathy Denies history of Dementia, Quadriplegia, Paraplegia, Seizure Disorder Oncologic Denies history of Received Chemotherapy, Received Radiation Psychiatric Denies history of Anorexia/bulimia, Confinement Anxiety Patient is treated with Oral Agents. Blood sugar is tested. Hospitalization/Surgery History - IandD x 2 10/2019. - left lower extremity angiography. - shoulder surgery. Medical A Surgical History  Notes nd Musculoskeletal degenerative joing disease Review of Systems (ROS) Constitutional Symptoms (General Health) Denies complaints or symptoms of Fatigue, Fever, Chills, Marked Weight Change. Eyes Denies complaints or symptoms of Dry Eyes, Vision Changes, Glasses / Contacts. Ear/Nose/Mouth/Throat Denies complaints or symptoms of Chronic sinus problems or rhinitis. Respiratory Denies complaints or symptoms of Chronic or frequent coughs, Shortness of Breath. Cardiovascular Denies complaints or symptoms of Chest pain. Gastrointestinal Denies complaints or symptoms of Frequent diarrhea, Nausea, Vomiting. Endocrine Denies complaints or symptoms of Heat/cold intolerance. Genitourinary Denies complaints or symptoms of Frequent urination. Integumentary (Skin) Complains or has symptoms of Wounds. Musculoskeletal Denies complaints or symptoms of Muscle Pain, Muscle Weakness. Neurologic Denies complaints or symptoms of Numbness/parasthesias. Psychiatric Denies complaints or symptoms of Claustrophobia, Suicidal. Objective Constitutional Sitting or standing Blood Pressure is within target range for patient.. Pulse regular and within target range for patient.Marland Kitchen Respirations regular, non-labored and within target range.. Temperature is normal and within the target range for the patient.Marland Kitchen Appears in no distress. Vitals Time Taken: 3:32 PM, Height: 77 in, Source: Stated, Weight: 225 lbs, Source: Stated, BMI: 26.7, Temperature: 98.3 F, Pulse: 76 bpm, Respiratory Rate: 17 breaths/min, Blood Pressure:  140/87 mmHg, Capillary Blood Glucose: 90 mg/dl. Respiratory work of breathing is normal. Cardiovascular Pedal pulses at the dorsalis pedis is palpable also the posterior tibial but certainly reduced. General Notes: Wound exam ooThe area in question is on the left medial first metatarsal head. Completely necrotic surface. I used pickups and a 15 scalpel to remove some of this however underneath  this is almost all exposed bone. There is undermining laterally towards the mid part of the foot of several centimeters. I did a culture of what looked to be some purulent drainage in the undermining area. There is absolutely no viable tissue that I can see probably exposed tendon medially. ooHe also has an area on the lateral part of the left fifth met head which he thinks was from walking on the outside of his foot. This is covered with necrotic surface I did not debride this Integumentary (Hair, Skin) No obvious erythema around any of the wounds. Wound #1 status is Open. Original cause of wound was Gradually Appeared. The date acquired was: 07/11/2019. The wound is located on the Left,Plantar Metatarsal head first. The wound measures 4.5cm length x 2.9cm width x 1.7cm depth; 10.249cm^2 area and 17.424cm^3 volume. Tunneling has been noted at 4:00 with a maximum distance of 3.4cm. Undermining begins at 12:00 and ends at 4:00 with a maximum distance of 2cm. There is a medium amount of purulent drainage noted. The wound margin is distinct with the outline attached to the wound base. There is no granulation within the wound bed. There is a large (67-100%) amount of necrotic tissue within the wound bed including Adherent Slough. Wound #2 status is Open. Original cause of wound was Pressure Injury. The date acquired was: 02/14/2020. The wound is located on the Left,Lateral Foot. The wound measures 2.4cm length x 1.2cm width x 0.1cm depth; 2.262cm^2 area and 0.226cm^3 volume. There is no tunneling or undermining noted. There is a medium amount of serosanguineous drainage noted. The wound margin is distinct with the outline attached to the wound base. There is no granulation within the wound bed. There is a large (67-100%) amount of necrotic tissue within the wound bed including Eschar. Assessment Active Problems ICD-10 Type 2 diabetes mellitus with foot ulcer Non-pressure chronic ulcer of other part of  left foot with necrosis of bone Non-pressure chronic ulcer of other part of left foot with other specified severity Type 2 diabetes mellitus with diabetic peripheral angiopathy without gangrene Other chronic osteomyelitis, right ankle and foot Procedures Wound #1 Pre-procedure diagnosis of Wound #1 is a Diabetic Wound/Ulcer of the Lower Extremity located on the Left,Plantar Metatarsal head first .Severity of Tissue Pre Debridement is: Fat layer exposed. There was a Excisional Skin/Subcutaneous Tissue Debridement with a total area of 13.05 sq cm performed by Ricard Dillon., MD. With the following instrument(s): Curette to remove Viable and Non-Viable tissue/material. Material removed includes Callus and Subcutaneous Tissue and. No specimens were taken. A time out was conducted at 16:44, prior to the start of the procedure. A Moderate amount of bleeding was controlled with Pressure. The procedure was tolerated well with a pain level of 0 throughout and a pain level of 0 following the procedure. Post Debridement Measurements: 4.5cm length x 2.9cm width x 1.7cm depth; 17.424cm^3 volume. Character of Wound/Ulcer Post Debridement is stable. Severity of Tissue Post Debridement is: Fat layer exposed. Post procedure Diagnosis Wound #1: Same as Pre-Procedure Wound #2 Pre-procedure diagnosis of Wound #2 is a Pressure Ulcer located on the Left,Lateral Foot . There  was a Chemical/Enzymatic/Mechanical debridement performed by Levan Hurst, RN.. Other agent used was anasept and gauze. There was no bleeding. The procedure was tolerated well with a pain level of 0 throughout and a pain level of 0 following the procedure. Post Debridement Measurements: 2.4cm length x 1.2cm width x 0.1cm depth; 0.226cm^3 volume. Post debridement Stage noted as Unstageable/Unclassified. Character of Wound/Ulcer Post Debridement is improved. Post procedure Diagnosis Wound #2: Same as Pre-Procedure Plan Follow-up  Appointments: Return Appointment in 2 weeks. Bathing/ Shower/ Hygiene: May shower with protection but do not get wound dressing(s) wet. Services and Therapies ordered were: Ankle Brachial Index (ABI) w/TBIs and arterial dopplers - [facility] - non healing wounds on left foot - hx of PAD Laboratory ordered were: Anerobic culture - Left first metatarsal WOUND #1: - Metatarsal head first Wound Laterality: Plantar, Left Cleanser: Normal Saline (DME) (Generic) Every Other Day/30 Days Discharge Instructions: Cleanse the wound with Normal Saline prior to applying a clean dressing using gauze sponges, not tissue or cotton balls. Cleanser: Soap and Water Every Other Day/30 Days Discharge Instructions: May shower and wash wound with dial antibacterial soap and water prior to dressing change. Prim Dressing: KerraCel Ag Gelling Fiber Dressing, 2x2 in (silver alginate) (DME) (Generic) Every Other Day/30 Days ary Discharge Instructions: Apply silver alginate to wound bed as instructed Secondary Dressing: Woven Gauze Sponge, Non-Sterile 4x4 in (DME) (Generic) Every Other Day/30 Days Discharge Instructions: Apply over primary dressing as directed. Secured With: [employer], 4.5x3.1 (in/yd) Every Other Day/30 Days Discharge Instructions: Secure with Kerlix as directed. Secured With: Paper T ape, 2x10 (in/yd) (DME) (Generic) Every Other Day/30 Days Discharge Instructions: Secure dressing with tape as directed. WOUND #2: - Foot Wound Laterality: Left, Lateral Cleanser: Normal Saline (DME) (Generic) Every Other Day/30 Days Discharge Instructions: Cleanse the wound with Normal Saline prior to applying a clean dressing using gauze sponges, not tissue or cotton balls. Cleanser: Soap and Water Every Other Day/30 Days Discharge Instructions: May shower and wash wound with dial antibacterial soap and water prior to dressing change. Prim Dressing: KerraCel Ag Gelling Fiber Dressing, 2x2 in  (silver alginate) (DME) (Generic) Every Other Day/30 Days ary Discharge Instructions: Apply silver alginate to wound bed as instructed Secondary Dressing: Woven Gauze Sponge, Non-Sterile 4x4 in (DME) (Generic) Every Other Day/30 Days Discharge Instructions: Apply over primary dressing as directed. Secured With: [employer], 4.5x3.1 (in/yd) Every Other Day/30 Days Discharge Instructions: Secure with Kerlix as directed. Secured With: Paper T ape, 2x10 (in/yd) (DME) (Generic) Every Other Day/30 Days Discharge Instructions: Secure dressing with tape as directed. 1. Patient arrived with a completely nonviable wound on the left first metatarsal head I debrided this however there is no viable tissue this goes right down to bone medial undermining of several centimeters culture done. 2. Although his pulses were palpable and his ABI was normal in our clinic I think it is probably worthwhile rechecking his formal arterial studies at Dr. Nino Parsley office. He has had revascularization of the small tibial vessels which sometimes will reocclude. 3. Actually the patient and his wife brought up the ray amputation. Truthfully I think this is probably the best option. I think this wound would be a risk of further breakdown and infection that could spread into other parts of his foot 4. We will use silver alginate in both wound areas. They are going to call Dr. Caryl Comes or Vickki Muff tomorrow to see if they can get on the list I will try to put  in a call to them as well. 5. The 2 things that I ordered are the follow-up arterial studies with Dr. Delana Meyer and the culture I will follow up on both of these. I did not give him empiric antibiotics I spent 35 minutes in review of this patient's past medical history, face-to-face evaluation and preparation of this record Electronic Signature(s) Signed: 03/02/2020 5:55:48 PM By: Linton Ham MD Entered By: Linton Ham on 03/02/2020  17:20:50 -------------------------------------------------------------------------------- HxROS Details Patient Name: Date of Service: Reginald Nielsen 03/02/2020 2:45 PM Medical Record Number: [id number] Patient Account Number: [id number] Date of Birth/Sex: Treating RN: 11/29/1960 (60 y.o. Male) Rhae Hammock Primary Care Provider: Denice Paradise NES, DEA NNA Other Clinician: Referring Provider: Treating Provider/Extender: Dickie La in Treatment: 0 Information Obtained From Patient Constitutional Symptoms (General Health) Complaints and Symptoms: Negative for: Fatigue; Fever; Chills; Marked Weight Change Eyes Complaints and Symptoms: Negative for: Dry Eyes; Vision Changes; Glasses / Contacts Medical History: Negative for: Cataracts; Glaucoma; Optic Neuritis Ear/Nose/Mouth/Throat Complaints and Symptoms: Negative for: Chronic sinus problems or rhinitis Medical History: Negative for: Chronic sinus problems/congestion; Middle ear problems Respiratory Complaints and Symptoms: Negative for: Chronic or frequent coughs; Shortness of Breath Medical History: Negative for: Aspiration; Asthma; Chronic Obstructive Pulmonary Disease (COPD); Pneumothorax; Sleep Apnea; Tuberculosis Cardiovascular Complaints and Symptoms: Negative for: Chest pain Medical History: Positive for: Peripheral Arterial Disease Negative for: Angina; Arrhythmia; Congestive Heart Failure; Coronary Artery Disease; Deep Vein Thrombosis; Hypertension; Hypotension; Myocardial Infarction; Peripheral Venous Disease; Phlebitis; Vasculitis Gastrointestinal Complaints and Symptoms: Negative for: Frequent diarrhea; Nausea; Vomiting Medical History: Negative for: Cirrhosis ; Colitis; Crohns; Hepatitis A; Hepatitis B; Hepatitis C Endocrine Complaints and Symptoms: Negative for: Heat/cold intolerance Medical History: Positive for: Type II Diabetes Negative for: Type I Diabetes Time with diabetes: 10  year Treated with: Oral agents Blood sugar tested every day: Yes Tested : Genitourinary Complaints and Symptoms: Negative for: Frequent urination Medical History: Negative for: End Stage Renal Disease Integumentary (Skin) Complaints and Symptoms: Positive for: Wounds Musculoskeletal Complaints and Symptoms: Negative for: Muscle Pain; Muscle Weakness Medical History: Negative for: Gout; Rheumatoid Arthritis; Osteoarthritis; Osteomyelitis Past Medical History Notes: degenerative joing disease Neurologic Complaints and Symptoms: Negative for: Numbness/parasthesias Medical History: Positive for: Neuropathy Negative for: Dementia; Quadriplegia; Paraplegia; Seizure Disorder Psychiatric Complaints and Symptoms: Negative for: Claustrophobia; Suicidal Medical History: Negative for: Anorexia/bulimia; Confinement Anxiety Hematologic/Lymphatic Medical History: Negative for: Anemia; Hemophilia; Human Immunodeficiency Virus; Lymphedema; Sickle Cell Disease Immunological Medical History: Negative for: Lupus Erythematosus; Raynauds; Scleroderma Oncologic Medical History: Negative for: Received Chemotherapy; Received Radiation Immunizations Pneumococcal Vaccine: Received Pneumococcal Vaccination: Yes Implantable Devices None Hospitalization / Surgery History Type of Hospitalization/Surgery IandD x 2 10/2019 left lower extremity angiography shoulder surgery Family and Social History Cancer: Yes; Diabetes: Yes; Heart Disease: Yes - Father; Hereditary Spherocytosis: No; Hypertension: No; Kidney Disease: No; Lung Disease: No; Seizures: No; Stroke: Yes - Father; Thyroid Problems: No; Tuberculosis: No; Never smoker; Marital Status - Married; Alcohol Use: Never; Drug Use: No History; Caffeine Use: Moderate - coffee; Financial Concerns: No; Food, Clothing or Shelter Needs: No; Support System Lacking: No; Transportation Concerns: No Engineer, maintenance) Signed: 03/02/2020 5:55:48 PM  By: Linton Ham MD Signed: 03/03/2020 5:43:17 PM By: Rhae Hammock RN Entered By: Rhae Hammock on 03/02/2020 15:45:41 -------------------------------------------------------------------------------- SuperBill Details Patient Name: Date of Service: Reginald Nielsen 03/02/2020 Medical Record Number: [id number] Patient Account Number: [id number] Date of Birth/Sex: Treating RN: Jan 03, 1961 (60 y.o. 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, left ankle and foot Facility Procedures CPT4 Code: 16606301 Description: [place] VISIT-LEV 3 EST PT Modifier: 25 Quantity: 1 CPT4 Code: 60109323 Description: 55732 - DEBRIDE W/O ANES NON SELECT Modifier: Quantity: 1 CPT4 Code: 20254270 Description: [facility] - DEB SUBQ TISSUE 20 SQ CM/< ICD-10 Diagnosis Description L97.524 Non-pressure chronic ulcer of other part of left foot with necrosis of bone Modifier: Quantity: 1 Physician Procedures : CPT4 Code Description Modifier [id number] WC PHYS LEVEL 3 NEW PT 25 ICD-10 Diagnosis Description L97.524 Non-pressure chronic ulcer of other part of left foot with necrosis of bone L97.528 Non-pressure chronic ulcer of other part of left foot with other  specified severity E11.621 Type 2 diabetes mellitus with foot ulcer Quantity: 1 : [id number] - WC PHYS SUBQ TISS 20 SQ CM 1 ICD-10 Diagnosis Description L97.524 Non-pressure chronic ulcer of other part of left foot with  necrosis of bone Quantity: Electronic Signature(s) Signed: 03/02/2020 5:41:15 PM By: Levan Hurst RN, BSN Signed: 03/02/2020 5:55:48 PM By: Linton Ham MD Entered By: Levan Hurst on 03/02/2020 17:29:35

## 2020-03-04 ENCOUNTER — Ambulatory Visit: Payer: PPO | Admitting: Family Medicine

## 2020-03-04 DIAGNOSIS — I87312 Chronic venous hypertension (idiopathic) with ulcer of left lower extremity: Secondary | ICD-10-CM | POA: Diagnosis not present

## 2020-03-06 ENCOUNTER — Ambulatory Visit (INDEPENDENT_AMBULATORY_CARE_PROVIDER_SITE_OTHER): Payer: PPO

## 2020-03-06 ENCOUNTER — Encounter (INDEPENDENT_AMBULATORY_CARE_PROVIDER_SITE_OTHER): Payer: Self-pay | Admitting: Nurse Practitioner

## 2020-03-06 ENCOUNTER — Ambulatory Visit (INDEPENDENT_AMBULATORY_CARE_PROVIDER_SITE_OTHER): Payer: PPO | Admitting: Nurse Practitioner

## 2020-03-06 ENCOUNTER — Other Ambulatory Visit: Payer: Self-pay

## 2020-03-06 VITALS — BP 146/88 | HR 73 | Ht 77.0 in | Wt 227.0 lb

## 2020-03-06 DIAGNOSIS — I739 Peripheral vascular disease, unspecified: Secondary | ICD-10-CM

## 2020-03-06 DIAGNOSIS — I10 Essential (primary) hypertension: Secondary | ICD-10-CM

## 2020-03-06 DIAGNOSIS — Z9862 Peripheral vascular angioplasty status: Secondary | ICD-10-CM

## 2020-03-06 DIAGNOSIS — E1151 Type 2 diabetes mellitus with diabetic peripheral angiopathy without gangrene: Secondary | ICD-10-CM | POA: Diagnosis not present

## 2020-03-06 DIAGNOSIS — E785 Hyperlipidemia, unspecified: Secondary | ICD-10-CM

## 2020-03-06 NOTE — Progress Notes (Signed)
Subjective:    Patient ID: Reginald Nielsen, male    DOB: 16-Oct-1960, 60 y.o.   MRN: [id number] Chief Complaint  Patient presents with  . Follow-up    6 Mo U/S    Reginald Nielsen is a 60 year old male that presents today for noninvasive studies.  The patient continues to have an ulceration on his left lower extremity.  The ulcerations on the plantar surface of his left foot as well as an open area on the lateral edge of his left foot.  The patient has previously underwent intervention to his left lower extremity in August and October 2021.  We have not seen the patient to follow-up since that time.  However the patient's wound has worsened since that time and per the patient the wound is unable to heal.  The patient has an upcoming appointment to discuss possible amputation.  Currently there is no infection present within the wound.  He denies any fever, chills, nausea, vomiting or diarrhea.  Today noninvasive show an ABI of 1.30 on the right and 1.12 on the left.  A left lower extremity arterial duplex shows biphasic/triphasic waveforms down to the level of the tibial arteries where the posterior tibial and peroneal are occluded with monophasic waveforms in the anterior tibial artery.   Review of Systems  Skin: Positive for wound.  All other systems reviewed and are negative.      Objective:   Physical Exam Vitals reviewed.  HENT:     Head: Normocephalic.  Cardiovascular:     Rate and Rhythm: Normal rate.     Pulses:          Dorsalis pedis pulses are detected w/ Doppler on the left side.       Posterior tibial pulses are detected w/ Doppler on the left side.  Pulmonary:     Effort: Pulmonary effort is normal.  Musculoskeletal:       Feet:  Feet:     Left foot:     Skin integrity: Ulcer and skin breakdown present.  Skin:    General: Skin is warm and dry.  Neurological:     Mental Status: He is alert and oriented to person, place, and time.  Psychiatric:        Mood and  Affect: Mood normal.        Behavior: Behavior normal.        Thought Content: Thought content normal.        Judgment: Judgment normal.     BP (!) 146/88   Pulse 73   Ht '6\' 5"'  (1.956 m)   Wt 227 lb (103 kg)   BMI 26.92 kg/m   Past Medical History:  Diagnosis Date  . Diabetes mellitus without complication ([place])    type 2  . Glaucoma   . Hyperlipidemia   . Hypertension   . Shoulder pain, left     Social History   Socioeconomic History  . Marital status: Married    Spouse name: Not on file  . Number of children: 1  . Years of education: Not on file  . Highest education level: High school graduate  Occupational History  . Occupation: retired    Comment: truck Geophysicist/field seismologist  Tobacco Use  . Smoking status: Never Smoker  . Smokeless tobacco: Current User    Types: Chew  Vaping Use  . Vaping Use: Never used  Substance and Sexual Activity  . Alcohol use: Not Currently    Comment: quit 11/2017  . Drug  use: Not Currently  . Sexual activity: Not Currently  Other Topics Concern  . Not on file  Social History Narrative  . Not on file   Social Determinants of Health   Financial Resource Strain: Low Risk   . Difficulty of Paying Living Expenses: Not very hard  Food Insecurity: No Food Insecurity  . Worried About Charity fundraiser in the Last Year: Never true  . Ran Out of Food in the Last Year: Never true  Transportation Needs: No Transportation Needs  . Lack of Transportation (Medical): No  . Lack of Transportation (Non-Medical): No  Physical Activity: Insufficiently Active  . Days of Exercise per Week: 4 days  . Minutes of Exercise per Session: 30 min  Stress: No Stress Concern Present  . Feeling of Stress : Only a little  Social Connections: Unknown  . Frequency of Communication with Friends and Family: Patient refused  . Frequency of Social Gatherings with Friends and Family: Patient refused  . Attends Religious Services: Patient refused  . Active Member of Clubs  or Organizations: Patient refused  . Attends Archivist Meetings: Patient refused  . Marital Status: Married  Human resources officer Violence: Not At Risk  . Fear of Current or Ex-Partner: No  . Emotionally Abused: No  . Physically Abused: No  . Sexually Abused: No    Past Surgical History:  Procedure Laterality Date  . COLONOSCOPY WITH PROPOFOL N/A 04/08/2019   Procedure: COLONOSCOPY WITH PROPOFOL;  Surgeon: Lucilla Lame, MD;  Location: [facility];  Service: Endoscopy;  Laterality: N/A;  . frozen shoulder    . IRRIGATION AND DEBRIDEMENT FOOT Left 11/02/2019   Procedure: IRRIGATION AND DEBRIDEMENT FOOT;  Surgeon: Sharlotte Alamo, DPM;  Location: [facility];  Service: Podiatry;  Laterality: Left;  . IRRIGATION AND DEBRIDEMENT FOOT Left 11/06/2019   Procedure: IRRIGATION AND DEBRIDEMENT FOOT;  Surgeon: Sharlotte Alamo, DPM;  Location: [facility];  Service: Podiatry;  Laterality: Left;  . LOWER EXTREMITY ANGIOGRAPHY Left 09/03/2019   Procedure: LOWER EXTREMITY ANGIOGRAPHY;  Surgeon: Katha Cabal, MD;  Location: [facility];  Service: Cardiovascular;  Laterality: Left;  . LOWER EXTREMITY ANGIOGRAPHY Left 11/05/2019   Procedure: Lower Extremity Angiography;  Surgeon: Katha Cabal, MD;  Location: [facility];  Service: Cardiovascular;  Laterality: Left;  . SHOULDER SURGERY     2015 and 2016    Family History  Problem Relation Age of Onset  . Diabetes Father   . Cancer Father        lung  . COPD Father   . Heart attack Father   . Stroke Father   . Diabetes Paternal Aunt     No Known Allergies  CBC Latest Ref Rng & Units 11/09/2019 11/07/2019 11/06/2019  WBC 4.0 - 10.5 K/uL 7.4 7.0 7.2  Hemoglobin 13.0 - 17.0 g/dL 13.5 13.6 13.5  Hematocrit 39.0 - 52.0 % 40.9 41.6 41.3  Platelets 150 - 400 K/uL 254 210 227      CMP     Component Value Date/Time   NA 136 11/09/2019 0510   NA 140 02/13/2019 0953   K 3.9 11/09/2019 0510   CL 103 11/09/2019 0510    CO2 26 11/09/2019 0510   GLUCOSE 144 (H) 11/09/2019 0510   BUN 19 11/09/2019 0510   BUN 15 02/13/2019 0953   CREATININE 0.81 11/09/2019 0510   CALCIUM 8.9 11/09/2019 0510   PROT 8.1 11/01/2019 1142   ALBUMIN 3.8 11/01/2019 1142   ALBUMIN  4.5 02/13/2019 0953   AST 13 (L) 11/01/2019 1142   ALT 14 11/01/2019 1142   ALKPHOS 40 11/01/2019 1142   BILITOT 0.9 11/01/2019 1142   GFRNONAA >60 11/09/2019 0510   GFRAA >60 09/03/2019 0925     No results found.     Assessment & Plan:   1. PAD (peripheral artery disease) (HCC)  Recommend:  The patient has evidence of severe atherosclerotic changes of both lower extremities associated with ulceration and tissue loss of the foot.  This represents a limb threatening ischemia and places the patient at the risk for limb loss.  Patient should undergo angiography of the lower extremities with the hope for intervention for limb salvage.  The risks and benefits as well as the alternative therapies was discussed in detail with the patient.  All questions were answered.  Patient agrees to proceed with angiography.  The patient will follow up with me in the office after the procedure.    2. Hyperlipidemia, unspecified hyperlipidemia type Continue statin as ordered and reviewed, no changes at this time   3. Primary hypertension Continue antihypertensive medications as already ordered, these medications have been reviewed and there are no changes at this time.   4. Type 2 diabetes mellitus with diabetic peripheral angiopathy without gangrene, without long-term current use of insulin ([place]) Continue hypoglycemic medications as already ordered, these medications have been reviewed and there are no changes at this time.  Hgb A1C to be monitored as already arranged by primary service    Current Outpatient Medications on File Prior to Visit  Medication Sig Dispense Refill  . aspirin EC 81 MG tablet Take 81 mg by mouth daily.    Marland Kitchen atorvastatin  (LIPITOR) 10 MG tablet Take 1 tablet (10 mg total) by mouth daily. Additional refills per the patient's primary care 30 tablet 6  . Blood Glucose Monitoring Suppl (ONE TOUCH ULTRA 2) w/Device KIT     . clopidogrel (PLAVIX) 75 MG tablet TAKE 1 TABLET(75 MG) BY MOUTH DAILY 30 tablet 4  . COMBIGAN 0.2-0.5 % ophthalmic solution Place 1 drop into both eyes 2 (two) times daily.     . dorzolamide-timolol (COSOPT) 22.3-6.8 MG/ML ophthalmic solution 1 drop 2 (two) times daily.    . Lancets (ONETOUCH DELICA PLUS [id number]) MISC USE DAILY 100 each 1  . latanoprost (XALATAN) 0.005 % ophthalmic solution Place 1 drop into both eyes at bedtime.     . metFORMIN (GLUCOPHAGE) 500 MG tablet Take 1 tablet (500 mg total) by mouth 2 (two) times daily with a meal. 180 tablet 1  . ONETOUCH ULTRA test strip USE TO TEST BLOOD SUGAR ONCE DAILY 100 strip 1  . traZODone (DESYREL) 50 MG tablet Take 0.5-1 tablets (25-50 mg total) by mouth at bedtime as needed for sleep. 90 tablet 1   No current facility-administered medications on file prior to visit.    There are no Patient Instructions on file for this visit. No follow-ups on file.   Kris Hartmann, NP

## 2020-03-06 NOTE — H&P (View-Only) (Signed)
Subjective:    Patient ID: Reginald Nielsen, male    DOB: Feb 11, 1960, 60 y.o.   MRN: [id number] Chief Complaint  Patient presents with  . Follow-up    6 Mo U/S    Reginald Nielsen is a 60 year old male that presents today for noninvasive studies.  The patient continues to have an ulceration on his left lower extremity.  The ulcerations on the plantar surface of his left foot as well as an open area on the lateral edge of his left foot.  The patient has previously underwent intervention to his left lower extremity in August and October 2021.  We have not seen the patient to follow-up since that time.  However the patient's wound has worsened since that time and per the patient the wound is unable to heal.  The patient has an upcoming appointment to discuss possible amputation.  Currently there is no infection present within the wound.  He denies any fever, chills, nausea, vomiting or diarrhea.  Today noninvasive show an ABI of 1.30 on the right and 1.12 on the left.  A left lower extremity arterial duplex shows biphasic/triphasic waveforms down to the level of the tibial arteries where the posterior tibial and peroneal are occluded with monophasic waveforms in the anterior tibial artery.   Review of Systems  Skin: Positive for wound.  All other systems reviewed and are negative.      Objective:   Physical Exam Vitals reviewed.  HENT:     Head: Normocephalic.  Cardiovascular:     Rate and Rhythm: Normal rate.     Pulses:          Dorsalis pedis pulses are detected w/ Doppler on the left side.       Posterior tibial pulses are detected w/ Doppler on the left side.  Pulmonary:     Effort: Pulmonary effort is normal.  Musculoskeletal:       Feet:  Feet:     Left foot:     Skin integrity: Ulcer and skin breakdown present.  Skin:    General: Skin is warm and dry.  Neurological:     Mental Status: He is alert and oriented to person, place, and time.  Psychiatric:        Mood and  Affect: Mood normal.        Behavior: Behavior normal.        Thought Content: Thought content normal.        Judgment: Judgment normal.     BP (!) 146/88   Pulse 73   Ht '6\' 5"'  (1.956 m)   Wt 227 lb (103 kg)   BMI 26.92 kg/m   Past Medical History:  Diagnosis Date  . Diabetes mellitus without complication ([place])    type 2  . Glaucoma   . Hyperlipidemia   . Hypertension   . Shoulder pain, left     Social History   Socioeconomic History  . Marital status: Married    Spouse name: Not on file  . Number of children: 1  . Years of education: Not on file  . Highest education level: High school graduate  Occupational History  . Occupation: retired    Comment: truck Geophysicist/field seismologist  Tobacco Use  . Smoking status: Never Smoker  . Smokeless tobacco: Current User    Types: Chew  Vaping Use  . Vaping Use: Never used  Substance and Sexual Activity  . Alcohol use: Not Currently    Comment: quit 11/2017  . Drug  use: Not Currently  . Sexual activity: Not Currently  Other Topics Concern  . Not on file  Social History Narrative  . Not on file   Social Determinants of Health   Financial Resource Strain: Low Risk   . Difficulty of Paying Living Expenses: Not very hard  Food Insecurity: No Food Insecurity  . Worried About Charity fundraiser in the Last Year: Never true  . Ran Out of Food in the Last Year: Never true  Transportation Needs: No Transportation Needs  . Lack of Transportation (Medical): No  . Lack of Transportation (Non-Medical): No  Physical Activity: Insufficiently Active  . Days of Exercise per Week: 4 days  . Minutes of Exercise per Session: 30 min  Stress: No Stress Concern Present  . Feeling of Stress : Only a little  Social Connections: Unknown  . Frequency of Communication with Friends and Family: Patient refused  . Frequency of Social Gatherings with Friends and Family: Patient refused  . Attends Religious Services: Patient refused  . Active Member of Clubs  or Organizations: Patient refused  . Attends Archivist Meetings: Patient refused  . Marital Status: Married  Human resources officer Violence: Not At Risk  . Fear of Current or Ex-Partner: No  . Emotionally Abused: No  . Physically Abused: No  . Sexually Abused: No    Past Surgical History:  Procedure Laterality Date  . COLONOSCOPY WITH PROPOFOL N/A 04/08/2019   Procedure: COLONOSCOPY WITH PROPOFOL;  Surgeon: Lucilla Lame, MD;  Location: [facility];  Service: Endoscopy;  Laterality: N/A;  . frozen shoulder    . IRRIGATION AND DEBRIDEMENT FOOT Left 11/02/2019   Procedure: IRRIGATION AND DEBRIDEMENT FOOT;  Surgeon: Sharlotte Alamo, DPM;  Location: [facility];  Service: Podiatry;  Laterality: Left;  . IRRIGATION AND DEBRIDEMENT FOOT Left 11/06/2019   Procedure: IRRIGATION AND DEBRIDEMENT FOOT;  Surgeon: Sharlotte Alamo, DPM;  Location: [facility];  Service: Podiatry;  Laterality: Left;  . LOWER EXTREMITY ANGIOGRAPHY Left 09/03/2019   Procedure: LOWER EXTREMITY ANGIOGRAPHY;  Surgeon: Katha Cabal, MD;  Location: [facility];  Service: Cardiovascular;  Laterality: Left;  . LOWER EXTREMITY ANGIOGRAPHY Left 11/05/2019   Procedure: Lower Extremity Angiography;  Surgeon: Katha Cabal, MD;  Location: [facility];  Service: Cardiovascular;  Laterality: Left;  . SHOULDER SURGERY     2015 and 2016    Family History  Problem Relation Age of Onset  . Diabetes Father   . Cancer Father        lung  . COPD Father   . Heart attack Father   . Stroke Father   . Diabetes Paternal Aunt     No Known Allergies  CBC Latest Ref Rng & Units 11/09/2019 11/07/2019 11/06/2019  WBC 4.0 - 10.5 K/uL 7.4 7.0 7.2  Hemoglobin 13.0 - 17.0 g/dL 13.5 13.6 13.5  Hematocrit 39.0 - 52.0 % 40.9 41.6 41.3  Platelets 150 - 400 K/uL 254 210 227      CMP     Component Value Date/Time   NA 136 11/09/2019 0510   NA 140 02/13/2019 0953   K 3.9 11/09/2019 0510   CL 103 11/09/2019 0510    CO2 26 11/09/2019 0510   GLUCOSE 144 (H) 11/09/2019 0510   BUN 19 11/09/2019 0510   BUN 15 02/13/2019 0953   CREATININE 0.81 11/09/2019 0510   CALCIUM 8.9 11/09/2019 0510   PROT 8.1 11/01/2019 1142   ALBUMIN 3.8 11/01/2019 1142   ALBUMIN  4.5 02/13/2019 0953   AST 13 (L) 11/01/2019 1142   ALT 14 11/01/2019 1142   ALKPHOS 40 11/01/2019 1142   BILITOT 0.9 11/01/2019 1142   GFRNONAA >60 11/09/2019 0510   GFRAA >60 09/03/2019 0925     No results found.     Assessment & Plan:   1. PAD (peripheral artery disease) (HCC)  Recommend:  The patient has evidence of severe atherosclerotic changes of both lower extremities associated with ulceration and tissue loss of the foot.  This represents a limb threatening ischemia and places the patient at the risk for limb loss.  Patient should undergo angiography of the lower extremities with the hope for intervention for limb salvage.  The risks and benefits as well as the alternative therapies was discussed in detail with the patient.  All questions were answered.  Patient agrees to proceed with angiography.  The patient will follow up with me in the office after the procedure.    2. Hyperlipidemia, unspecified hyperlipidemia type Continue statin as ordered and reviewed, no changes at this time   3. Primary hypertension Continue antihypertensive medications as already ordered, these medications have been reviewed and there are no changes at this time.   4. Type 2 diabetes mellitus with diabetic peripheral angiopathy without gangrene, without long-term current use of insulin ([place]) Continue hypoglycemic medications as already ordered, these medications have been reviewed and there are no changes at this time.  Hgb A1C to be monitored as already arranged by primary service    Current Outpatient Medications on File Prior to Visit  Medication Sig Dispense Refill  . aspirin EC 81 MG tablet Take 81 mg by mouth daily.    Marland Kitchen atorvastatin  (LIPITOR) 10 MG tablet Take 1 tablet (10 mg total) by mouth daily. Additional refills per the patient's primary care 30 tablet 6  . Blood Glucose Monitoring Suppl (ONE TOUCH ULTRA 2) w/Device KIT     . clopidogrel (PLAVIX) 75 MG tablet TAKE 1 TABLET(75 MG) BY MOUTH DAILY 30 tablet 4  . COMBIGAN 0.2-0.5 % ophthalmic solution Place 1 drop into both eyes 2 (two) times daily.     . dorzolamide-timolol (COSOPT) 22.3-6.8 MG/ML ophthalmic solution 1 drop 2 (two) times daily.    . Lancets (ONETOUCH DELICA PLUS [id number]) MISC USE DAILY 100 each 1  . latanoprost (XALATAN) 0.005 % ophthalmic solution Place 1 drop into both eyes at bedtime.     . metFORMIN (GLUCOPHAGE) 500 MG tablet Take 1 tablet (500 mg total) by mouth 2 (two) times daily with a meal. 180 tablet 1  . ONETOUCH ULTRA test strip USE TO TEST BLOOD SUGAR ONCE DAILY 100 strip 1  . traZODone (DESYREL) 50 MG tablet Take 0.5-1 tablets (25-50 mg total) by mouth at bedtime as needed for sleep. 90 tablet 1   No current facility-administered medications on file prior to visit.    There are no Patient Instructions on file for this visit. No follow-ups on file.   Kris Hartmann, NP

## 2020-03-08 LAB — AEROBIC/ANAEROBIC CULTURE W GRAM STAIN (SURGICAL/DEEP WOUND)

## 2020-03-09 ENCOUNTER — Other Ambulatory Visit: Payer: Self-pay

## 2020-03-09 ENCOUNTER — Telehealth (INDEPENDENT_AMBULATORY_CARE_PROVIDER_SITE_OTHER): Payer: Self-pay

## 2020-03-09 ENCOUNTER — Telehealth: Payer: Self-pay | Admitting: Family Medicine

## 2020-03-09 DIAGNOSIS — G629 Polyneuropathy, unspecified: Secondary | ICD-10-CM

## 2020-03-09 DIAGNOSIS — I739 Peripheral vascular disease, unspecified: Secondary | ICD-10-CM | POA: Diagnosis not present

## 2020-03-09 DIAGNOSIS — E1140 Type 2 diabetes mellitus with diabetic neuropathy, unspecified: Secondary | ICD-10-CM | POA: Diagnosis not present

## 2020-03-09 DIAGNOSIS — L97523 Non-pressure chronic ulcer of other part of left foot with necrosis of muscle: Secondary | ICD-10-CM | POA: Diagnosis not present

## 2020-03-09 MED ORDER — GABAPENTIN 100 MG PO CAPS: 100.0000 mg | ORAL_CAPSULE | Freq: Every day | ORAL | 0 refills | Status: DC

## 2020-03-09 NOTE — Telephone Encounter (Signed)
Pt is calling to speak to Delice Bison to ask if he can go back onto glipizide. Pt had a fried egg this morning and he noticed that his sugar went up to 135. Normally with his glipizide he could get eggs and toast and it not be that high.   Pt reports that he is having problems with his left left leg and foot. When he was in the hospital the doctor put him on gabapentin.  He was told that the only person that can prescribe the gabapentin his is PCP. Patient would like to know if Dr. Yetta Barre could write him a script for gabapentin for neuropathy.    Please advise CB- [phone] Preferred Pharmacy- [facility], [place].

## 2020-03-09 NOTE — Progress Notes (Unsigned)
Sent in gabapentin

## 2020-03-09 NOTE — Telephone Encounter (Signed)
Called pt- BS acceptable at 135. Make sure he is checking BS 2 hours after meal or fasting before breakfast. Also sent in Gabapentin 100mg  qhs- will need to see if doesn't help. I had to leave a message on vm

## 2020-03-09 NOTE — Telephone Encounter (Signed)
I attempted to contact the patient to give him information regarding his LLE angio on 03/24/20 with Dr. Gilda Crease at [facility] with a 11:00 am arrival time. Covid testing on 03/20/20 between 8-1 pm at the [facility]. A message was left for a return call.

## 2020-03-10 ENCOUNTER — Other Ambulatory Visit: Payer: Self-pay | Admitting: Podiatry

## 2020-03-10 ENCOUNTER — Telehealth: Payer: Self-pay

## 2020-03-10 DIAGNOSIS — I87312 Chronic venous hypertension (idiopathic) with ulcer of left lower extremity: Secondary | ICD-10-CM | POA: Diagnosis not present

## 2020-03-10 NOTE — Telephone Encounter (Signed)
Copied from CRM [id number]. Topic: General - Other >> Mar 10, 2020  1:34 PM Jaquita Rector A wrote: Reason for CRM: Patient wife called in to inquire of Dr Yetta Barre about putting him back on Glipizide would like a call back at  Ph# [phone]

## 2020-03-10 NOTE — Telephone Encounter (Signed)
Spoke to pt as well as spoke to nurse for podiatry. It is common for open wounds such as his to cause a little elevation in the BS. He needed to come her for clearance this week, so I sched him for Thursday and told him to come fasting so we can check his BS and poss A1C

## 2020-03-10 NOTE — Telephone Encounter (Signed)
Spoke with the patient and gave him the information regarding his LLE angio with Dr. Gilda Crease on 03/24/20 with a 11:00 am arrival time to the [facility]. Covid testing on 03/20/20 between 8-1 pm at the [facility]. Pre-procedure instructions were discussed and will be mailed.

## 2020-03-12 ENCOUNTER — Ambulatory Visit (INDEPENDENT_AMBULATORY_CARE_PROVIDER_SITE_OTHER): Payer: PPO | Admitting: Family Medicine

## 2020-03-12 ENCOUNTER — Other Ambulatory Visit: Payer: Self-pay

## 2020-03-12 ENCOUNTER — Encounter: Payer: Self-pay | Admitting: Family Medicine

## 2020-03-12 VITALS — BP 140/88 | HR 80 | Ht 77.0 in | Wt 222.0 lb

## 2020-03-12 DIAGNOSIS — Z01818 Encounter for other preprocedural examination: Secondary | ICD-10-CM

## 2020-03-12 DIAGNOSIS — I739 Peripheral vascular disease, unspecified: Secondary | ICD-10-CM | POA: Diagnosis not present

## 2020-03-12 DIAGNOSIS — E782 Mixed hyperlipidemia: Secondary | ICD-10-CM

## 2020-03-12 DIAGNOSIS — E1142 Type 2 diabetes mellitus with diabetic polyneuropathy: Secondary | ICD-10-CM

## 2020-03-12 DIAGNOSIS — G629 Polyneuropathy, unspecified: Secondary | ICD-10-CM | POA: Diagnosis not present

## 2020-03-12 MED ORDER — GABAPENTIN 100 MG PO CAPS: 100.0000 mg | ORAL_CAPSULE | Freq: Two times a day (BID) | ORAL | 0 refills | Status: DC

## 2020-03-12 NOTE — Progress Notes (Signed)
Date:  03/12/2020   Name:  Reginald Nielsen   DOB:  08/16/1960   MRN:  [id number]   Chief Complaint: Pre-op Exam and Diabetes  Patient is a 60 year old male who presents for a preoperative exam. The patient reports the following problems: diabetic foot. Health maintenance has been reviewed today   Diabetes He presents for his follow-up diabetic visit. He has type 2 diabetes mellitus. His disease course has been stable. There are no hypoglycemic associated symptoms. Pertinent negatives for hypoglycemia include no dizziness, headaches or nervousness/anxiousness. Associated symptoms include foot ulcerations. Pertinent negatives for diabetes include no blurred vision, no chest pain, no fatigue, no foot paresthesias, no polydipsia, no polyphagia, no polyuria, no visual change, no weakness and no weight loss. There are no hypoglycemic complications. Symptoms are stable. There are no diabetic complications. Current diabetic treatment includes oral agent (monotherapy). His weight is stable. He is following a generally healthy diet. Meal planning includes avoidance of concentrated sweets and carbohydrate counting. He participates in exercise intermittently. His home blood glucose trend is fluctuating minimally. (2hr pc "barely over 100")    Lab Results  Component Value Date   CREATININE 0.81 11/09/2019   BUN 19 11/09/2019   NA 136 11/09/2019   K 3.9 11/09/2019   CL 103 11/09/2019   CO2 26 11/09/2019   Lab Results  Component Value Date   CHOL 197 05/20/2019   HDL 43 05/20/2019   LDLCALC 121 (H) 05/20/2019   TRIG 188 (H) 05/20/2019   No results found for: TSH Lab Results  Component Value Date   HGBA1C 5.6 01/20/2020   Lab Results  Component Value Date   WBC 7.4 11/09/2019   HGB 13.5 11/09/2019   HCT 40.9 11/09/2019   MCV 83.5 11/09/2019   PLT 254 11/09/2019   Lab Results  Component Value Date   ALT 14 11/01/2019   AST 13 (L) 11/01/2019   ALKPHOS 40 11/01/2019   BILITOT 0.9  11/01/2019     Review of Systems  Constitutional: Negative for chills, fatigue, fever and weight loss.  HENT: Negative for drooling, ear discharge, ear pain and sore throat.   Eyes: Negative for blurred vision.  Respiratory: Negative for cough, shortness of breath and wheezing.   Cardiovascular: Negative for chest pain, palpitations and leg swelling.  Gastrointestinal: Negative for abdominal pain, blood in stool, constipation, diarrhea and nausea.  Endocrine: Negative for polydipsia, polyphagia and polyuria.  Genitourinary: Negative for dysuria, frequency, hematuria and urgency.  Musculoskeletal: Negative for back pain, myalgias and neck pain.  Skin: Negative for rash.  Allergic/Immunologic: Negative for environmental allergies.  Neurological: Negative for dizziness, weakness and headaches.  Hematological: Does not bruise/bleed easily.  Psychiatric/Behavioral: Negative for suicidal ideas. The patient is not nervous/anxious.     Patient Active Problem List   Diagnosis Date Noted  . Abscess of left foot   . Osteomyelitis ([place]) 11/02/2019  . Left foot infection 11/01/2019  . DM (diabetes mellitus), type 2 with peripheral vascular complications ([place]) 62/95/2841  . PAD (peripheral artery disease) ([place]) 11/01/2019  . HLD (hyperlipidemia) 11/01/2019  . Hypertension   . Diabetic ulcer of left midfoot associated with type 2 diabetes mellitus, with necrosis of muscle ([place])   . Atherosclerosis of native arteries of the extremities with ulceration ([place]) 08/26/2019  . Diabetes ([place]) 08/26/2019  . DJD (degenerative joint disease) 08/26/2019  . Special screening for malignant neoplasms, colon     No Known Allergies  Past Surgical History:  Procedure  Laterality Date  . COLONOSCOPY WITH PROPOFOL N/A 04/08/2019   Procedure: COLONOSCOPY WITH PROPOFOL;  Surgeon: Lucilla Lame, MD;  Location: [facility];  Service: Endoscopy;  Laterality: N/A;  . frozen shoulder    . IRRIGATION AND  DEBRIDEMENT FOOT Left 11/02/2019   Procedure: IRRIGATION AND DEBRIDEMENT FOOT;  Surgeon: Sharlotte Alamo, DPM;  Location: [facility];  Service: Podiatry;  Laterality: Left;  . IRRIGATION AND DEBRIDEMENT FOOT Left 11/06/2019   Procedure: IRRIGATION AND DEBRIDEMENT FOOT;  Surgeon: Sharlotte Alamo, DPM;  Location: [facility];  Service: Podiatry;  Laterality: Left;  . LOWER EXTREMITY ANGIOGRAPHY Left 09/03/2019   Procedure: LOWER EXTREMITY ANGIOGRAPHY;  Surgeon: Katha Cabal, MD;  Location: [facility];  Service: Cardiovascular;  Laterality: Left;  . LOWER EXTREMITY ANGIOGRAPHY Left 11/05/2019   Procedure: Lower Extremity Angiography;  Surgeon: Katha Cabal, MD;  Location: [facility];  Service: Cardiovascular;  Laterality: Left;  . SHOULDER SURGERY     2015 and 2016    Social History   Tobacco Use  . Smoking status: Never Smoker  . Smokeless tobacco: Current User    Types: Chew  Vaping Use  . Vaping Use: Never used  Substance Use Topics  . Alcohol use: Not Currently    Comment: quit 11/2017  . Drug use: Not Currently     Medication list has been reviewed and updated.  Current Meds  Medication Sig  . Ascorbic Acid (VITAMIN C) 1000 MG tablet Take 1,000 mg by mouth daily.  Marland Kitchen aspirin EC 81 MG tablet Take 81 mg by mouth daily.  Marland Kitchen aspirin-sod bicarb-citric acid (ALKA-SELTZER) 325 MG TBEF tablet Take 650 mg by mouth daily as needed (indigestion).  Marland Kitchen atorvastatin (LIPITOR) 10 MG tablet Take 1 tablet (10 mg total) by mouth daily. Additional refills per the patient's primary care  . b complex vitamins capsule Take 1 capsule by mouth daily.  . Blood Glucose Monitoring Suppl (ONE TOUCH ULTRA 2) w/Device KIT   . Brimonidine Tartrate (LUMIFY) 0.025 % SOLN Place 1 drop into both eyes daily.  . cefdinir (OMNICEF) 300 MG capsule Take 300 mg by mouth 2 (two) times daily.  . clopidogrel (PLAVIX) 75 MG tablet TAKE 1 TABLET(75 MG) BY MOUTH DAILY (Patient taking differently: Take 75 mg  by mouth daily.)  . Coenzyme Q10 (COQ-10) 100 MG CAPS Take 100 mg by mouth daily.  . dorzolamide-timolol (COSOPT) 22.3-6.8 MG/ML ophthalmic solution Place 1 drop into both eyes 2 (two) times daily.  . ferrous sulfate 325 (65 FE) MG tablet Take 325 mg by mouth daily.  Marland Kitchen gabapentin (NEURONTIN) 100 MG capsule Take 1 capsule (100 mg total) by mouth at bedtime.  . Lancets (ONETOUCH DELICA PLUS [id number]) MISC USE DAILY  . latanoprost (XALATAN) 0.005 % ophthalmic solution Place 1 drop into both eyes at bedtime.   . metFORMIN (GLUCOPHAGE) 500 MG tablet Take 1 tablet (500 mg total) by mouth 2 (two) times daily with a meal.  . neomycin-bacitracin-polymyxin (NEOSPORIN) ointment Apply 1 application topically as needed for wound care.  Glory Rosebush ULTRA test strip USE TO TEST BLOOD SUGAR ONCE DAILY  . traZODone (DESYREL) 50 MG tablet Take 0.5-1 tablets (25-50 mg total) by mouth at bedtime as needed for sleep.  . Turmeric Curcumin 500 MG CAPS Take 500 mg by mouth daily.    PHQ 2/9 Scores 11/01/2019 08/16/2019 05/22/2019 02/13/2019  PHQ - 2 Score 0 0 1 0  PHQ- 9 Score 0 3 6 0    GAD  7 : Generalized Anxiety Score 11/01/2019 08/16/2019 02/13/2019 12/31/2018  Nervous, Anxious, on Edge 0 1 0 1  Control/stop worrying 0 1 0 0  Worry too much - different things 0 1 0 0  Trouble relaxing 0 0 0 2  Restless 0 0 0 2  Easily annoyed or irritable 0 0 0 0  Afraid - awful might happen 0 1 0 0  Total GAD 7 Score 0 4 0 5  Anxiety Difficulty - Not difficult at all - Not difficult at all    BP Readings from Last 3 Encounters:  03/12/20 140/88  03/06/20 (!) 146/88  01/20/20 120/80    Physical Exam Vitals and nursing note reviewed.  HENT:     Head: Normocephalic.     Right Ear: Tympanic membrane and external ear normal.     Left Ear: Tympanic membrane and external ear normal.     Nose: Nose normal. No congestion or rhinorrhea.     Mouth/Throat:     Mouth: Oropharynx is clear and moist. Mucous membranes are moist.   Eyes:     General: No scleral icterus.       Right eye: No discharge.        Left eye: No discharge.     Extraocular Movements: EOM normal.     Conjunctiva/sclera: Conjunctivae normal.     Pupils: Pupils are equal, round, and reactive to light.  Neck:     Thyroid: No thyromegaly.     Vascular: No JVD.     Trachea: No tracheal deviation.  Cardiovascular:     Rate and Rhythm: Normal rate and regular rhythm.     Pulses: Intact distal pulses.     Heart sounds: Normal heart sounds. No murmur heard. No friction rub. No gallop.   Pulmonary:     Effort: No respiratory distress.     Breath sounds: Normal breath sounds. No wheezing, rhonchi or rales.  Abdominal:     General: Bowel sounds are normal.     Palpations: Abdomen is soft. There is no hepatosplenomegaly or mass.     Tenderness: There is no abdominal tenderness. There is no CVA tenderness, guarding or rebound.  Musculoskeletal:        General: No tenderness or edema. Normal range of motion.     Cervical back: Normal range of motion and neck supple.  Lymphadenopathy:     Cervical: No cervical adenopathy.  Skin:    General: Skin is warm.     Findings: No rash.  Neurological:     Mental Status: He is alert and oriented to person, place, and time.     Cranial Nerves: No cranial nerve deficit.     Deep Tendon Reflexes: Strength normal and reflexes are normal and symmetric.     Wt Readings from Last 3 Encounters:  03/12/20 222 lb (100.7 kg)  03/06/20 227 lb (103 kg)  01/20/20 218 lb (98.9 kg)    BP 140/88   Pulse 80   Ht '6\' 5"'  (1.956 m)   Wt 222 lb (100.7 kg)   BMI 26.33 kg/m   Assessment and Plan: 1. Preop examination Patient's chart was reviewed for previous encounters most recent labs most recent imaging and care everywhere. There was no concerning contraindications for surgery on history and physical exam today. EKG was done today with the following results:I have reviewed EKG which shows rate was 77 and the rhythm  is normal sinus rhythm. Intervals PR QT and QRS are all within normal limits there  is no LVH criteria by voltage noted at this time. There is a mild first-degree AV block that was noted but no bundle branch block. There was no evidence of ischemic changes such as Q waves ST-T wave changes nor delay in R wave progression.. Comparison to previous EKG dated 11/04/2019 with no significant changes. - HgB A1c - Renal Function Panel - CBC w/Diff/Platelet - INR/PT - EKG 12-Lead  2. Moderate mixed hyperlipidemia not requiring statin therapy Chronic.  Controlled.  Stable.  Will check lipid panel for current status. - Lipid Panel With LDL/HDL Ratio  3. Type 2 diabetes mellitus with diabetic polyneuropathy, without long-term current use of insulin ([place]) .  Controlled.  Stable.  Will check A1c and renal function panel. - HgB A1c - Renal Function Panel  4. PAD (peripheral artery disease) ([place]) .  Controlled.  Stable.  Is followed by Dr. Delana Meyer [facility]. - HgB A1c - Lipid Panel With LDL/HDL Ratio  5. Neuropathy Chronic.  Controlled.  Stable we will increase gabapentin 200 mg twice a day for control of neuropathy. - gabapentin (NEURONTIN) 100 MG capsule; Take 1 capsule (100 mg total) by mouth 2 (two) times daily.  Dispense: 60 capsule; Refill: 0

## 2020-03-13 ENCOUNTER — Other Ambulatory Visit: Payer: Self-pay

## 2020-03-13 DIAGNOSIS — E1142 Type 2 diabetes mellitus with diabetic polyneuropathy: Secondary | ICD-10-CM

## 2020-03-13 LAB — RENAL FUNCTION PANEL
Albumin: 4.5 g/dL (ref 3.8–4.9)
BUN/Creatinine Ratio: 17 (ref 9–20)
BUN: 15 mg/dL (ref 6–24)
CO2: 23 mmol/L (ref 20–29)
Calcium: 9.7 mg/dL (ref 8.7–10.2)
Chloride: 103 mmol/L (ref 96–106)
Creatinine, Ser: 0.87 mg/dL (ref 0.76–1.27)
Glucose: 168 mg/dL — ABNORMAL HIGH (ref 65–99)
Phosphorus: 3.1 mg/dL (ref 2.8–4.1)
Potassium: 4.8 mmol/L (ref 3.5–5.2)
Sodium: 143 mmol/L (ref 134–144)
eGFR: 100 mL/min/{1.73_m2} (ref >59)

## 2020-03-13 LAB — CBC WITH DIFFERENTIAL/PLATELET
Basophils Absolute: 0.1 10*3/uL (ref 0.0–0.2)
Basos: 1 %
EOS (ABSOLUTE): 0.2 10*3/uL (ref 0.0–0.4)
Eos: 3 %
Hematocrit: 49.3 % (ref 37.5–51.0)
Hemoglobin: 15.9 g/dL (ref 13.0–17.7)
Immature Grans (Abs): 0 10*3/uL (ref 0.0–0.1)
Immature Granulocytes: 0 %
Lymphocytes Absolute: 2 10*3/uL (ref 0.7–3.1)
Lymphs: 29 %
MCH: 27 pg (ref 26.6–33.0)
MCHC: 32.3 g/dL (ref 31.5–35.7)
MCV: 84 fL (ref 79–97)
Monocytes Absolute: 0.6 10*3/uL (ref 0.1–0.9)
Monocytes: 8 %
Neutrophils Absolute: 4.1 10*3/uL (ref 1.4–7.0)
Neutrophils: 59 %
Platelets: 226 10*3/uL (ref 150–450)
RBC: 5.89 x10E6/uL — ABNORMAL HIGH (ref 4.14–5.80)
RDW: 12.8 % (ref 11.6–15.4)
WBC: 7 10*3/uL (ref 3.4–10.8)

## 2020-03-13 LAB — LIPID PANEL WITH LDL/HDL RATIO
Cholesterol, Total: 141 mg/dL (ref 100–199)
HDL: 44 mg/dL (ref >39)
LDL Chol Calc (NIH): 81 mg/dL (ref 0–99)
LDL/HDL Ratio: 1.8 ratio (ref 0.0–3.6)
Triglycerides: 81 mg/dL (ref 0–149)
VLDL Cholesterol Cal: 16 mg/dL (ref 5–40)

## 2020-03-13 LAB — PROTIME-INR
INR: 1 (ref 0.9–1.2)
Prothrombin Time: 10.8 s (ref 9.1–12.0)

## 2020-03-13 LAB — HEMOGLOBIN A1C
Est. average glucose Bld gHb Est-mCnc: 143 mg/dL
Hgb A1c MFr Bld: 6.6 % — ABNORMAL HIGH (ref 4.8–5.6)

## 2020-03-13 MED ORDER — GLIPIZIDE 5 MG PO TABS
2.5000 mg | ORAL_TABLET | Freq: Every day | ORAL | 1 refills | Status: DC
Start: 2020-03-13 — End: 2020-05-19

## 2020-03-13 NOTE — Progress Notes (Signed)
Sent in glipizide 2.5mg 

## 2020-03-16 ENCOUNTER — Encounter (HOSPITAL_BASED_OUTPATIENT_CLINIC_OR_DEPARTMENT_OTHER): Payer: PPO | Admitting: Physician Assistant

## 2020-03-18 ENCOUNTER — Other Ambulatory Visit
Admission: RE | Admit: 2020-03-18 | Discharge: 2020-03-18 | Disposition: A | Discharge status: Home / Self Care | Payer: PPO | Source: Ambulatory Visit | Attending: Podiatry | Admitting: Podiatry

## 2020-03-18 ENCOUNTER — Other Ambulatory Visit: Payer: Self-pay

## 2020-03-18 HISTORY — DX: Other complications of anesthesia, initial encounter: T88.59XA

## 2020-03-18 NOTE — Patient Instructions (Signed)
Your procedure is scheduled on: Friday March 27, 2020. Report to Day Surgery inside [facility] 2nd floor (stop by Admissions desk first before getting on Elevator). To find out your arrival time please call [phone] between 1PM - 3PM on Thursday March 26, 2020.  Remember: Instructions that are not followed completely may result in serious medical risk,  up to and including death, or upon the discretion of your surgeon and anesthesiologist your  surgery may need to be rescheduled.     _X__ 1. Do not eat food after midnight the night before your procedure.                 No chewing gum or hard candies. You may drink clear liquids (water) up to 2 hours  __X__2.  On the morning of surgery brush your teeth with toothpaste and water, you                may rinse your mouth with mouthwash if you wish.  Do not swallow any toothpaste of mouthwash.     _X__ 3.  No Alcohol for 24 hours before or after surgery.   _X__ 4.  Do Not Smoke or use e-cigarettes For 24 Hours Prior to Your Surgery.                 Do not use any chewable tobacco products for at least 6 hours prior to                 Surgery.  _X__  5.  Do not use any recreational drugs (marijuana, cocaine, heroin, ecstasy, MDMA or other)                For at least one week prior to your surgery.  Combination of these drugs with anesthesia                May have life threatening results.  __X__ 6.  Notify your doctor if there is any change in your medical condition      (cold, fever, infections).     Do not wear jewelry, make-up, hairpins, clips or nail polish. Do not wear lotions, powders, or perfumes. You may wear deodorant. Do not shave 48 hours prior to surgery. Men may shave face and neck. Do not bring valuables to the hospital.    [facility] is not responsible for any belongings or valuables.  Contacts, dentures or bridgework may not be worn into surgery. Leave your suitcase in the car. After  surgery it may be brought to your room. For patients admitted to the hospital, discharge time is determined by your treatment team.   Patients discharged the day of surgery will not be allowed to drive home.   Make arrangements for someone to be with you for the first 24 hours of your Same Day Discharge.   __X__ Take these medicines the morning of surgery with A SIP OF WATER:    1. gabapentin (NEURONTIN) 100 MG  2. atorvastatin (LIPITOR) 10 MG  3.   4.  5.  6.  ____ Fleet Enema (as directed)   ____ Use CHG Soap (or wipes) as directed  ____ Use Benzoyl Peroxide Gel as instructed  ____ Use inhalers on the day of surgery  ____ Stop metformin 2 days prior to surgery Last dose March 12 before your procedure)   ____ Take 1/2 of usual insulin dose the night before surgery. No insulin the morning  of surgery.   __X__ Call your doctor to see when he wants you to stop Aspirin 81mg  and clopidogrel (PLAVIX) 75 MG  __X__ Stop Anti-inflammatories such as ibuprofen (ADVIL), Aleve, naproxen,    __X__ Stop supplements until after surgery. Turmeric Curcumin 500 MG, Coenzyme Q10 (COQ-10) 100 MG, Ascorbic Acid (VITAMIN C) 1000 MG  __X__ Do not start any herbal supplements before your procedure.    If you have any questions regarding your pre-procedure instructions,  Please call Pre-admit Testing at [phone].

## 2020-03-23 ENCOUNTER — Other Ambulatory Visit (INDEPENDENT_AMBULATORY_CARE_PROVIDER_SITE_OTHER): Payer: Self-pay | Admitting: Nurse Practitioner

## 2020-03-23 ENCOUNTER — Other Ambulatory Visit: Payer: Self-pay

## 2020-03-23 ENCOUNTER — Other Ambulatory Visit
Admission: RE | Admit: 2020-03-23 | Discharge: 2020-03-23 | Disposition: A | Discharge status: Home / Self Care | Payer: PPO | Source: Ambulatory Visit | Attending: Podiatry | Admitting: Podiatry

## 2020-03-23 DIAGNOSIS — Z20822 Contact with and (suspected) exposure to covid-19: Secondary | ICD-10-CM | POA: Insufficient documentation

## 2020-03-23 DIAGNOSIS — Z01812 Encounter for preprocedural laboratory examination: Secondary | ICD-10-CM | POA: Insufficient documentation

## 2020-03-23 LAB — SARS CORONAVIRUS 2 (TAT 6-24 HRS): SARS Coronavirus 2: NEGATIVE

## 2020-03-24 ENCOUNTER — Ambulatory Visit
Admission: RE | Admit: 2020-03-24 | Discharge: 2020-03-24 | Disposition: A | Discharge status: Home / Self Care | Payer: PPO | Attending: Vascular Surgery | Admitting: Vascular Surgery

## 2020-03-24 ENCOUNTER — Ambulatory Visit: Payer: PPO | Admitting: Anesthesiology

## 2020-03-24 ENCOUNTER — Encounter
Admission: RE | Disposition: A | Discharge status: Home / Self Care | Payer: Self-pay | Source: Home / Self Care | Attending: Vascular Surgery

## 2020-03-24 ENCOUNTER — Other Ambulatory Visit: Payer: Self-pay

## 2020-03-24 ENCOUNTER — Encounter: Payer: Self-pay | Admitting: Vascular Surgery

## 2020-03-24 DIAGNOSIS — Z7982 Long term (current) use of aspirin: Secondary | ICD-10-CM | POA: Insufficient documentation

## 2020-03-24 DIAGNOSIS — Z7984 Long term (current) use of oral hypoglycemic drugs: Secondary | ICD-10-CM | POA: Insufficient documentation

## 2020-03-24 DIAGNOSIS — I10 Essential (primary) hypertension: Secondary | ICD-10-CM | POA: Insufficient documentation

## 2020-03-24 DIAGNOSIS — E11621 Type 2 diabetes mellitus with foot ulcer: Secondary | ICD-10-CM | POA: Insufficient documentation

## 2020-03-24 DIAGNOSIS — F17290 Nicotine dependence, other tobacco product, uncomplicated: Secondary | ICD-10-CM | POA: Diagnosis not present

## 2020-03-24 DIAGNOSIS — I70245 Atherosclerosis of native arteries of left leg with ulceration of other part of foot: Secondary | ICD-10-CM | POA: Insufficient documentation

## 2020-03-24 DIAGNOSIS — I70299 Other atherosclerosis of native arteries of extremities, unspecified extremity: Secondary | ICD-10-CM

## 2020-03-24 DIAGNOSIS — L97521 Non-pressure chronic ulcer of other part of left foot limited to breakdown of skin: Secondary | ICD-10-CM | POA: Insufficient documentation

## 2020-03-24 DIAGNOSIS — I70248 Atherosclerosis of native arteries of left leg with ulceration of other part of lower left leg: Secondary | ICD-10-CM | POA: Diagnosis not present

## 2020-03-24 DIAGNOSIS — E1151 Type 2 diabetes mellitus with diabetic peripheral angiopathy without gangrene: Secondary | ICD-10-CM | POA: Diagnosis not present

## 2020-03-24 DIAGNOSIS — I70244 Atherosclerosis of native arteries of left leg with ulceration of heel and midfoot: Secondary | ICD-10-CM | POA: Diagnosis not present

## 2020-03-24 DIAGNOSIS — E785 Hyperlipidemia, unspecified: Secondary | ICD-10-CM | POA: Insufficient documentation

## 2020-03-24 DIAGNOSIS — Z7902 Long term (current) use of antithrombotics/antiplatelets: Secondary | ICD-10-CM | POA: Insufficient documentation

## 2020-03-24 HISTORY — PX: LOWER EXTREMITY ANGIOGRAPHY: CATH118251

## 2020-03-24 LAB — BUN: BUN: 17 mg/dL (ref 6–20)

## 2020-03-24 LAB — CREATININE, SERUM
Creatinine, Ser: 0.94 mg/dL (ref 0.61–1.24)
GFR, Estimated: >60 mL/min (ref >60)

## 2020-03-24 LAB — GLUCOSE, CAPILLARY
Glucose-Capillary: 126 mg/dL — ABNORMAL HIGH (ref 70–99)
Glucose-Capillary: 122 mg/dL — ABNORMAL HIGH (ref 70–99)

## 2020-03-24 SURGERY — LOWER EXTREMITY ANGIOGRAPHY
Anesthesia: General | Site: Leg Lower | Laterality: Left

## 2020-03-24 MED ORDER — LIDOCAINE HCL (CARDIAC) PF 100 MG/5ML IV SOSY
PREFILLED_SYRINGE | INTRAVENOUS | Status: DC | PRN
  Administered 2020-03-24: 100 mg via INTRAVENOUS

## 2020-03-24 MED ORDER — MORPHINE SULFATE (PF) 4 MG/ML IV SOLN: 2.0000 mg | INTRAVENOUS | Status: DC | PRN

## 2020-03-24 MED ORDER — DEXMEDETOMIDINE (PRECEDEX) IN NS 20 MCG/5ML (4 MCG/ML) IV SYRINGE
PREFILLED_SYRINGE | INTRAVENOUS | Status: AC
  Filled 2020-03-24: qty 10

## 2020-03-24 MED ORDER — IODIXANOL 320 MG/ML IV SOLN
INTRAVENOUS | Status: DC | PRN
  Administered 2020-03-24: 105 mL

## 2020-03-24 MED ORDER — MIDAZOLAM HCL 2 MG/2ML IJ SOLN
INTRAMUSCULAR | Status: DC | PRN
  Administered 2020-03-24: 2 mg via INTRAVENOUS

## 2020-03-24 MED ORDER — LABETALOL HCL 5 MG/ML IV SOLN: 10.0000 mg | INTRAVENOUS | Status: DC | PRN

## 2020-03-24 MED ORDER — FENTANYL CITRATE (PF) 100 MCG/2ML IJ SOLN: 25.0000 ug | INTRAMUSCULAR | Status: DC | PRN

## 2020-03-24 MED ORDER — MIDAZOLAM HCL 2 MG/ML PO SYRP: 8.0000 mg | ORAL_SOLUTION | Freq: Once | ORAL | Status: DC | PRN

## 2020-03-24 MED ORDER — ONDANSETRON HCL 4 MG/2ML IJ SOLN: 4.0000 mg | Freq: Four times a day (QID) | INTRAMUSCULAR | Status: DC | PRN

## 2020-03-24 MED ORDER — FENTANYL CITRATE (PF) 100 MCG/2ML IJ SOLN
INTRAMUSCULAR | Status: AC
  Filled 2020-03-24: qty 2

## 2020-03-24 MED ORDER — ONDANSETRON HCL 4 MG/2ML IJ SOLN
INTRAMUSCULAR | Status: AC
  Filled 2020-03-24: qty 2

## 2020-03-24 MED ORDER — MIDAZOLAM HCL 2 MG/2ML IJ SOLN
INTRAMUSCULAR | Status: AC
  Filled 2020-03-24: qty 2

## 2020-03-24 MED ORDER — HEPARIN SODIUM (PORCINE) 1000 UNIT/ML IJ SOLN
INTRAMUSCULAR | Status: DC | PRN
  Administered 2020-03-24: 5000 [IU] via INTRAVENOUS

## 2020-03-24 MED ORDER — SODIUM CHLORIDE 0.9% FLUSH: 3.0000 mL | INTRAVENOUS | Status: DC | PRN

## 2020-03-24 MED ORDER — PROPOFOL 10 MG/ML IV BOLUS
INTRAVENOUS | Status: DC | PRN
  Administered 2020-03-24: 40 mg via INTRAVENOUS

## 2020-03-24 MED ORDER — ONDANSETRON HCL 4 MG/2ML IJ SOLN
INTRAMUSCULAR | Status: DC | PRN
  Administered 2020-03-24: 4 mg via INTRAVENOUS

## 2020-03-24 MED ORDER — SODIUM CHLORIDE 0.9 % IV SOLN: 250.0000 mL | INTRAVENOUS | Status: DC | PRN

## 2020-03-24 MED ORDER — PROPOFOL 500 MG/50ML IV EMUL
INTRAVENOUS | Status: AC
  Filled 2020-03-24: qty 50

## 2020-03-24 MED ORDER — OXYCODONE HCL 5 MG/5ML PO SOLN
5.0000 mg | Freq: Once | ORAL | Status: DC | PRN
  Filled 2020-03-24: qty 5

## 2020-03-24 MED ORDER — EPHEDRINE SULFATE 50 MG/ML IJ SOLN
INTRAMUSCULAR | Status: DC | PRN
  Administered 2020-03-24 (×4): 5 mg via INTRAVENOUS

## 2020-03-24 MED ORDER — HYDRALAZINE HCL 20 MG/ML IJ SOLN: 5.0000 mg | INTRAMUSCULAR | Status: DC | PRN

## 2020-03-24 MED ORDER — SODIUM CHLORIDE 0.9 % IV SOLN: INTRAVENOUS | Status: DC

## 2020-03-24 MED ORDER — PROPOFOL 10 MG/ML IV BOLUS
INTRAVENOUS | Status: AC
  Filled 2020-03-24: qty 20

## 2020-03-24 MED ORDER — CEFAZOLIN SODIUM-DEXTROSE 2-4 GM/100ML-% IV SOLN
INTRAVENOUS | Status: AC
  Filled 2020-03-24: qty 100

## 2020-03-24 MED ORDER — PHENYLEPHRINE HCL (PRESSORS) 10 MG/ML IV SOLN
INTRAVENOUS | Status: DC | PRN
  Administered 2020-03-24 (×19): 100 ug via INTRAVENOUS

## 2020-03-24 MED ORDER — ACETAMINOPHEN 325 MG PO TABS: 650.0000 mg | ORAL_TABLET | ORAL | Status: DC | PRN

## 2020-03-24 MED ORDER — PROPOFOL 500 MG/50ML IV EMUL
INTRAVENOUS | Status: DC | PRN
  Administered 2020-03-24: 120 ug/kg/min via INTRAVENOUS

## 2020-03-24 MED ORDER — CEFAZOLIN SODIUM-DEXTROSE 2-4 GM/100ML-% IV SOLN
2.0000 g | Freq: Once | INTRAVENOUS | Status: AC
  Administered 2020-03-24: 2 g via INTRAVENOUS

## 2020-03-24 MED ORDER — GLYCOPYRROLATE 0.2 MG/ML IJ SOLN
INTRAMUSCULAR | Status: DC | PRN
  Administered 2020-03-24: .2 mg via INTRAVENOUS

## 2020-03-24 MED ORDER — METHYLPREDNISOLONE SODIUM SUCC 125 MG IJ SOLR: 125.0000 mg | Freq: Once | INTRAMUSCULAR | Status: DC | PRN

## 2020-03-24 MED ORDER — FENTANYL CITRATE (PF) 100 MCG/2ML IJ SOLN
INTRAMUSCULAR | Status: DC | PRN
  Administered 2020-03-24 (×4): 25 ug via INTRAVENOUS

## 2020-03-24 MED ORDER — OXYCODONE HCL 5 MG PO TABS
5.0000 mg | ORAL_TABLET | Freq: Once | ORAL | Status: DC | PRN
Start: 2020-03-24 — End: 2020-03-25

## 2020-03-24 MED ORDER — HYDROMORPHONE HCL 1 MG/ML IJ SOLN: 1.0000 mg | Freq: Once | INTRAMUSCULAR | Status: DC | PRN

## 2020-03-24 MED ORDER — SODIUM CHLORIDE 0.9% FLUSH: 3.0000 mL | Freq: Two times a day (BID) | INTRAVENOUS | Status: DC

## 2020-03-24 MED ORDER — DIPHENHYDRAMINE HCL 50 MG/ML IJ SOLN: 50.0000 mg | Freq: Once | INTRAMUSCULAR | Status: DC | PRN

## 2020-03-24 MED ORDER — FAMOTIDINE 20 MG PO TABS: 40.0000 mg | ORAL_TABLET | Freq: Once | ORAL | Status: DC | PRN

## 2020-03-24 MED ORDER — OXYCODONE HCL 5 MG PO TABS
5.0000 mg | ORAL_TABLET | ORAL | Status: DC | PRN
Start: 2020-03-24 — End: 2020-03-25

## 2020-03-24 SURGICAL SUPPLY — 34 items
BALLN LUTONIX  018 4X60X130 (BALLOONS) ×2
BALLN LUTONIX 018 4X60X130 (BALLOONS) ×1
BALLN LUTONIX DCB 6X40X130 (BALLOONS) ×2
BALLN ULTRASCOR 014 3.5X40X150 (BALLOONS) ×2
BALLN ULTRASCORE 014 3X200X150 (BALLOONS) ×2
BALLN ULTRVRSE 2X150X150 (BALLOONS) ×2
BALLOON LUTONIX 018 4X60X130 (BALLOONS) IMPLANT
BALLOON LUTONIX DCB 6X40X130 (BALLOONS) IMPLANT
BALLOON ULTRSCR 014 3.5X40X150 (BALLOONS) IMPLANT
BALLOON ULTRSCRE 014 3X200X150 (BALLOONS) IMPLANT
BALLOON ULTRVRSE 2X150X150 (BALLOONS) IMPLANT
CANNULA 5F STIFF (CANNULA) ×1 IMPLANT
CATH ANGIO 5F PIGTAIL 65CM (CATHETERS) ×1 IMPLANT
CATH BEACON 5 .038 100 VERT TP (CATHETERS) ×1 IMPLANT
CATH CROSSER S6 154CM (CATHETERS) ×1 IMPLANT
CATH NAVICROSS ANGLED 135CM (MICROCATHETER) ×1 IMPLANT
CATH SEEKER 014X150 (CATHETERS) ×1 IMPLANT
CATH USHER TPER 130CM (CATHETERS) ×1 IMPLANT
COVER EZ STRL 42X30 (DRAPES) ×1 IMPLANT
COVER PROBE U/S 5X48 (MISCELLANEOUS) ×1 IMPLANT
DEVICE STARCLOSE SE CLOSURE (Vascular Products) ×1 IMPLANT
GLIDEWIRE ADV .014X300CM (WIRE) ×1 IMPLANT
GLIDEWIRE ADV .035X260CM (WIRE) ×1 IMPLANT
KIT ENCORE 26 ADVANTAGE (KITS) ×1 IMPLANT
KIT FLOWMATE PROCEDURAL (KITS) ×1 IMPLANT
PACK ANGIOGRAPHY (CUSTOM PROCEDURE TRAY) ×2 IMPLANT
SHEATH BRITE TIP 5FRX11 (SHEATH) ×1 IMPLANT
SHEATH RAABE 7FR (SHEATH) ×1 IMPLANT
SYR MEDRAD MARK 7 150ML (SYRINGE) ×1 IMPLANT
TOWEL OR 17X26 4PK STRL BLUE (TOWEL DISPOSABLE) ×1 IMPLANT
TUBING CONTRAST HIGH PRESS 72 (TUBING) ×1 IMPLANT
WIRE G V18X300CM (WIRE) ×1 IMPLANT
WIRE GUIDERIGHT .035X150 (WIRE) ×1 IMPLANT
WIRE RUNTHROUGH .014X300CM (WIRE) ×1 IMPLANT

## 2020-03-24 NOTE — Interval H&P Note (Signed)
History and Physical Interval Note:  03/24/2020 11:18 AM  Reginald Nielsen  has presented today for surgery, with the diagnosis of LT lower extremity angio  BARD   ANESTHESIA   ASO w ulceration Covid  March 11.  The various methods of treatment have been discussed with the patient and family. After consideration of risks, benefits and other options for treatment, the patient has consented to  Procedure(s): LOWER EXTREMITY ANGIOGRAPHY (Left) as a surgical intervention.  The patient's history has been reviewed, patient examined, no change in status, stable for surgery.  I have reviewed the patient's chart and labs.  Questions were answered to the patient's satisfaction.     Levora Dredge

## 2020-03-24 NOTE — Transfer of Care (Signed)
Immediate Anesthesia Transfer of Care Note  Patient: Reginald Nielsen  Procedure(s) Performed: LOWER EXTREMITY ANGIOGRAPHY (Left Leg Lower)  Patient Location: PACU  Anesthesia Type:General  Level of Consciousness: drowsy  Airway & Oxygen Therapy: Patient Spontanous Breathing  Post-op Assessment: Report given to RN and Post -op Vital signs reviewed and stable  Post vital signs: Reviewed and stable  Last Vitals:  Vitals Value Taken Time  BP 101/64 03/24/20 1533  Temp    Pulse 68 03/24/20 1537  Resp 8 03/24/20 1537  SpO2 100 % 03/24/20 1537  Vitals shown include unvalidated device data.  Last Pain:  Vitals:   03/24/20 1222  TempSrc: Oral  PainSc: 0-No pain         Complications: No complications documented.

## 2020-03-24 NOTE — Interval H&P Note (Signed)
History and Physical Interval Note:  03/24/2020 11:19 AM  Reginald Nielsen  has presented today for surgery, with the diagnosis of LT lower extremity angio  BARD   ANESTHESIA   ASO w ulceration Covid  March 11.  The various methods of treatment have been discussed with the patient and family. After consideration of risks, benefits and other options for treatment, the patient has consented to  Procedure(s): LOWER EXTREMITY ANGIOGRAPHY (Left) as a surgical intervention.  The patient's history has been reviewed, patient examined, no change in status, stable for surgery.  I have reviewed the patient's chart and labs.  Questions were answered to the patient's satisfaction.     Levora Dredge

## 2020-03-24 NOTE — Op Note (Signed)
[facility] Percutaneous Study/Intervention Procedural Note   Date of Surgery: 03/24/2020  Surgeon:  Katha Cabal, MD.  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 Introduction catheter into left lower extremity 3rd order catheter placement with additional third order catheter placement 2. Contrast injection left lower extremity for distal runoff   3. Crosser atherectomy of the anterior tibial artery 4. Percutaneous transluminal angioplasty anterior tibial artery up to 4 mm proximally             5.  Percutaneous transluminal angioplasty of the SFA with a 6 mm x 40 mm Lutonix balloon                      6.   Attempted crosser atherectomy of the peroneal artery unsuccessful.             7.  Star close closure right common femoral arteriotomy                Anesthesia: General anesthesia  Sheath: 7 French Raby left common femoral retrograde  Contrast: 105 cc  Fluoroscopy Time: 15.1 minutes  Indications: Reginald Nielsen presents with atherosclerotic occlusive disease bilateral lower extremities. The patient has developed ulceration of the soft tissues of the left lower extremity. This places the patient at high risk for limb loss and amputation. The risks and benefits are reviewed all questions answered patient agrees to proceed.  Procedure: Reginald Nielsen is a 60 y.o. y.o. male who was identified and appropriate procedural time out was performed. The patient was then placed supine on the table and prepped and draped in the usual sterile fashion.   Ultrasound was placed in the sterile sleeve and the right groin was evaluated the right common femoral artery was echolucent and pulsatile indicating patency.  Image was recorded for the  permanent record and under real-time visualization a microneedle was inserted into the common femoral artery microwire followed by a micro-sheath.  A J-wire was then advanced through the micro-sheath and a  5 [place] sheath was then inserted over a J-wire. J-wire was then advanced and a 5 French pigtail catheter was positioned at the level of T12. AP projection of the aorta was then obtained. Pigtail catheter was repositioned to above the bifurcation and a LAO view of the pelvis was obtained.  Subsequently a pigtail catheter with the advantage Glidewire was used to cross the aortic bifurcation the catheter wire were advanced down into the left distal external iliac artery. Oblique view of the femoral bifurcation was then obtained and subsequently the wire was reintroduced and the pigtail catheter negotiated into the SFA representing third order catheter placement. Distal runoff was then performed.  Diagnostic interpretation: The abdominal aorta is opacified with a bolus injection contrast.  Is widely patent and free of hemodynamically significant stenoses.  The bilateral common and external iliac arteries are widely patent and free of hemodynamically significant stenosis.  The left common femoral profunda femoris and proximal two thirds of the superficial femoral artery demonstrate mild disease but there are no hemodynamically significant stenoses.  At [facility] canal there is a focal 70% stenosis, length is 20 mm, and distal to this lesion the popliteal artery is mildly diseased but widely patent.  The trifurcation demonstrates increasing disease with greater than 80% stenosis at the origin of the anterior tibial and subsequently a occlusion  of the anterior tibial in its midportion and then diffuse greater than 60% disease in its distal one third.  Distally the anterior tibial fills quite nicely at the level of the ankle and appears to be 3 mm in diameter it fills the dorsalis pedis and the entire pedal arch.   The tibioperoneal trunk is patent.  The posterior tibial is occluded at its origin and remains essentially occluded throughout its course there is very poor filling of the plantar vessels below the ankle level and there is not significant contribution from this distribution to the pedal arch.  The peroneal is 2-1/2 to 3 mm in its proximal one third then it occludes the distal one third reconstitutes and appears to have several large collaterals crossing the ankle.  5000 units of heparin was then given and allowed to circulate and a 7 [place] Raby sheath was advanced up and over the bifurcation and positioned in the common femoral artery.  The advantage wire was then used to negotiate the SFA stenosis and a 6 mm x 40 mm Lutonix drug-eluting balloon was advanced across the SFA stenosis inflated to 10 atm for 2 full minutes.  Follow-up imaging demonstrated less than 15% residual stenosis.  Kumpe catheter was then introduced and the advantage wire and Kumpe catheter negotiated to the peroneal where hand-injection contrast in the peroneal just above the occlusion was performed.  The wire was then exchanged for an 018 advantage and an Usher catheter advanced over the wire.  The S6 Crosser catheter was then prepped on the field and as noted above the Usher catheter was advanced into the cul-de-sac of the peroneal under magnified imaging. Using the Crosser catheter the occlusion of the peroneal was not successfully crossed and attention was returned to the anterior tibial.  The catheter was then pulled back into the distal popliteal where magnified imaging of the trifurcation was performed and the wire and catheter were negotiated into the anterior tibial where magnified imaging of the occlusion was performed.  Wire was reintroduced and the Usher catheter was positioned in the anterior tibial.  The Crosser S6 atherectomy catheter was then advanced through the Usher and the occlusion of the mid anterior tibial was  crossed.  Usher catheter was advanced and hand-injection of contrast confirmed intraluminal positioning.  The 0.014 run-through wire was then advanced so that the tip was in the distal dorsalis pedis.  Beginning with a 2 mm x 100 mm balloon the occluded segment of the anterior tibial was angioplastied to 10 atm for 1 minute.  Next a 3 mm x 200 mm ultra score balloon was advanced and beginning at the origin of the anterior tibial 2 serial inflations were performed to 10 atm each for 1 minute.  Follow-up imaging demonstrated persistent greater than 40% stenosis at the origin and proximal anterior tibial and a 4 mm x 60 mm Lutonix drug-eluting balloon was advanced across this lesion and angioplasty to 8 atm for 2 full minutes was performed.  Follow-up imaging demonstrated a persistent greater than 60% stenosis in the midportion where the occlusion had been and I elected to treat this with a 3.5 mm x 40 mm ultra score balloon for serial inflations were performed.  Distal runoff was then completed by hand injection through the sheath demonstrating wide patency of the anterior tibial with less than 10% residual stenosis and excellent filling of the pedal arch.  After review of these images the sheath is pulled into the right external iliac oblique of the  common femoral is obtained and a Star close device deployed. There no immediate Complications.  Findings:  The abdominal aorta is opacified with a bolus injection contrast.  Is widely patent and free of hemodynamically significant stenoses.  The bilateral common and external iliac arteries are widely patent and free of hemodynamically significant stenosis.  The left common femoral profunda femoris and proximal two thirds of the superficial femoral artery demonstrate mild disease but there are no hemodynamically significant stenoses.  At [facility] canal there is a focal 70% stenosis, length is 20 mm, and distal to this lesion the popliteal artery is mildly diseased  but widely patent.  The trifurcation demonstrates increasing disease with greater than 80% stenosis at the origin of the anterior tibial and subsequently a occlusion of the anterior tibial in its midportion and then diffuse greater than 60% disease in its distal one third.  Distally the anterior tibial fills quite nicely at the level of the ankle and appears to be 3 mm in diameter it fills the dorsalis pedis and the entire pedal arch.  The tibioperoneal trunk is patent.  The posterior tibial is occluded at its origin and remains essentially occluded throughout its course there is very poor filling of the plantar vessels below the ankle level and there is not significant contribution from this distribution to the pedal arch.  The peroneal is 2-1/2 to 3 mm in its proximal one third then it occludes the distal one third reconstitutes and appears to have several large collaterals crossing the ankle.  Following angioplasty of the superficial femoral artery there is now wide patency with less than 15% residual stenosis.  The peroneal remains unchanged.  The anterior tibial now is widely patent in-line flow and looks quite nice with less than 5% residual stenosis status post successful crosser atherectomy and subsequent angioplasty.   Disposition: Patient was taken to the recovery room in stable condition having tolerated the procedure well.  Reginald Nielsen 03/24/2020,3:48 PM

## 2020-03-24 NOTE — Anesthesia Postprocedure Evaluation (Signed)
Anesthesia Post Note  Patient: Reginald Nielsen  Procedure(s) Performed: LOWER EXTREMITY ANGIOGRAPHY (Left Leg Lower)  Patient location during evaluation: PACU Anesthesia Type: General Level of consciousness: awake and alert Pain management: pain level controlled Vital Signs Assessment: post-procedure vital signs reviewed and stable Respiratory status: spontaneous breathing and respiratory function stable Cardiovascular status: stable Anesthetic complications: no   No complications documented.   Last Vitals:  Vitals:   03/24/20 1222 03/24/20 1534  BP: 133/74 101/64  Pulse: (!) 53 66  Resp: 18 14  Temp: 36.6 C 36.7 C  SpO2: 100%     Last Pain:  Vitals:   03/24/20 1534  TempSrc:   PainSc: Asleep                 KEPHART,WILLIAM K

## 2020-03-24 NOTE — Anesthesia Procedure Notes (Signed)
Procedure Name: MAC Date/Time: 03/24/2020 1:40 PM Performed by: Lily Peer, Tatia Petrucci, CRNA Pre-anesthesia Checklist: Patient identified, Emergency Drugs available, Suction available, Patient being monitored and Timeout performed Patient Re-evaluated:Patient Re-evaluated prior to induction Oxygen Delivery Method: Simple face mask Induction Type: IV induction

## 2020-03-24 NOTE — Anesthesia Preprocedure Evaluation (Signed)
Anesthesia Evaluation  Patient identified by MRN, date of birth, ID band Patient awake    Reviewed: Allergy & Precautions, H&P , NPO status , Patient's Chart, lab work & pertinent test results  History of Anesthesia Complications (+) history of anesthetic complications  Airway Mallampati: III  TM Distance: >3 FB Neck ROM: full    Dental  (+) Chipped, Missing, Poor Dentition   Pulmonary neg pulmonary ROS, neg shortness of breath,    Pulmonary exam normal        Cardiovascular Exercise Tolerance: Good hypertension, (-) angina+ Peripheral Vascular Disease  (-) Past MI Normal cardiovascular exam     Neuro/Psych  Neuromuscular disease negative psych ROS   GI/Hepatic negative GI ROS, Neg liver ROS,   Endo/Other  diabetes, Type 2  Renal/GU negative Renal ROS  negative genitourinary   Musculoskeletal  (+) Arthritis ,   Abdominal   Peds  Hematology negative hematology ROS (+)   Anesthesia Other Findings Past Medical History: No date: Complication of anesthesia     Comment:  Patient stated woke up during last procdure for               angiograph No date: Diabetes mellitus without complication (HCC)     Comment:  type 2 No date: Glaucoma No date: Hyperlipidemia No date: Hypertension No date: Shoulder pain, left  Past Surgical History: 04/08/2019: COLONOSCOPY WITH PROPOFOL; N/A     Comment:  Procedure: COLONOSCOPY WITH PROPOFOL;  Surgeon: Midge Minium, MD;  Location: [facility];  Service:               Endoscopy;  Laterality: N/A; 2011: EYE SURGERY; Bilateral     Comment:  laser surgery and cataracts  No date: frozen shoulder 1993: HERNIA REPAIR     Comment:  umbilical hernia  11/02/2019: IRRIGATION AND DEBRIDEMENT FOOT; Left     Comment:  Procedure: IRRIGATION AND DEBRIDEMENT FOOT;  Surgeon:               Linus Galas, DPM;  Location: [facility];  Service:               Podiatry;   Laterality: Left; 11/06/2019: IRRIGATION AND DEBRIDEMENT FOOT; Left     Comment:  Procedure: IRRIGATION AND DEBRIDEMENT FOOT;  Surgeon:               Linus Galas, DPM;  Location: [facility];  Service:               Podiatry;  Laterality: Left; 09/03/2019: LOWER EXTREMITY ANGIOGRAPHY; Left     Comment:  Procedure: LOWER EXTREMITY ANGIOGRAPHY;  Surgeon:               Renford Dills, MD;  Location: [facility];               Service: Cardiovascular;  Laterality: Left; 11/05/2019: LOWER EXTREMITY ANGIOGRAPHY; Left     Comment:  Procedure: Lower Extremity Angiography;  Surgeon:               Renford Dills, MD;  Location: [facility];               Service: Cardiovascular;  Laterality: Left; No date: SHOULDER SURGERY     Comment:  2015 and 2016  BMI    Body Mass Index: 26.09 kg/m      Reproductive/Obstetrics negative OB ROS  Anesthesia Physical Anesthesia Plan  ASA: III  Anesthesia Plan: General   Post-op Pain Management:    Induction: Intravenous  PONV Risk Score and Plan: Propofol infusion and TIVA  Airway Management Planned: Natural Airway and Nasal Cannula  Additional Equipment:   Intra-op Plan:   Post-operative Plan:   Informed Consent: I have reviewed the patients History and Physical, chart, labs and discussed the procedure including the risks, benefits and alternatives for the proposed anesthesia with the patient or authorized representative who has indicated his/her understanding and acceptance.     Dental Advisory Given  Plan Discussed with: Anesthesiologist, CRNA and Surgeon  Anesthesia Plan Comments: (Patient consented for risks of anesthesia including but not limited to:  - adverse reactions to medications - risk of airway placement if required - damage to eyes, teeth, lips or other oral mucosa - nerve damage due to positioning  - sore throat or hoarseness - Damage to heart,  brain, nerves, lungs, other parts of body or loss of life  Patient voiced understanding.)        Anesthesia Quick Evaluation

## 2020-03-25 ENCOUNTER — Encounter: Payer: Self-pay | Admitting: Vascular Surgery

## 2020-03-25 ENCOUNTER — Telehealth: Payer: Self-pay

## 2020-03-25 DIAGNOSIS — S0501XA Injury of conjunctiva and corneal abrasion without foreign body, right eye, initial encounter: Secondary | ICD-10-CM | POA: Diagnosis not present

## 2020-03-25 NOTE — Telephone Encounter (Unsigned)
Copied from CRM [id number]. Topic: General - Other >> Mar 25, 2020  3:59 PM Randol Kern wrote: Reason for CRM: Pt wants to leave a message for Delice Bison,   "Let her know that im low on gabapentin 100 MG, I have two left. Has another surgery Friday"  [id number], [place] - [address] [place] [address] Phone: [phone] Fax: [phone]  Must go to this pharmacy listed above not the [facility] please advise   [phone]

## 2020-03-26 ENCOUNTER — Other Ambulatory Visit: Payer: Self-pay

## 2020-03-26 DIAGNOSIS — S0501XA Injury of conjunctiva and corneal abrasion without foreign body, right eye, initial encounter: Secondary | ICD-10-CM | POA: Diagnosis not present

## 2020-03-26 DIAGNOSIS — G629 Polyneuropathy, unspecified: Secondary | ICD-10-CM

## 2020-03-26 MED ORDER — GABAPENTIN 100 MG PO CAPS: 100.0000 mg | ORAL_CAPSULE | Freq: Two times a day (BID) | ORAL | 0 refills | Status: DC

## 2020-03-26 NOTE — Telephone Encounter (Signed)
Called pt and let him know I sent it to [facility]

## 2020-03-26 NOTE — Progress Notes (Unsigned)
Sent in gab to [facility]

## 2020-03-27 ENCOUNTER — Encounter: Payer: Self-pay | Admitting: Podiatry

## 2020-03-27 ENCOUNTER — Encounter
Admission: RE | Disposition: A | Discharge status: Home / Self Care | Payer: Self-pay | Source: Home / Self Care | Attending: Podiatry

## 2020-03-27 ENCOUNTER — Other Ambulatory Visit: Payer: Self-pay

## 2020-03-27 ENCOUNTER — Ambulatory Visit: Payer: PPO | Admitting: Anesthesiology

## 2020-03-27 ENCOUNTER — Telehealth: Payer: Self-pay

## 2020-03-27 ENCOUNTER — Ambulatory Visit
Admission: RE | Admit: 2020-03-27 | Discharge: 2020-03-27 | Disposition: A | Discharge status: Home / Self Care | Payer: PPO | Attending: Podiatry | Admitting: Podiatry

## 2020-03-27 DIAGNOSIS — Z792 Long term (current) use of antibiotics: Secondary | ICD-10-CM | POA: Insufficient documentation

## 2020-03-27 DIAGNOSIS — E11621 Type 2 diabetes mellitus with foot ulcer: Secondary | ICD-10-CM | POA: Insufficient documentation

## 2020-03-27 DIAGNOSIS — Z7982 Long term (current) use of aspirin: Secondary | ICD-10-CM | POA: Insufficient documentation

## 2020-03-27 DIAGNOSIS — L97524 Non-pressure chronic ulcer of other part of left foot with necrosis of bone: Secondary | ICD-10-CM | POA: Insufficient documentation

## 2020-03-27 DIAGNOSIS — E1142 Type 2 diabetes mellitus with diabetic polyneuropathy: Secondary | ICD-10-CM | POA: Diagnosis not present

## 2020-03-27 DIAGNOSIS — Z7984 Long term (current) use of oral hypoglycemic drugs: Secondary | ICD-10-CM | POA: Insufficient documentation

## 2020-03-27 DIAGNOSIS — L97523 Non-pressure chronic ulcer of other part of left foot with necrosis of muscle: Secondary | ICD-10-CM | POA: Diagnosis not present

## 2020-03-27 DIAGNOSIS — E785 Hyperlipidemia, unspecified: Secondary | ICD-10-CM | POA: Diagnosis not present

## 2020-03-27 DIAGNOSIS — Z79899 Other long term (current) drug therapy: Secondary | ICD-10-CM | POA: Insufficient documentation

## 2020-03-27 DIAGNOSIS — L97529 Non-pressure chronic ulcer of other part of left foot with unspecified severity: Secondary | ICD-10-CM | POA: Diagnosis not present

## 2020-03-27 DIAGNOSIS — I10 Essential (primary) hypertension: Secondary | ICD-10-CM | POA: Insufficient documentation

## 2020-03-27 DIAGNOSIS — Z7902 Long term (current) use of antithrombotics/antiplatelets: Secondary | ICD-10-CM | POA: Insufficient documentation

## 2020-03-27 DIAGNOSIS — E1140 Type 2 diabetes mellitus with diabetic neuropathy, unspecified: Secondary | ICD-10-CM | POA: Diagnosis not present

## 2020-03-27 HISTORY — PX: AMPUTATION: SHX166

## 2020-03-27 LAB — GLUCOSE, CAPILLARY
Glucose-Capillary: 135 mg/dL — ABNORMAL HIGH (ref 70–99)
Glucose-Capillary: 144 mg/dL — ABNORMAL HIGH (ref 70–99)

## 2020-03-27 SURGERY — AMPUTATION, FOOT, RAY
Anesthesia: General | Laterality: Left

## 2020-03-27 MED ORDER — POVIDONE-IODINE 7.5 % EX SOLN
Freq: Once | CUTANEOUS | Status: DC
  Filled 2020-03-27: qty 118

## 2020-03-27 MED ORDER — PROPOFOL 10 MG/ML IV BOLUS
INTRAVENOUS | Status: AC
  Filled 2020-03-27: qty 20

## 2020-03-27 MED ORDER — OXYCODONE-ACETAMINOPHEN 5-325 MG PO TABS: 1.0000 | ORAL_TABLET | Freq: Four times a day (QID) | ORAL | 0 refills | Status: DC | PRN

## 2020-03-27 MED ORDER — LIDOCAINE HCL (PF) 1 % IJ SOLN
INTRAMUSCULAR | Status: AC
  Filled 2020-03-27: qty 30

## 2020-03-27 MED ORDER — LIDOCAINE HCL (PF) 1 % IJ SOLN
INTRAMUSCULAR | Status: DC | PRN
  Administered 2020-03-27: 10 mL

## 2020-03-27 MED ORDER — ONDANSETRON HCL 4 MG/2ML IJ SOLN: 4.0000 mg | Freq: Four times a day (QID) | INTRAMUSCULAR | Status: DC | PRN

## 2020-03-27 MED ORDER — CHLORHEXIDINE GLUCONATE 0.12 % MT SOLN
OROMUCOSAL | Status: AC
  Administered 2020-03-27: 15 mL via OROMUCOSAL
  Filled 2020-03-27: qty 15

## 2020-03-27 MED ORDER — OXYCODONE HCL 5 MG PO TABS: 5.0000 mg | ORAL_TABLET | Freq: Once | ORAL | Status: DC | PRN

## 2020-03-27 MED ORDER — MIDAZOLAM HCL 2 MG/2ML IJ SOLN
INTRAMUSCULAR | Status: DC | PRN
  Administered 2020-03-27 (×2): 1 mg via INTRAVENOUS

## 2020-03-27 MED ORDER — DEXAMETHASONE SODIUM PHOSPHATE 10 MG/ML IJ SOLN
INTRAMUSCULAR | Status: DC | PRN
  Administered 2020-03-27: 5 mg via INTRAVENOUS

## 2020-03-27 MED ORDER — EPHEDRINE SULFATE 50 MG/ML IJ SOLN
INTRAMUSCULAR | Status: DC | PRN
  Administered 2020-03-27 (×3): 5 mg via INTRAVENOUS

## 2020-03-27 MED ORDER — CEFAZOLIN SODIUM-DEXTROSE 2-4 GM/100ML-% IV SOLN
2.0000 g | INTRAVENOUS | Status: AC
  Administered 2020-03-27: 2 g via INTRAVENOUS

## 2020-03-27 MED ORDER — CHLORHEXIDINE GLUCONATE 0.12 % MT SOLN: 15.0000 mL | Freq: Once | OROMUCOSAL | Status: AC

## 2020-03-27 MED ORDER — PROPOFOL 500 MG/50ML IV EMUL
INTRAVENOUS | Status: AC
  Filled 2020-03-27: qty 50

## 2020-03-27 MED ORDER — FAMOTIDINE 20 MG PO TABS
ORAL_TABLET | ORAL | Status: AC
  Administered 2020-03-27: 20 mg via ORAL
  Filled 2020-03-27: qty 1

## 2020-03-27 MED ORDER — ONDANSETRON HCL 4 MG PO TABS: 4.0000 mg | ORAL_TABLET | Freq: Four times a day (QID) | ORAL | Status: DC | PRN

## 2020-03-27 MED ORDER — SODIUM CHLORIDE 0.9 % IV SOLN: INTRAVENOUS | Status: DC

## 2020-03-27 MED ORDER — ORAL CARE MOUTH RINSE: 15.0000 mL | Freq: Once | OROMUCOSAL | Status: AC

## 2020-03-27 MED ORDER — BUPIVACAINE HCL 0.5 % IJ SOLN
INTRAMUSCULAR | Status: DC | PRN
  Administered 2020-03-27: 10 mL

## 2020-03-27 MED ORDER — LACTATED RINGERS IV SOLN: INTRAVENOUS | Status: DC | PRN

## 2020-03-27 MED ORDER — LIDOCAINE-EPINEPHRINE 1 %-1:100000 IJ SOLN
INTRAMUSCULAR | Status: DC | PRN
  Administered 2020-03-27: 10 mL

## 2020-03-27 MED ORDER — FAMOTIDINE 20 MG PO TABS: 20.0000 mg | ORAL_TABLET | Freq: Once | ORAL | Status: AC

## 2020-03-27 MED ORDER — MIDAZOLAM HCL 2 MG/2ML IJ SOLN
INTRAMUSCULAR | Status: AC
  Filled 2020-03-27: qty 2

## 2020-03-27 MED ORDER — LIDOCAINE HCL (CARDIAC) PF 100 MG/5ML IV SOSY
PREFILLED_SYRINGE | INTRAVENOUS | Status: DC | PRN
  Administered 2020-03-27: 100 mg via INTRAVENOUS

## 2020-03-27 MED ORDER — METOCLOPRAMIDE HCL 10 MG PO TABS
5.0000 mg | ORAL_TABLET | Freq: Three times a day (TID) | ORAL | Status: DC | PRN
Start: 2020-03-27 — End: 2020-03-27

## 2020-03-27 MED ORDER — CEFDINIR 300 MG PO CAPS: 300.0000 mg | ORAL_CAPSULE | Freq: Two times a day (BID) | ORAL | 1 refills | Status: AC

## 2020-03-27 MED ORDER — CEFAZOLIN SODIUM-DEXTROSE 2-4 GM/100ML-% IV SOLN
INTRAVENOUS | Status: AC
  Filled 2020-03-27: qty 100

## 2020-03-27 MED ORDER — PROPOFOL 10 MG/ML IV BOLUS
INTRAVENOUS | Status: DC | PRN
  Administered 2020-03-27: 200 mg via INTRAVENOUS

## 2020-03-27 MED ORDER — FENTANYL CITRATE (PF) 100 MCG/2ML IJ SOLN: 25.0000 ug | INTRAMUSCULAR | Status: DC | PRN

## 2020-03-27 MED ORDER — OXYCODONE HCL 5 MG/5ML PO SOLN: 5.0000 mg | Freq: Once | ORAL | Status: DC | PRN

## 2020-03-27 MED ORDER — LIDOCAINE-EPINEPHRINE 1 %-1:100000 IJ SOLN
INTRAMUSCULAR | Status: AC
  Filled 2020-03-27: qty 1

## 2020-03-27 MED ORDER — ONDANSETRON HCL 4 MG/2ML IJ SOLN
INTRAMUSCULAR | Status: DC | PRN
  Administered 2020-03-27: 4 mg via INTRAVENOUS

## 2020-03-27 MED ORDER — BUPIVACAINE HCL (PF) 0.5 % IJ SOLN
INTRAMUSCULAR | Status: AC
  Filled 2020-03-27: qty 30

## 2020-03-27 MED ORDER — FENTANYL CITRATE (PF) 100 MCG/2ML IJ SOLN
INTRAMUSCULAR | Status: AC
  Filled 2020-03-27: qty 2

## 2020-03-27 MED ORDER — FENTANYL CITRATE (PF) 100 MCG/2ML IJ SOLN
INTRAMUSCULAR | Status: DC | PRN
  Administered 2020-03-27: 25 ug via INTRAVENOUS

## 2020-03-27 MED ORDER — METOCLOPRAMIDE HCL 5 MG/ML IJ SOLN
5.0000 mg | Freq: Three times a day (TID) | INTRAMUSCULAR | Status: DC | PRN
Start: 2020-03-27 — End: 2020-03-27

## 2020-03-27 SURGICAL SUPPLY — 52 items
BLADE MED AGGRESSIVE (BLADE) ×1 IMPLANT
BLADE OSC/SAGITTAL MD 5.5X18 (BLADE) ×2 IMPLANT
BLADE SURG MINI STRL (BLADE) ×2 IMPLANT
BNDG CMPR STD VLCR NS LF 5.8X4 (GAUZE/BANDAGES/DRESSINGS) ×1
BNDG CONFORM 2 STRL LF (GAUZE/BANDAGES/DRESSINGS) ×2 IMPLANT
BNDG CONFORM 3 STRL LF (GAUZE/BANDAGES/DRESSINGS) ×4 IMPLANT
BNDG ELASTIC 4X5.8 VLCR NS LF (GAUZE/BANDAGES/DRESSINGS) ×2 IMPLANT
BNDG ESMARK 4X12 TAN STRL LF (GAUZE/BANDAGES/DRESSINGS) ×1 IMPLANT
BNDG GAUZE 4.5X4.1 6PLY STRL (MISCELLANEOUS) ×2 IMPLANT
CANISTER SUCT 1200ML W/VALVE (MISCELLANEOUS) ×2 IMPLANT
COVER WAND RF STERILE (DRAPES) ×2 IMPLANT
CUFF TOURN SGL QUICK 12 (TOURNIQUET CUFF) IMPLANT
CUFF TOURN SGL QUICK 18X4 (TOURNIQUET CUFF) ×1 IMPLANT
DRAPE FLUOR MINI C-ARM 54X84 (DRAPES) ×1 IMPLANT
DRAPE XRAY CASSETTE 23X24 (DRAPES) ×1 IMPLANT
DURAPREP 26ML APPLICATOR (WOUND CARE) ×2 IMPLANT
ELECT REM PT RETURN 9FT ADLT (ELECTROSURGICAL) ×2
ELECTRODE REM PT RTRN 9FT ADLT (ELECTROSURGICAL) ×1 IMPLANT
GAUZE PACKING IODOFORM 1/2 (PACKING) ×2 IMPLANT
GAUZE SPONGE 4X4 12PLY STRL (GAUZE/BANDAGES/DRESSINGS) ×2 IMPLANT
GAUZE XEROFORM 1X8 LF (GAUZE/BANDAGES/DRESSINGS) ×2 IMPLANT
GLOVE SURG ENC MOIS LTX SZ7.5 (GLOVE) ×2 IMPLANT
GLOVE SURG UNDER LTX SZ8 (GLOVE) ×2 IMPLANT
GOWN STRL REUS W/ TWL XL LVL3 (GOWN DISPOSABLE) ×2 IMPLANT
GOWN STRL REUS W/TWL XL LVL3 (GOWN DISPOSABLE) ×4
KIT TURNOVER KIT A (KITS) ×2 IMPLANT
LABEL OR SOLS (LABEL) ×2 IMPLANT
MANIFOLD NEPTUNE II (INSTRUMENTS) ×2 IMPLANT
NDL FILTER BLUNT 18X1 1/2 (NEEDLE) ×1 IMPLANT
NDL HYPO 25X1 1.5 SAFETY (NEEDLE) ×1 IMPLANT
NEEDLE FILTER BLUNT 18X 1/2SAF (NEEDLE) ×1
NEEDLE FILTER BLUNT 18X1 1/2 (NEEDLE) ×1 IMPLANT
NEEDLE HYPO 25X1 1.5 SAFETY (NEEDLE) ×2 IMPLANT
NS IRRIG 500ML POUR BTL (IV SOLUTION) ×2 IMPLANT
PACK EXTREMITY ARMC (MISCELLANEOUS) ×2 IMPLANT
PAD ABD DERMACEA PRESS 5X9 (GAUZE/BANDAGES/DRESSINGS) ×4 IMPLANT
PULSAVAC PLUS IRRIG FAN TIP (DISPOSABLE) ×2
SHIELD FULL FACE ANTIFOG 7M (MISCELLANEOUS) ×2 IMPLANT
SOL .9 NS 3000ML IRR  AL (IV SOLUTION) ×2
SOL .9 NS 3000ML IRR AL (IV SOLUTION) ×1
SOL .9 NS 3000ML IRR UROMATIC (IV SOLUTION) ×1 IMPLANT
STOCKINETTE M/LG 89821 (MISCELLANEOUS) ×2 IMPLANT
STRAP SAFETY 5IN WIDE (MISCELLANEOUS) ×2 IMPLANT
SUT ETHILON 2 0 FS 18 (SUTURE) ×4 IMPLANT
SUT ETHILON 3-0 FS-10 30 BLK (SUTURE)
SUT ETHILON 5-0 FS-2 18 BLK (SUTURE) ×1 IMPLANT
SUT VIC AB 2-0 SH 27 (SUTURE) ×2
SUT VIC AB 2-0 SH 27XBRD (SUTURE) IMPLANT
SUT VIC AB 4-0 FS2 27 (SUTURE) ×1 IMPLANT
SUTURE EHLN 3-0 FS-10 30 BLK (SUTURE) ×1 IMPLANT
SYR 10ML LL (SYRINGE) ×6 IMPLANT
TIP FAN IRRIG PULSAVAC PLUS (DISPOSABLE) ×1 IMPLANT

## 2020-03-27 NOTE — Telephone Encounter (Signed)
Spoke to pt- told him that we sent it into pharmacy yesterday. He was going off a "text" he received from the pharmacy, but can't tell me when he received it. So I told [facility] to call [facility] and ask if they [facility] RX from yesterday. If they do and they are still saying the 23rd, there is not anything we can do about that. It is an Community education officer requirement. Pt verbalized understanding

## 2020-03-27 NOTE — H&P (Signed)
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  The various methods of treatment have been discussed with the patient.  No guarantees were given.  After consideration of risks, benefits and other options for treatment, the patient has consented to surgery.  I have reviewed the patients' chart and labs.     A history and physical examination was performed in my office.  The patient was reexamined.  There have been no changes to this history and physical examination.  Pt with worsening wound to left lateral 5th mtpj.  Had previously discussed need for more aggressive debridement and the 5th mtpj ulcer appears infected with necrotic tissue.  Discussed need for removal of 5th mtpj and possibly toe and pt agrees.    Reginald Nielsen A

## 2020-03-27 NOTE — Transfer of Care (Signed)
Immediate Anesthesia Transfer of Care Note  Patient: Reginald Nielsen  Procedure(s) Performed: AMPUTATION  LEFT 1ST RAY AND DEBRIDEMENT, EXCISION OF 5TH METATARSAL HEAD (Left )  Patient Location: PACU  Anesthesia Type:General  Level of Consciousness: awake and patient cooperative  Airway & Oxygen Therapy: Patient Spontanous Breathing and Patient connected to face mask  Post-op Assessment: Report given to RN and Post -op Vital signs reviewed and stable  Post vital signs: Reviewed and stable  Last Vitals:  Vitals Value Taken Time  BP 130/74 03/27/20 0921  Temp    Pulse 67 03/27/20 0924  Resp 11 03/27/20 0924  SpO2 100 % 03/27/20 0924  Vitals shown include unvalidated device data.  Last Pain:  Vitals:   03/27/20 0747  TempSrc: Temporal  PainSc: 0-No pain         Complications: No complications documented.

## 2020-03-27 NOTE — OR Nursing (Signed)
Per Dr. Ether Griffins, secure-chat, patient may resume aspirin and plavix today.  Added to d/c instructions/med section.

## 2020-03-27 NOTE — Anesthesia Preprocedure Evaluation (Signed)
Anesthesia Evaluation  Patient identified by MRN, date of birth, ID band Patient awake    Reviewed: Allergy & Precautions, NPO status , Patient's Chart, lab work & pertinent test results  History of Anesthesia Complications Negative for: history of anesthetic complications  Airway Mallampati: II  TM Distance: >3 FB Neck ROM: Full    Dental  (+) Chipped,    Pulmonary neg pulmonary ROS,    breath sounds clear to auscultation       Cardiovascular hypertension, (-) angina+ Peripheral Vascular Disease  (-) DOE  Rhythm:Regular Rate:Normal   HLD   Neuro/Psych negative psych ROS   GI/Hepatic negative GI ROS, Neg liver ROS, neg GERD  ,  Endo/Other  diabetes  Renal/GU negative Renal ROS  negative genitourinary   Musculoskeletal  (+) Arthritis , Osteoarthritis,    Abdominal   Peds negative pediatric ROS (+)  Hematology negative hematology ROS (+)   Anesthesia Other Findings   Past Medical History: No date: Diabetes mellitus without complication (HCC)     Comment:  type 2 No date: Glaucoma No date: Hyperlipidemia No date: Hypertension No date: Shoulder pain, left  Reproductive/Obstetrics                             Anesthesia Physical  Anesthesia Plan  ASA: III  Anesthesia Plan: General   Post-op Pain Management:    Induction: Intravenous  PONV Risk Score and Plan: 2 and Ondansetron, Dexamethasone, Midazolam and Promethazine  Airway Management Planned: LMA  Additional Equipment:   Intra-op Plan:   Post-operative Plan: Extubation in OR  Informed Consent: I have reviewed the patients History and Physical, chart, labs and discussed the procedure including the risks, benefits and alternatives for the proposed anesthesia with the patient or authorized representative who has indicated his/her understanding and acceptance.     Dental Advisory Given  Plan Discussed with: CRNA and  Anesthesiologist  Anesthesia Plan Comments: (Patient consented for risks of anesthesia including but not limited to:  - adverse reactions to medications - damage to eyes, teeth, lips or other oral mucosa - nerve damage due to positioning  - sore throat or hoarseness - Damage to heart, brain, nerves, lungs, other parts of body or loss of life  Patient voiced understanding.)        Anesthesia Quick Evaluation

## 2020-03-27 NOTE — Op Note (Signed)
Operative note   Surgeon:Ginevra Tacker Armed forces logistics/support/administrative officer: None    Preop diagnosis:  1. Full-thickness ulcer left great toe joint 2.  Full-thickness ulcer left fifth toe joint    Postop diagnosis: Same    Procedure: 1.  First ray amputation left foot 2.  Excision fifth metatarsal head left foot    EBL: Minimal    Anesthesia:local and general    Hemostasis: None    Specimen: First ray for pathology, fifth metatarsal for pathology, deep wound culture left great toe joint    Complications: None    Operative indications:Reginald Nielsen is an 60 y.o. that presents today for surgical intervention.  The risks/benefits/alternatives/complications have been discussed and consent has been given.    Procedure:  Patient was brought into the OR and placed on the operating table in thesupine position. After anesthesia was obtained theleft lower extremity was prepped and draped in usual sterile fashion.  Attention was directed to the left foot where a large full-thickness ulcer with exposed joint was noted to the plantar medial aspect.  A longitudinal incision was begun along the medial aspect of the first metatarsal encompassing the large ulceration plantarly and ending with a fishmouth incision around the great toe.  Full-thickness flaps were created dorsal and plantar.  Osteotomy was created about the midshaft level of the first metatarsal and the first ray was then removed from the surgical field in toto.  The wound was flushed with copious amounts of irrigation.  Mild to moderate bleeding was noted to the deep wound site.  A deep wound culture was performed at the amputation site.  The skin flap was well perfused.  Bleeders were Bovie cauterized as needed.  Attention was then directed laterally along the fifth metatarsal head where a full-thickness ulcer was noted with necrotic tissue overlying the fifth metatarsal head.  Full-thickness incision was made with the ulceration encompassed within the  incision site.  Flaps were created dorsal and plantar against the fifth metatarsal head.  The fifth metatarsal was then removed at the level of the distal one third.  This was sent for pathological examination.  Bleeders were Bovie cauterized as appropriate as mild bleeding to moderate bleeding was noted in the area with good perfusion of the skin.  Wounds were flushed with copious amounts of irrigation using a combination of a bulb syringe as well as a pulse lavage.  Closure was performed with a 2-0 Vicryl and 2-0 nylon.  A large bulky sterile dressing was applied.    Patient tolerated the procedure and anesthesia well.  Was transported from the OR to the PACU with all vital signs stable and vascular status intact. To be discharged per routine protocol.  Will follow up in approximately 1 week in the outpatient clinic.

## 2020-03-27 NOTE — Anesthesia Procedure Notes (Signed)
Procedure Name: LMA Insertion Date/Time: 03/27/2020 8:15 AM Performed by: Allena Katz, Onna Nodal, CRNA Pre-anesthesia Checklist: Patient identified, Patient being monitored, Timeout performed, Emergency Drugs available and Suction available Patient Re-evaluated:Patient Re-evaluated prior to induction Oxygen Delivery Method: Circle system utilized Preoxygenation: Pre-oxygenation with 100% oxygen Induction Type: IV induction Ventilation: Mask ventilation without difficulty LMA: LMA inserted LMA Size: 4.5 Tube type: Oral Number of attempts: 1 Placement Confirmation: positive ETCO2 and breath sounds checked- equal and bilateral Tube secured with: Tape Dental Injury: Injury to lip  Comments: Eyes taped prior to LMA placement. Small cut to bottom left lip.

## 2020-03-27 NOTE — Telephone Encounter (Unsigned)
Copied from CRM [id number]. Topic: General - Other >> Mar 27, 2020 10:50 AM Pawlus, Maxine Glenn A wrote: Reason for CRM: Pt wanted a call back regarding his Gabapentin, wanted to speak with Dr Yetta Barre or his nurse. Pt stated he could not pick up the Gaba perscription until the 23rd, wanted to know if there was a way to pick it up earlier so he does not run out. Please advise.

## 2020-03-27 NOTE — Discharge Instructions (Addendum)
[facility]  POST OPERATIVE INSTRUCTIONS FOR DR. TROXLER, DR. Ether Griffins, AND [facility]   1. Take your medication as prescribed.  Pain medication should be taken only as needed.  2. Keep the dressing clean, dry and intact.  Ok to change bandage if breakthrough bleeding occurs.  3. Keep your foot elevated above the heart level for the first 48 hours.  4. We have instructed you to be non-weight bearing.  5. Always wear your post-op shoe when walking.  Always use your crutches if you are to be non-weight bearing.  6. Do not take a shower. Baths are permissible as long as the foot is kept out of the water.   7. Every hour you are awake:  - Bend your knee 15 times. - Flex foot 15 times - Massage calf 15 times  8. Call [facility] [phone]) if any of the following problems occur: - You develop a temperature or fever. - The bandage becomes saturated with blood. - Medication does not stop your pain. - Injury of the foot occurs. - Any symptoms of infection including redness, odor, or red streaks running from wound.   AMBULATORY SURGERY  DISCHARGE INSTRUCTIONS   1) The drugs that you were given will stay in your system until tomorrow so for the next 24 hours you should not:  A) Drive an automobile B) Make any legal decisions C) Drink any alcoholic beverage   2) You may resume regular meals tomorrow.  Today it is better to start with liquids and gradually work up to solid foods.  You may eat anything you prefer, but it is better to start with liquids, then soup and crackers, and gradually work up to solid foods.   3) Please notify your doctor immediately if you have any unusual bleeding, trouble breathing, redness and pain at the surgery site, drainage, fever, or pain not relieved by medication.    4) Additional Instructions:        Please contact your physician with any problems or  Same Day Surgery at [phone], Monday through Friday 6 am to 4 pm, or [place] [facility] number at [phone].

## 2020-03-27 NOTE — Anesthesia Postprocedure Evaluation (Signed)
Anesthesia Post Note  Patient: Reginald Nielsen  Procedure(s) Performed: AMPUTATION  LEFT 1ST RAY AND DEBRIDEMENT, EXCISION OF 5TH METATARSAL HEAD (Left )  Patient location during evaluation: PACU Anesthesia Type: General Level of consciousness: awake and alert Pain management: pain level controlled Vital Signs Assessment: post-procedure vital signs reviewed and stable Respiratory status: spontaneous breathing, nonlabored ventilation, respiratory function stable and patient connected to nasal cannula oxygen Cardiovascular status: blood pressure returned to baseline and stable Postop Assessment: no apparent nausea or vomiting Anesthetic complications: no   No complications documented.   Last Vitals:  Vitals:   03/27/20 0951 03/27/20 1006  BP: 118/69 128/69  Pulse: 66 64  Resp: 15 16  Temp: (!) 36.2 C (!) 36.1 C  SpO2: 100% 100%    Last Pain:  Vitals:   03/27/20 1006  TempSrc: Temporal  PainSc: 0-No pain                 Cleda Mccreedy Piscitello

## 2020-03-27 NOTE — Telephone Encounter (Signed)
Please call and tell him his med was sent in on 3/17/ yesterday

## 2020-03-31 LAB — SURGICAL PATHOLOGY

## 2020-04-01 DIAGNOSIS — L97523 Non-pressure chronic ulcer of other part of left foot with necrosis of muscle: Secondary | ICD-10-CM | POA: Diagnosis not present

## 2020-04-01 LAB — AEROBIC/ANAEROBIC CULTURE W GRAM STAIN (SURGICAL/DEEP WOUND)
Culture: NORMAL
Gram Stain: NONE SEEN

## 2020-04-20 ENCOUNTER — Other Ambulatory Visit: Payer: Self-pay | Admitting: Podiatry

## 2020-04-21 ENCOUNTER — Other Ambulatory Visit (INDEPENDENT_AMBULATORY_CARE_PROVIDER_SITE_OTHER): Payer: Self-pay | Admitting: Vascular Surgery

## 2020-04-27 ENCOUNTER — Other Ambulatory Visit: Admission: RE | Admit: 2020-04-27 | Payer: PPO | Source: Ambulatory Visit

## 2020-04-27 ENCOUNTER — Other Ambulatory Visit: Payer: Self-pay | Admitting: Podiatry

## 2020-04-28 ENCOUNTER — Other Ambulatory Visit: Payer: Self-pay

## 2020-04-28 ENCOUNTER — Encounter
Admission: RE | Admit: 2020-04-28 | Discharge: 2020-04-28 | Disposition: A | Discharge status: Home / Self Care | Payer: PPO | Source: Ambulatory Visit | Attending: Podiatry | Admitting: Podiatry

## 2020-04-28 NOTE — Patient Instructions (Addendum)
Your procedure is scheduled on: Friday, April 22 Report to the Registration Desk on the 1st floor of the [facility] To find out your arrival time, please call [phone] between 1PM - 3PM on: Thursday, April 21  REMEMBER: Instructions that are not followed completely may result in serious medical risk, up to and including death; or upon the discretion of your surgeon and anesthesiologist your surgery may need to be rescheduled.  Do not eat food after midnight the night before surgery.  No gum chewing, lozengers or hard candies.  You may however, drink water up to 2 hours before you are scheduled to arrive for your surgery. Do not drink anything within 2 hours of your scheduled arrival time.  TAKE THESE MEDICATIONS THE MORNING OF SURGERY WITH A SIP OF WATER:  1.  Gabapentin     Stop Metformin 2 days prior to surgery.  Last day to take is April 19; resume AFTER surgery.  Follow recommendations from Cardiologist, Pulmonologist or PCP regarding stopping Plavix. Stop now, April 19  One week prior to surgery: starting today, April 19 Stop aspirin and Anti-inflammatories (NSAIDS) such as Advil, Aleve, Ibuprofen, Motrin, Naproxen, Naprosyn and Aspirin based products such as Excedrin, Goodys Powder, BC Powder. Stop ANY OVER THE COUNTER supplements until after surgery.  No Alcohol for 24 hours before or after surgery.  No Smoking including e-cigarettes for 24 hours prior to surgery.  No chewable tobacco products for at least 6 hours prior to surgery.  No nicotine patches on the day of surgery.  Do not use any "recreational" drugs for at least a week prior to your surgery.  Please be advised that the combination of cocaine and anesthesia may have negative outcomes, up to and including death. If you test positive for cocaine, your surgery will be cancelled.  On the morning of surgery brush your teeth with toothpaste and water, you may rinse your mouth with mouthwash if you wish. Do not  swallow any toothpaste or mouthwash.  Do not wear jewelry.  Do not wear lotions, powders, or perfumes.   Do not shave body from the neck down 48 hours prior to surgery just in case you cut yourself which could leave a site for infection.   Contact lenses, hearing aids and dentures may not be worn into surgery.  Do not bring valuables to the hospital. [facility] is not responsible for any missing/lost belongings or valuables.   Shower using antibacterial soap prior to surgery.  Notify your doctor if there is any change in your medical condition (cold, fever, infection).  Wear comfortable clothing (specific to your surgery type) to the hospital.  Plan for stool softeners for home use; pain medications have a tendency to cause constipation. You can also help prevent constipation by eating foods high in fiber such as fruits and vegetables and drinking plenty of fluids as your diet allows.  After surgery, you can help prevent lung complications by doing breathing exercises.  Take deep breaths and cough every 1-2 hours. Your doctor may order a device called an Incentive Spirometer to help you take deep breaths.  If you are being discharged the day of surgery, you will not be allowed to drive home. You will need a responsible adult (18 years or older) to drive you home and stay with you that night.   If you are taking public transportation, you will need to have a responsible adult (18 years or older) with you. Please confirm with your physician that it  is acceptable to use public transportation.   Please call the [facility] at [phone] if you have any questions about these instructions.  Surgery Visitation Policy:  Patients undergoing a surgery or procedure may have one family member or support person with them as long as that person is not COVID-19 positive or experiencing its symptoms.  That person may remain in the waiting area during the procedure.

## 2020-04-29 ENCOUNTER — Other Ambulatory Visit: Payer: PPO

## 2020-04-30 ENCOUNTER — Other Ambulatory Visit: Payer: Self-pay | Admitting: Family Medicine

## 2020-04-30 DIAGNOSIS — G629 Polyneuropathy, unspecified: Secondary | ICD-10-CM

## 2020-04-30 NOTE — Telephone Encounter (Signed)
Requested medication (s) are due for refill today:   Yes  Requested medication (s) are on the active medication list:   Yes  Future visit scheduled:   Yes   Last ordered: 03/26/2020 #60, 0 refills  Returned because pharmacy requesting an alternative.     Requested Prescriptions  Pending Prescriptions Disp Refills   gabapentin (NEURONTIN) 100 MG capsule 60 capsule 0    Sig: Take 1 capsule (100 mg total) by mouth 2 (two) times daily.      Neurology: Anticonvulsants - gabapentin Passed - 04/30/2020  2:10 PM      Passed - Valid encounter within last 12 months    Recent Outpatient Visits           1 month ago Preop examination   [facility] Duanne Limerick, MD   3 months ago Type 2 diabetes mellitus with diabetic polyneuropathy, without long-term current use of insulin (HCC)   [facility] Duanne Limerick, MD   5 months ago Hospital discharge follow-up   [facility] Duanne Limerick, MD   6 months ago Diabetic foot infection [facility])   [facility] Duanne Limerick, MD   8 months ago Diabetic foot infection [facility])   [facility] Duanne Limerick, MD       Future Appointments             In 2 weeks Duanne Limerick, MD [facility], [facility]

## 2020-04-30 NOTE — Telephone Encounter (Signed)
Medication: gabapentin (NEURONTIN) 100 MG capsule [[id number]  Has the patient contacted their pharmacy? YES  (Agent: If no, request that the patient contact the pharmacy for the refill.) (Agent: If yes, when and what did the pharmacy advise?)  Preferred Pharmacy (with phone number or street name): [facility] [phone] Dan Humphreys, [place] - [address] [place] [address] Phone: [phone] Fax: [phone] Hours: Not open 24 hours    Agent: Please be advised that RX refills may take up to 3 business days. We ask that you follow-up with your pharmacy.

## 2020-05-01 ENCOUNTER — Ambulatory Visit
Admission: RE | Admit: 2020-05-01 | Discharge: 2020-05-01 | Disposition: A | Discharge status: Home / Self Care | Payer: PPO | Attending: Podiatry | Admitting: Podiatry

## 2020-05-01 ENCOUNTER — Encounter
Admission: RE | Disposition: A | Discharge status: Home / Self Care | Payer: Self-pay | Source: Home / Self Care | Attending: Podiatry

## 2020-05-01 ENCOUNTER — Ambulatory Visit: Payer: PPO | Admitting: Anesthesiology

## 2020-05-01 ENCOUNTER — Other Ambulatory Visit: Payer: Self-pay | Admitting: Family Medicine

## 2020-05-01 ENCOUNTER — Encounter: Payer: Self-pay | Admitting: Podiatry

## 2020-05-01 DIAGNOSIS — E1140 Type 2 diabetes mellitus with diabetic neuropathy, unspecified: Secondary | ICD-10-CM | POA: Insufficient documentation

## 2020-05-01 DIAGNOSIS — T8189XA Other complications of procedures, not elsewhere classified, initial encounter: Secondary | ICD-10-CM | POA: Diagnosis not present

## 2020-05-01 DIAGNOSIS — E1151 Type 2 diabetes mellitus with diabetic peripheral angiopathy without gangrene: Secondary | ICD-10-CM | POA: Diagnosis not present

## 2020-05-01 DIAGNOSIS — G629 Polyneuropathy, unspecified: Secondary | ICD-10-CM

## 2020-05-01 DIAGNOSIS — Z7982 Long term (current) use of aspirin: Secondary | ICD-10-CM | POA: Diagnosis not present

## 2020-05-01 DIAGNOSIS — Z7902 Long term (current) use of antithrombotics/antiplatelets: Secondary | ICD-10-CM | POA: Insufficient documentation

## 2020-05-01 DIAGNOSIS — L97523 Non-pressure chronic ulcer of other part of left foot with necrosis of muscle: Secondary | ICD-10-CM | POA: Insufficient documentation

## 2020-05-01 DIAGNOSIS — I10 Essential (primary) hypertension: Secondary | ICD-10-CM | POA: Diagnosis not present

## 2020-05-01 DIAGNOSIS — Z7984 Long term (current) use of oral hypoglycemic drugs: Secondary | ICD-10-CM | POA: Insufficient documentation

## 2020-05-01 DIAGNOSIS — E11621 Type 2 diabetes mellitus with foot ulcer: Secondary | ICD-10-CM | POA: Insufficient documentation

## 2020-05-01 DIAGNOSIS — Z79899 Other long term (current) drug therapy: Secondary | ICD-10-CM | POA: Diagnosis not present

## 2020-05-01 DIAGNOSIS — E785 Hyperlipidemia, unspecified: Secondary | ICD-10-CM | POA: Diagnosis not present

## 2020-05-01 HISTORY — PX: INCISION AND DRAINAGE OF WOUND: SHX1803

## 2020-05-01 HISTORY — PX: IRRIGATION AND DEBRIDEMENT FOOT: SHX6602

## 2020-05-01 LAB — GLUCOSE, CAPILLARY: Glucose-Capillary: 152 mg/dL — ABNORMAL HIGH (ref 70–99)

## 2020-05-01 SURGERY — IRRIGATION AND DEBRIDEMENT WOUND
Anesthesia: General | Laterality: Left

## 2020-05-01 MED ORDER — POVIDONE-IODINE 7.5 % EX SOLN
Freq: Once | CUTANEOUS | Status: DC
  Filled 2020-05-01: qty 118

## 2020-05-01 MED ORDER — MIDAZOLAM HCL 2 MG/2ML IJ SOLN
INTRAMUSCULAR | Status: AC
  Filled 2020-05-01: qty 2

## 2020-05-01 MED ORDER — LACTATED RINGERS IV SOLN: INTRAVENOUS | Status: DC | PRN

## 2020-05-01 MED ORDER — SODIUM CHLORIDE 0.9 % IV SOLN: INTRAVENOUS | Status: DC

## 2020-05-01 MED ORDER — PROPOFOL 1000 MG/100ML IV EMUL
INTRAVENOUS | Status: AC
  Filled 2020-05-01: qty 100

## 2020-05-01 MED ORDER — CEFAZOLIN SODIUM-DEXTROSE 2-4 GM/100ML-% IV SOLN
INTRAVENOUS | Status: AC
  Filled 2020-05-01: qty 100

## 2020-05-01 MED ORDER — PROPOFOL 10 MG/ML IV BOLUS
INTRAVENOUS | Status: DC | PRN
  Administered 2020-05-01 (×2): 30 mg via INTRAVENOUS
  Administered 2020-05-01: 10 mg via INTRAVENOUS
  Administered 2020-05-01: 30 mg via INTRAVENOUS

## 2020-05-01 MED ORDER — DEXMEDETOMIDINE (PRECEDEX) IN NS 20 MCG/5ML (4 MCG/ML) IV SYRINGE
PREFILLED_SYRINGE | INTRAVENOUS | Status: AC
  Filled 2020-05-01: qty 5

## 2020-05-01 MED ORDER — FENTANYL CITRATE (PF) 100 MCG/2ML IJ SOLN
INTRAMUSCULAR | Status: AC
  Filled 2020-05-01: qty 2

## 2020-05-01 MED ORDER — CHLORHEXIDINE GLUCONATE 0.12 % MT SOLN: 15.0000 mL | Freq: Once | OROMUCOSAL | Status: AC

## 2020-05-01 MED ORDER — CHLORHEXIDINE GLUCONATE 0.12 % MT SOLN
OROMUCOSAL | Status: AC
  Administered 2020-05-01: 15 mL via OROMUCOSAL
  Filled 2020-05-01: qty 15

## 2020-05-01 MED ORDER — METHYLENE BLUE 0.5 % INJ SOLN
INTRAVENOUS | Status: AC
  Filled 2020-05-01: qty 10

## 2020-05-01 MED ORDER — FAMOTIDINE 20 MG PO TABS
ORAL_TABLET | ORAL | Status: AC
  Administered 2020-05-01: 20 mg via ORAL
  Filled 2020-05-01: qty 1

## 2020-05-01 MED ORDER — CEFAZOLIN SODIUM-DEXTROSE 2-4 GM/100ML-% IV SOLN
2.0000 g | INTRAVENOUS | Status: AC
  Administered 2020-05-01: 2 g via INTRAVENOUS

## 2020-05-01 MED ORDER — PROPOFOL 500 MG/50ML IV EMUL
INTRAVENOUS | Status: DC | PRN
  Administered 2020-05-01: 100 ug/kg/min via INTRAVENOUS

## 2020-05-01 MED ORDER — OXYCODONE-ACETAMINOPHEN 5-325 MG PO TABS: 1.0000 | ORAL_TABLET | Freq: Four times a day (QID) | ORAL | 0 refills | Status: DC | PRN

## 2020-05-01 MED ORDER — MIDAZOLAM HCL 2 MG/2ML IJ SOLN
INTRAMUSCULAR | Status: DC | PRN
  Administered 2020-05-01: 2 mg via INTRAVENOUS

## 2020-05-01 MED ORDER — DEXMEDETOMIDINE (PRECEDEX) IN NS 20 MCG/5ML (4 MCG/ML) IV SYRINGE
PREFILLED_SYRINGE | INTRAVENOUS | Status: DC | PRN
  Administered 2020-05-01: 12 ug via INTRAVENOUS

## 2020-05-01 MED ORDER — PROPOFOL 10 MG/ML IV BOLUS
INTRAVENOUS | Status: AC
  Filled 2020-05-01: qty 40

## 2020-05-01 MED ORDER — LIDOCAINE HCL (CARDIAC) PF 100 MG/5ML IV SOSY
PREFILLED_SYRINGE | INTRAVENOUS | Status: DC | PRN
  Administered 2020-05-01: 100 mg via INTRAVENOUS

## 2020-05-01 MED ORDER — BUPIVACAINE-EPINEPHRINE (PF) 0.25% -1:200000 IJ SOLN
INTRAMUSCULAR | Status: AC
  Filled 2020-05-01: qty 30

## 2020-05-01 MED ORDER — BUPIVACAINE HCL (PF) 0.25 % IJ SOLN
INTRAMUSCULAR | Status: AC
  Filled 2020-05-01: qty 30

## 2020-05-01 MED ORDER — BUPIVACAINE-EPINEPHRINE (PF) 0.25% -1:200000 IJ SOLN
INTRAMUSCULAR | Status: DC | PRN
Start: 2020-05-01 — End: 2020-05-01
  Administered 2020-05-01: 10 mL

## 2020-05-01 MED ORDER — ORAL CARE MOUTH RINSE: 15.0000 mL | Freq: Once | OROMUCOSAL | Status: AC

## 2020-05-01 MED ORDER — FAMOTIDINE 20 MG PO TABS: 20.0000 mg | ORAL_TABLET | Freq: Once | ORAL | Status: AC

## 2020-05-01 SURGICAL SUPPLY — 64 items
BLADE OSC/SAGITTAL MD 5.5X18 (BLADE) IMPLANT
BLADE OSCILLATING/SAGITTAL (BLADE)
BLADE SURG 15 STRL LF DISP TIS (BLADE) ×1 IMPLANT
BLADE SURG 15 STRL SS (BLADE) ×2
BLADE SW THK.38XMED LNG THN (BLADE) IMPLANT
BNDG COHESIVE 4X5 TAN STRL (GAUZE/BANDAGES/DRESSINGS) ×2 IMPLANT
BNDG COHESIVE 6X5 TAN STRL LF (GAUZE/BANDAGES/DRESSINGS) ×1 IMPLANT
BNDG CONFORM 2 STRL LF (GAUZE/BANDAGES/DRESSINGS) ×1 IMPLANT
BNDG CONFORM 3 STRL LF (GAUZE/BANDAGES/DRESSINGS) ×2 IMPLANT
BNDG ELASTIC 4X5.8 VLCR STR LF (GAUZE/BANDAGES/DRESSINGS) ×2 IMPLANT
BNDG ESMARK 4X12 TAN STRL LF (GAUZE/BANDAGES/DRESSINGS) ×1 IMPLANT
BNDG GAUZE 4.5X4.1 6PLY STRL (MISCELLANEOUS) ×2 IMPLANT
CANISTER SUCT 1200ML W/VALVE (MISCELLANEOUS) ×1 IMPLANT
CANISTER WOUND CARE 500ML ATS (WOUND CARE) ×1 IMPLANT
COVER WAND RF STERILE (DRAPES) ×1 IMPLANT
CUFF TOURN SGL QUICK 12 (TOURNIQUET CUFF) IMPLANT
CUFF TOURN SGL QUICK 18X4 (TOURNIQUET CUFF) IMPLANT
DRAPE FLUOR MINI C-ARM 54X84 (DRAPES) IMPLANT
DRAPE XRAY CASSETTE 23X24 (DRAPES) IMPLANT
DRSG MEPILEX FLEX 3X3 (GAUZE/BANDAGES/DRESSINGS) IMPLANT
DURAPREP 26ML APPLICATOR (WOUND CARE) ×2 IMPLANT
ELECT REM PT RETURN 9FT ADLT (ELECTROSURGICAL) ×2
ELECTRODE REM PT RTRN 9FT ADLT (ELECTROSURGICAL) ×1 IMPLANT
GAUZE PACKING 1/4 X5 YD (GAUZE/BANDAGES/DRESSINGS) ×1 IMPLANT
GAUZE PACKING IODOFORM 1X5 (PACKING) ×1 IMPLANT
GAUZE SPONGE 4X4 12PLY STRL (GAUZE/BANDAGES/DRESSINGS) ×1 IMPLANT
GAUZE XEROFORM 1X8 LF (GAUZE/BANDAGES/DRESSINGS) ×2 IMPLANT
GLOVE SURG ENC MOIS LTX SZ7.5 (GLOVE) ×2 IMPLANT
GLOVE SURG UNDER LTX SZ8 (GLOVE) ×2 IMPLANT
GOWN STRL REUS W/ TWL XL LVL3 (GOWN DISPOSABLE) ×2 IMPLANT
GOWN STRL REUS W/TWL MED LVL3 (GOWN DISPOSABLE) ×2 IMPLANT
GOWN STRL REUS W/TWL XL LVL3 (GOWN DISPOSABLE) ×4
HANDPIECE VERSAJET DEBRIDEMENT (MISCELLANEOUS) ×1 IMPLANT
IV NS 1000ML (IV SOLUTION) ×2
IV NS 1000ML BAXH (IV SOLUTION) ×1 IMPLANT
IV NS IRRIG 3000ML ARTHROMATIC (IV SOLUTION) ×2 IMPLANT
KIT DRSG VAC SLVR GRANUFM (MISCELLANEOUS) ×1 IMPLANT
KIT TURNOVER KIT A (KITS) ×2 IMPLANT
LABEL OR SOLS (LABEL) ×1 IMPLANT
MANIFOLD NEPTUNE II (INSTRUMENTS) ×2 IMPLANT
NDL FILTER BLUNT 18X1 1/2 (NEEDLE) ×1 IMPLANT
NDL HYPO 25X1 1.5 SAFETY (NEEDLE) ×1 IMPLANT
NEEDLE FILTER BLUNT 18X 1/2SAF (NEEDLE) ×1
NEEDLE FILTER BLUNT 18X1 1/2 (NEEDLE) ×1 IMPLANT
NEEDLE HYPO 25X1 1.5 SAFETY (NEEDLE) ×2 IMPLANT
NS IRRIG 500ML POUR BTL (IV SOLUTION) ×2 IMPLANT
PACK EXTREMITY ARMC (MISCELLANEOUS) ×2 IMPLANT
PAD ABD DERMACEA PRESS 5X9 (GAUZE/BANDAGES/DRESSINGS) ×2 IMPLANT
PENCIL ELECTRO HAND CTR (MISCELLANEOUS) ×2 IMPLANT
PULSAVAC PLUS IRRIG FAN TIP (DISPOSABLE) ×2
RASP SM TEAR CROSS CUT (RASP) IMPLANT
SHIELD FULL FACE ANTIFOG 7M (MISCELLANEOUS) ×2 IMPLANT
STOCKINETTE IMPERVIOUS 9X36 MD (GAUZE/BANDAGES/DRESSINGS) ×2 IMPLANT
SUT ETHILON 2 0 FS 18 (SUTURE) ×5 IMPLANT
SUT ETHILON 4-0 (SUTURE)
SUT ETHILON 4-0 FS2 18XMFL BLK (SUTURE)
SUT VIC AB 3-0 SH 27 (SUTURE)
SUT VIC AB 3-0 SH 27X BRD (SUTURE) ×1 IMPLANT
SUT VIC AB 4-0 FS2 27 (SUTURE) ×1 IMPLANT
SUTURE ETHLN 4-0 FS2 18XMF BLK (SUTURE) ×1 IMPLANT
SWAB CULTURE AMIES ANAERIB BLU (MISCELLANEOUS) IMPLANT
SYR 10ML LL (SYRINGE) ×2 IMPLANT
SYR 3ML LL SCALE MARK (SYRINGE) ×2 IMPLANT
TIP FAN IRRIG PULSAVAC PLUS (DISPOSABLE) ×1 IMPLANT

## 2020-05-01 NOTE — Telephone Encounter (Signed)
Requested medication (s) are due for refill today:   Yes  Requested medication (s) are on the active medication list:   Yes  Future visit scheduled:   Yes   Last ordered: 03/26/2020 #60, 0 refills  Returned because pharmacy requesting an alternative.   There is also a note that Dr. Ether Griffins with podiatry fills this so wasn't sure what to do with it.   It keeps being resent by pharmacy.     Requested Prescriptions  Pending Prescriptions Disp Refills   gabapentin (NEURONTIN) 100 MG capsule [Pharmacy Med Name: GABAPENTIN 100 MG CAPSULE] 60 capsule 0    Sig: TAKE 1 CAPSULE BY MOUTH TWICE A DAY      Neurology: Anticonvulsants - gabapentin Passed - 05/01/2020  2:48 PM      Passed - Valid encounter within last 12 months    Recent Outpatient Visits           1 month ago Preop examination   [facility] Duanne Limerick, MD   3 months ago Type 2 diabetes mellitus with diabetic polyneuropathy, without long-term current use of insulin (HCC)   [facility] Duanne Limerick, MD   5 months ago Hospital discharge follow-up   [facility] Duanne Limerick, MD   6 months ago Diabetic foot infection [facility])   [facility] Duanne Limerick, MD   8 months ago Diabetic foot infection [facility])   [facility] Duanne Limerick, MD       Future Appointments             In 2 weeks Duanne Limerick, MD [facility], [facility]

## 2020-05-01 NOTE — H&P (Signed)
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  The various methods of treatment have been discussed with the patient.  No guarantees were given.  After consideration of risks, benefits and other options for treatment, the patient has consented to surgery.  I have reviewed the patients' chart and labs.     A history and physical examination was performed in my office.  The patient was reexamined.  There have been no changes to this history and physical examination.  [name]

## 2020-05-01 NOTE — Anesthesia Preprocedure Evaluation (Signed)
Anesthesia Evaluation  Patient identified by MRN, date of birth, ID band Patient awake    Reviewed: Allergy & Precautions, NPO status , Patient's Chart, lab work & pertinent test results  History of Anesthesia Complications Negative for: history of anesthetic complications  Airway Mallampati: I  TM Distance: >3 FB Neck ROM: Full    Dental  (+) Poor Dentition   Pulmonary neg pulmonary ROS, neg sleep apnea, neg COPD,    breath sounds clear to auscultation- rhonchi (-) wheezing      Cardiovascular hypertension, Pt. on medications + Peripheral Vascular Disease  (-) CAD, (-) Past MI, (-) Cardiac Stents and (-) CABG  Rhythm:Regular Rate:Normal - Systolic murmurs and - Diastolic murmurs    Neuro/Psych neg Seizures negative neurological ROS  negative psych ROS   GI/Hepatic negative GI ROS, Neg liver ROS,   Endo/Other  diabetes, Oral Hypoglycemic Agents  Renal/GU negative Renal ROS     Musculoskeletal  (+) Arthritis ,   Abdominal (+) - obese,   Peds  Hematology negative hematology ROS (+)   Anesthesia Other Findings Past Medical History: No date: Complication of anesthesia     Comment:  Patient stated woke up during last procedure for               angiograph in 2021 No date: Diabetes mellitus without complication (HCC)     Comment:  type 2 No date: Glaucoma No date: Hyperlipidemia No date: Hypertension No date: Shoulder pain, left   Reproductive/Obstetrics                             Anesthesia Physical Anesthesia Plan  ASA: III  Anesthesia Plan: General   Post-op Pain Management:    Induction: Intravenous  PONV Risk Score and Plan: 1 and Ondansetron  Airway Management Planned: LMA  Additional Equipment:   Intra-op Plan:   Post-operative Plan:   Informed Consent: I have reviewed the patients History and Physical, chart, labs and discussed the procedure including the risks,  benefits and alternatives for the proposed anesthesia with the patient or authorized representative who has indicated his/her understanding and acceptance.     Dental advisory given  Plan Discussed with: CRNA and Anesthesiologist  Anesthesia Plan Comments:         Anesthesia Quick Evaluation

## 2020-05-01 NOTE — Discharge Instructions (Signed)
[facility]  POST OPERATIVE INSTRUCTIONS FOR DR. TROXLER, DR. Jenina Moening, AND [facility]   1. Take your medication as prescribed.  Pain medication should be taken only as needed.  2. Keep the dressing clean, dry and intact.  3. Keep your foot elevated above the heart level for the first 48 hours.  4. Walking to the bathroom and brief periods of walking are acceptable, unless we have instructed you to be non-weight bearing.  5. Always wear your post-op shoe when walking.  Always use your crutches if you are to be non-weight bearing.  6. Do not take a shower. Baths are permissible as long as the foot is kept out of the water.   7. Every hour you are awake:  - Bend your knee 15 times. - Flex foot 15 times - Massage calf 15 times  8. Call [facility] ([phone]) if any of the following problems occur: - You develop a temperature or fever. - The bandage becomes saturated with blood. - Medication does not stop your pain. - Injury of the foot occurs. - Any symptoms of infection including redness, odor, or red streaks running from wound.  

## 2020-05-01 NOTE — Anesthesia Postprocedure Evaluation (Signed)
Anesthesia Post Note  Patient: Reginald Nielsen  Procedure(s) Performed: DELAYED PRIMARY CLOSURE- LEFT FOOT (Left ) IRRIGATION AND DEBRIDEMENT FOOT (Left )  Patient location during evaluation: PACU Anesthesia Type: General Level of consciousness: awake and alert and oriented Pain management: pain level controlled Vital Signs Assessment: post-procedure vital signs reviewed and stable Respiratory status: spontaneous breathing, nonlabored ventilation and respiratory function stable Cardiovascular status: blood pressure returned to baseline and stable Postop Assessment: no signs of nausea or vomiting Anesthetic complications: no   No complications documented.   Last Vitals:  Vitals:   05/01/20 1345 05/01/20 1409  BP: 130/75 124/80  Pulse: 61 60  Resp: 13 14  Temp:  (!) 36.1 C  SpO2: 99% 100%    Last Pain:  Vitals:   05/01/20 1409  TempSrc: Temporal  PainSc: 0-No pain                 Modesta Sammons

## 2020-05-01 NOTE — Op Note (Signed)
Operative note   Surgeon:Alakai Macbride Armed forces logistics/support/administrative officer: None    Preop diagnosis: 5 cm deep open wound left medial arch and foot    Postop diagnosis: Same    Procedure: Delayed primary closure with excisional debridement of necrosis of muscle left foot    EBL: Minimal    Anesthesia:local and IV sedation    Hemostasis: None    Specimen: None    Complications: None    Operative indications:Reginald Nielsen is an 60 y.o. that presents today for surgical intervention.  The risks/benefits/alternatives/complications have been discussed and consent has been given.    Procedure:  Patient was brought into the OR and placed on the operating table in thesupine position. After anesthesia was obtained theleft lower extremity was prepped and draped in usual sterile fashion.  Attention was directed to the plantar aspect left foot where an open incision was noted.  There was severe fibrotic and nonviable tissue.  The wound itself was 5 cm x 2 cm with a depth of 2 cm.  There was none viable fibrotic tissue down to the level of muscle.  At this time with a versa jet I was able to remove all of the nonviable tissue and debrided all the way down to and including the level of muscle.  No bone was noted.  All nonviable tissue was then excised.  Much healthier tissue was noted.  At this time the closure of the wound was then performed.  Multiple layers with the use of 2-0 Vicryl were used to close the deeper tissues and the skin was reapproximated with a 2-0 nylon.  Good primary closure was noted with no tension to the flaps.  A large bulky dressing was applied.    Patient tolerated the procedure and anesthesia well.  Was transported from the OR to the PACU with all vital signs stable and vascular status intact. To be discharged per routine protocol.  Will follow up in approximately 1 week in the outpatient clinic.

## 2020-05-01 NOTE — Transfer of Care (Signed)
Immediate Anesthesia Transfer of Care Note  Patient: Reginald Nielsen  Procedure(s) Performed: DELAYED PRIMARY CLOSURE- LEFT FOOT (Left ) IRRIGATION AND DEBRIDEMENT FOOT (Left )  Patient Location: PACU  Anesthesia Type:MAC  Level of Consciousness: awake  Airway & Oxygen Therapy: Patient Spontanous Breathing  Post-op Assessment: Report given to RN  Post vital signs: stable  Last Vitals:  Vitals Value Taken Time  BP 106/72 05/01/20 1321  Temp 36.2 C 05/01/20 1321  Pulse 77 05/01/20 1325  Resp 11 05/01/20 1325  SpO2 100 % 05/01/20 1325  Vitals shown include unvalidated device data.  Last Pain:  Vitals:   05/01/20 1008  TempSrc: Oral  PainSc: 3          Complications: No complications documented.

## 2020-05-02 ENCOUNTER — Encounter: Payer: Self-pay | Admitting: Podiatry

## 2020-05-06 ENCOUNTER — Other Ambulatory Visit: Payer: Self-pay | Admitting: Family Medicine

## 2020-05-06 DIAGNOSIS — G629 Polyneuropathy, unspecified: Secondary | ICD-10-CM

## 2020-05-06 NOTE — Telephone Encounter (Signed)
Requested Prescriptions  Pending Prescriptions Disp Refills  . gabapentin (NEURONTIN) 100 MG capsule [Pharmacy Med Name: GABAPENTIN 100MG  CAPSULES] 90 capsule 0    Sig: TAKE 1 CAPSULE(100 MG) BY MOUTH AT BEDTIME     Neurology: Anticonvulsants - gabapentin Passed - 05/06/2020  5:09 PM      Passed - Valid encounter within last 12 months    Recent Outpatient Visits          1 month ago Preop examination   [facility] 05/08/2020, MD   3 months ago Type 2 diabetes mellitus with diabetic polyneuropathy, without long-term current use of insulin (HCC)   [facility] Duanne Limerick, MD   5 months ago Hospital discharge follow-up   [facility], MD   6 months ago Diabetic foot infection [facility])   [facility], MD   8 months ago Diabetic foot infection [facility])   [facility], MD      Future Appointments            In 2 weeks Duanne Limerick, MD [facility]

## 2020-05-19 ENCOUNTER — Other Ambulatory Visit: Payer: Self-pay | Admitting: Family Medicine

## 2020-05-19 DIAGNOSIS — E1142 Type 2 diabetes mellitus with diabetic polyneuropathy: Secondary | ICD-10-CM

## 2020-05-19 MED ORDER — GLIPIZIDE 5 MG PO TABS: 2.5000 mg | ORAL_TABLET | Freq: Every day | ORAL | 1 refills | Status: AC

## 2020-05-19 MED ORDER — METFORMIN HCL 500 MG PO TABS: 500.0000 mg | ORAL_TABLET | Freq: Two times a day (BID) | ORAL | 1 refills | Status: DC

## 2020-05-19 NOTE — Telephone Encounter (Signed)
Copied from CRM [id number]. Topic: Quick Communication - Rx Refill/Question >> May 19, 2020  2:17 PM Izora Ribas, Everette A wrote: Medication: glipiZIDE (GLUCOTROL) 5 MG tablet  metFORMIN (GLUCOPHAGE) 500 MG tablet   Has the patient contacted their pharmacy? No. This will be patient's first time using this pharmacy  Preferred Pharmacy (with phone number or street name): [facility] [phone] Dan Humphreys, [place] - [address]  Phone:  [phone] Fax:  [phone]  Agent: Please be advised that RX refills may take up to 3 business days. We ask that you follow-up with your pharmacy.

## 2020-05-20 ENCOUNTER — Ambulatory Visit: Payer: PPO | Admitting: Family Medicine

## 2020-05-20 DIAGNOSIS — H401132 Primary open-angle glaucoma, bilateral, moderate stage: Secondary | ICD-10-CM | POA: Diagnosis not present

## 2020-05-20 LAB — HM DIABETES EYE EXAM

## 2020-05-25 ENCOUNTER — Telehealth: Payer: Self-pay | Admitting: Family Medicine

## 2020-05-25 NOTE — Telephone Encounter (Signed)
Copied from CRM [id number]. Topic: Medicare AWV >> May 25, 2020 11:40 AM Claudette Laws R wrote: Reason for CRM:   Left message for patient to call back and schedule Medicare Annual Wellness Visit (AWV) in office.   If unable to come into the office for AWV,  please offer to do virtually or by telephone.  Last AWV:  05/22/2019  Please schedule at anytime with [facility] Health Advisor.  40 minute appointment  Any questions, please contact me at [phone]

## 2020-06-04 DIAGNOSIS — H401130 Primary open-angle glaucoma, bilateral, stage unspecified: Secondary | ICD-10-CM | POA: Diagnosis not present

## 2020-06-22 DIAGNOSIS — M79671 Pain in right foot: Secondary | ICD-10-CM | POA: Diagnosis not present

## 2020-06-22 DIAGNOSIS — E1140 Type 2 diabetes mellitus with diabetic neuropathy, unspecified: Secondary | ICD-10-CM | POA: Diagnosis not present

## 2020-06-22 DIAGNOSIS — I739 Peripheral vascular disease, unspecified: Secondary | ICD-10-CM | POA: Diagnosis not present

## 2020-08-03 ENCOUNTER — Other Ambulatory Visit: Payer: Self-pay | Admitting: Family Medicine

## 2020-08-03 DIAGNOSIS — G629 Polyneuropathy, unspecified: Secondary | ICD-10-CM

## 2020-08-03 NOTE — Telephone Encounter (Signed)
   Notes to clinic:  Review directions for refill Looks like script may have been increased on last appt    Requested Prescriptions  Pending Prescriptions Disp Refills   gabapentin (NEURONTIN) 100 MG capsule [Pharmacy Med Name: GABAPENTIN 100MG  CAPSULES] 90 capsule 0    Sig: TAKE 1 CAPSULE(100 MG) BY MOUTH AT BEDTIME      Neurology: Anticonvulsants - gabapentin Passed - 08/03/2020 10:12 AM      Passed - Valid encounter within last 12 months    Recent Outpatient Visits           4 months ago Preop examination   [facility] 08/05/2020, MD   6 months ago Type 2 diabetes mellitus with diabetic polyneuropathy, without long-term current use of insulin (HCC)   [facility] Duanne Limerick, MD   8 months ago Hospital discharge follow-up   [facility], MD   9 months ago Diabetic foot infection [facility])   [facility], MD   11 months ago Diabetic foot infection [facility])   [facility], MD

## 2020-08-18 ENCOUNTER — Ambulatory Visit (INDEPENDENT_AMBULATORY_CARE_PROVIDER_SITE_OTHER): Payer: PPO | Admitting: Family Medicine

## 2020-08-18 ENCOUNTER — Encounter: Payer: Self-pay | Admitting: Family Medicine

## 2020-08-18 ENCOUNTER — Other Ambulatory Visit: Payer: Self-pay

## 2020-08-18 VITALS — BP 128/92 | HR 90 | Ht 77.0 in | Wt 218.0 lb

## 2020-08-18 DIAGNOSIS — E1142 Type 2 diabetes mellitus with diabetic polyneuropathy: Secondary | ICD-10-CM | POA: Diagnosis not present

## 2020-08-18 DIAGNOSIS — F5101 Primary insomnia: Secondary | ICD-10-CM | POA: Diagnosis not present

## 2020-08-18 DIAGNOSIS — I10 Essential (primary) hypertension: Secondary | ICD-10-CM

## 2020-08-18 DIAGNOSIS — E782 Mixed hyperlipidemia: Secondary | ICD-10-CM | POA: Diagnosis not present

## 2020-08-18 DIAGNOSIS — G629 Polyneuropathy, unspecified: Secondary | ICD-10-CM

## 2020-08-18 DIAGNOSIS — E11628 Type 2 diabetes mellitus with other skin complications: Secondary | ICD-10-CM | POA: Diagnosis not present

## 2020-08-18 DIAGNOSIS — L089 Local infection of the skin and subcutaneous tissue, unspecified: Secondary | ICD-10-CM

## 2020-08-18 MED ORDER — METFORMIN HCL 500 MG PO TABS: 500.0000 mg | ORAL_TABLET | Freq: Two times a day (BID) | ORAL | 1 refills | Status: AC

## 2020-08-18 MED ORDER — ATORVASTATIN CALCIUM 10 MG PO TABS
10.0000 mg | ORAL_TABLET | Freq: Every day | ORAL | 6 refills | Status: DC
Start: 2020-08-18 — End: 2021-01-23

## 2020-08-18 MED ORDER — GABAPENTIN 100 MG PO CAPS: 100.0000 mg | ORAL_CAPSULE | Freq: Two times a day (BID) | ORAL | 1 refills | Status: AC

## 2020-08-18 MED ORDER — AMOXICILLIN-POT CLAVULANATE 875-125 MG PO TABS: 1.0000 | ORAL_TABLET | Freq: Two times a day (BID) | ORAL | 0 refills | Status: AC

## 2020-08-18 MED ORDER — TRAZODONE HCL 50 MG PO TABS: 25.0000 mg | ORAL_TABLET | Freq: Every evening | ORAL | 1 refills | Status: DC | PRN

## 2020-08-18 MED ORDER — LISINOPRIL 5 MG PO TABS
5.0000 mg | ORAL_TABLET | Freq: Every day | ORAL | 3 refills | Status: AC
Start: 2020-08-18 — End: ?

## 2020-08-18 NOTE — Progress Notes (Signed)
Date:  08/18/2020   Name:  Reginald Nielsen   DOB:  [birth year]   MRN:  [id number]   Chief Complaint: Diabetes, Hyperlipidemia, Insomnia, and Peripheral Neuropathy  Diabetes He presents for his follow-up diabetic visit. He has type 2 diabetes mellitus. The initial diagnosis of diabetes was made 10 years ago. His disease course has been stable. Pertinent negatives for hypoglycemia include no confusion, dizziness, headaches, hunger, mood changes, nervousness/anxiousness, sleepiness, speech difficulty, sweats or tremors. Associated symptoms include foot ulcerations. Pertinent negatives for diabetes include no blurred vision, no chest pain, no fatigue, no foot paresthesias, no polydipsia, no polyphagia, no polyuria, no visual change, no weakness and no weight loss. (Going to podiatrist) Symptoms are stable. Diabetic complications include peripheral neuropathy. Pertinent negatives for diabetic complications include no autonomic neuropathy, CVA, heart disease, impotence, nephropathy, PVD or retinopathy. Risk factors for coronary artery disease include dyslipidemia and hypertension. Current diabetic treatment includes oral agent (dual therapy). He is compliant with treatment most of the time. He is following a generally unhealthy diet. Meal planning includes avoidance of concentrated sweets. He participates in exercise intermittently (tries to exercise, depends on foot pain). (Not checking it) An ACE inhibitor/angiotensin II receptor blocker is being taken. He sees a podiatrist.Eye exam is current.  Hyperlipidemia This is a chronic problem. The current episode started more than 1 year ago. Exacerbating diseases include diabetes. Pertinent negatives include no chest pain, focal weakness, leg pain, myalgias or shortness of breath. Current antihyperlipidemic treatment includes statins. There are no compliance problems.   Insomnia   Lab Results  Component Value Date   CREATININE 0.94 03/24/2020   BUN 17  03/24/2020   NA 143 03/12/2020   K 4.8 03/12/2020   CL 103 03/12/2020   CO2 23 03/12/2020   Lab Results  Component Value Date   CHOL 141 03/12/2020   HDL 44 03/12/2020   LDLCALC 81 03/12/2020   TRIG 81 03/12/2020   No results found for: TSH Lab Results  Component Value Date   HGBA1C 6.6 (H) 03/12/2020   Lab Results  Component Value Date   WBC 7.0 03/12/2020   HGB 15.9 03/12/2020   HCT 49.3 03/12/2020   MCV 84 03/12/2020   PLT 226 03/12/2020   Lab Results  Component Value Date   ALT 14 11/01/2019   AST 13 (L) 11/01/2019   ALKPHOS 40 11/01/2019   BILITOT 0.9 11/01/2019     Review of Systems  Constitutional:  Negative for fatigue and weight loss.  Eyes:  Negative for blurred vision.  Respiratory:  Negative for shortness of breath.   Cardiovascular:  Negative for chest pain.  Endocrine: Negative for polydipsia, polyphagia and polyuria.  Genitourinary:  Negative for impotence.  Musculoskeletal:  Negative for myalgias.  Neurological:  Negative for dizziness, tremors, focal weakness, speech difficulty, weakness and headaches.  Psychiatric/Behavioral:  Negative for confusion. The patient has insomnia. The patient is not nervous/anxious.    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/26/2019   Diabetes ([place]) 08/26/2019  DJD (degenerative joint disease) 08/26/2019   Special screening for malignant neoplasms, colon     No Known Allergies  Past Surgical History:  Procedure Laterality Date    AMPUTATION Left 03/27/2020   Procedure: AMPUTATION  LEFT 1ST RAY AND DEBRIDEMENT, EXCISION OF 5TH METATARSAL HEAD;  Surgeon: Samara Deist, DPM;  Location: [facility];  Service: Podiatry;  Laterality: Left;   COLONOSCOPY WITH PROPOFOL N/A 04/08/2019   Procedure: COLONOSCOPY WITH PROPOFOL;  Surgeon: Lucilla Lame, MD;  Location: [place];  Service: Endoscopy;  Laterality: N/A;   EYE SURGERY Bilateral 2011   laser surgery and cataracts    HERNIA REPAIR  5400   umbilical hernia    INCISION AND DRAINAGE OF WOUND Left 05/01/2020   Procedure: DELAYED PRIMARY CLOSURE- LEFT FOOT;  Surgeon: Samara Deist, DPM;  Location: [facility];  Service: Podiatry;  Laterality: Left;   IRRIGATION AND DEBRIDEMENT FOOT Left 11/02/2019   Procedure: IRRIGATION AND DEBRIDEMENT FOOT;  Surgeon: Sharlotte Alamo, DPM;  Location: [facility];  Service: Podiatry;  Laterality: Left;   IRRIGATION AND DEBRIDEMENT FOOT Left 11/06/2019   Procedure: IRRIGATION AND DEBRIDEMENT FOOT;  Surgeon: Sharlotte Alamo, DPM;  Location: [facility];  Service: Podiatry;  Laterality: Left;   IRRIGATION AND DEBRIDEMENT FOOT Left 05/01/2020   Procedure: IRRIGATION AND DEBRIDEMENT FOOT;  Surgeon: Samara Deist, DPM;  Location: [facility];  Service: Podiatry;  Laterality: Left;   LOWER EXTREMITY ANGIOGRAPHY Left 09/03/2019   Procedure: LOWER EXTREMITY ANGIOGRAPHY;  Surgeon: Katha Cabal, MD;  Location: [place] [facility];  Service: Cardiovascular;  Laterality: Left;   LOWER EXTREMITY ANGIOGRAPHY Left 11/05/2019   Procedure: Lower Extremity Angiography;  Surgeon: Katha Cabal, MD;  Location: [place] [facility];  Service: Cardiovascular;  Laterality: Left;   LOWER EXTREMITY ANGIOGRAPHY Left 03/24/2020   Procedure: LOWER EXTREMITY ANGIOGRAPHY;  Surgeon: Katha Cabal, MD;  Location: [place] [facility];  Service: Cardiovascular;  Laterality: Left;   SHOULDER SURGERY Right 2015 and 2016   rotator cuff repair    Social History   Tobacco Use    Smoking status: Never   Smokeless tobacco: Current    Types: Chew  Vaping Use   Vaping Use: Never used  Substance Use Topics   Alcohol use: Not Currently    Comment: quit 11/2017   Drug use: Not Currently    Types: Marijuana    Comment: 20 years ago     Medication list has been reviewed and updated.  Current Meds  Medication Sig   Ascorbic Acid (VITAMIN C) 1000 MG tablet Take 1,000 mg by mouth daily.   aspirin EC 81 MG tablet Take 81 mg by mouth daily.   atorvastatin (LIPITOR) 10 MG tablet Take 1 tablet (10 mg total) by mouth daily. Additional refills per the patient's primary care   Blood Glucose Monitoring Suppl (ONE TOUCH ULTRA 2) w/Device KIT    Brimonidine Tartrate (LUMIFY) 0.025 % SOLN Place 1 drop into both eyes daily.   dorzolamide-timolol (COSOPT) 22.3-6.8 MG/ML ophthalmic solution Place 1 drop into both eyes 2 (two) times daily.   gabapentin (NEURONTIN) 100 MG capsule TAKE 1 CAPSULE(100 MG) BY MOUTH AT BEDTIME   glipiZIDE (GLUCOTROL) 5 MG tablet Take 0.5 tablets (2.5 mg total) by mouth daily before breakfast.   Lancets (ONETOUCH DELICA PLUS [id number]) MISC USE DAILY   latanoprost (XALATAN) 0.005 % ophthalmic solution Place 1 drop into both eyes at bedtime.    metFORMIN (GLUCOPHAGE) 500 MG tablet Take 1 tablet (500 mg total)  by mouth 2 (two) times daily with a meal.   neomycin-bacitracin-polymyxin (NEOSPORIN) ointment Apply 1 application topically as needed for wound care.   ONETOUCH ULTRA test strip USE TO TEST BLOOD SUGAR ONCE DAILY   traZODone (DESYREL) 50 MG tablet Take 0.5-1 tablets (25-50 mg total) by mouth at bedtime as needed for sleep.   [DISCONTINUED] b complex vitamins capsule Take 1 capsule by mouth daily.    PHQ 2/9 Scores 08/18/2020 11/01/2019 08/16/2019 05/22/2019  PHQ - 2 Score 0 0 0 1  PHQ- 9 Score 0 0 3 6    GAD 7 : Generalized Anxiety Score 08/18/2020 11/01/2019 08/16/2019 02/13/2019  Nervous, Anxious, on Edge 0 0 1 0  Control/stop worrying 0 0 1 0   Worry too much - different things 0 0 1 0  Trouble relaxing 0 0 0 0  Restless 0 0 0 0  Easily annoyed or irritable 0 0 0 0  Afraid - awful might happen 0 0 1 0  Total GAD 7 Score 0 0 4 0  Anxiety Difficulty - - Not difficult at all -    BP Readings from Last 3 Encounters:  08/18/20 (!) 128/92  05/01/20 124/80  03/27/20 128/69    Physical Exam  Wt Readings from Last 3 Encounters:  08/18/20 218 lb (98.9 kg)  05/01/20 220 lb (99.8 kg)  04/28/20 220 lb (99.8 kg)    BP (!) 128/92   Pulse 90   Ht '6\' 5"'  (1.956 m)   Wt 218 lb (98.9 kg)   BMI 25.85 kg/m   Assessment and Plan:  1. Neuropathy Chronic.  Uncontrolled.  Patient is having more discomfort at this time.  We will increase gabapentin to 100 mg twice a day. - gabapentin (NEURONTIN) 100 MG capsule; Take 1 capsule (100 mg total) by mouth 2 (two) times daily.  Dispense: 180 capsule; Refill: 1  2. Type 2 diabetes mellitus with diabetic polyneuropathy, without long-term current use of insulin (HCC) Chronic.  Controlled.  Stable.  Patient was told to take 2.5 glipizide once a day when A1c increased to greater than 6.  Patient is currently on metformin 500 mg twice a day and pending A1c will either can continue glipizide at 2.5 or increase to 5. - metFORMIN (GLUCOPHAGE) 500 MG tablet; Take 1 tablet (500 mg total) by mouth 2 (two) times daily with a meal.  Dispense: 180 tablet; Refill: 1 - HgB A1c - Comprehensive Metabolic Panel (CMET)  3. Primary insomnia Chronic.  Controlled.  Stable.  Continue trazodone 50 mg 1/2 to 1 tablet at night. - traZODone (DESYREL) 50 MG tablet; Take 0.5-1 tablets (25-50 mg total) by mouth at bedtime as needed for sleep.  Dispense: 90 tablet; Refill: 1  4. Moderate mixed hyperlipidemia not requiring statin therapy Chronic.  Controlled.  Stable.  Continue atorvastatin 10 mg once a day.  Will check lipid panel. - atorvastatin (LIPITOR) 10 MG tablet; Take 1 tablet (10 mg total) by mouth daily.  Additional refills per the patient's primary care  Dispense: 30 tablet; Refill: 6 - Lipid Panel With LDL/HDL Ratio  5. Primary hypertension Chronic.  Uncontrolled.  Relatively stable.  But blood pressure is 128/92 and this was repeated with same results.  We will initiate lisinopril 5 mg once a day.  We will recheck blood pressure in 6 months.  We will check CMP for GFR and electrolytes. - Comprehensive Metabolic Panel (CMET) - lisinopril (ZESTRIL) 5 MG tablet; Take 1 tablet (5 mg total) by mouth daily.  Dispense: 90 tablet; Refill: 3  6. Diabetic foot infection [facility]) Patient stated that his right great toe that his wife has been working on a callus of his big toe and ever some erythema and some tenderness of the area.  I am concerned that he may have an early infection and we will start Augmentin 875 mg twice a day and we have contacted and patient does have a podiatry appointment with Dr. Vickki Muff next week. - amoxicillin-clavulanate (AUGMENTIN) 875-125 MG tablet; Take 1 tablet by mouth 2 (two) times daily.  Dispense: 20 tablet; Refill: 0

## 2020-08-19 LAB — COMPREHENSIVE METABOLIC PANEL
ALT: 12 IU/L (ref 0–44)
AST: 15 IU/L (ref 0–40)
Albumin/Globulin Ratio: 1.7 (ref 1.2–2.2)
Albumin: 4.8 g/dL (ref 3.8–4.9)
Alkaline Phosphatase: 48 IU/L (ref 44–121)
BUN/Creatinine Ratio: 18 (ref 9–20)
BUN: 17 mg/dL (ref 6–24)
Bilirubin Total: 0.7 mg/dL (ref 0.0–1.2)
CO2: 24 mmol/L (ref 20–29)
Calcium: 9.6 mg/dL (ref 8.7–10.2)
Chloride: 106 mmol/L (ref 96–106)
Creatinine, Ser: 0.95 mg/dL (ref 0.76–1.27)
Globulin, Total: 2.9 g/dL (ref 1.5–4.5)
Glucose: 144 mg/dL — ABNORMAL HIGH (ref 65–99)
Potassium: 5.2 mmol/L (ref 3.5–5.2)
Sodium: 143 mmol/L (ref 134–144)
Total Protein: 7.7 g/dL (ref 6.0–8.5)
eGFR: 92 mL/min/{1.73_m2} (ref >59)

## 2020-08-19 LAB — HEMOGLOBIN A1C
Est. average glucose Bld gHb Est-mCnc: 131 mg/dL
Hgb A1c MFr Bld: 6.2 % — ABNORMAL HIGH (ref 4.8–5.6)

## 2020-08-19 LAB — LIPID PANEL WITH LDL/HDL RATIO
Cholesterol, Total: 198 mg/dL (ref 100–199)
HDL: 45 mg/dL (ref >39)
LDL Chol Calc (NIH): 138 mg/dL — ABNORMAL HIGH (ref 0–99)
LDL/HDL Ratio: 3.1 ratio (ref 0.0–3.6)
Triglycerides: 84 mg/dL (ref 0–149)
VLDL Cholesterol Cal: 15 mg/dL (ref 5–40)

## 2020-08-24 ENCOUNTER — Telehealth: Payer: Self-pay | Admitting: Family Medicine

## 2020-08-24 NOTE — Telephone Encounter (Signed)
Copied from CRM [id number]. Topic: Medicare AWV >> Aug 24, 2020 11:39 AM Claudette Laws R wrote: Reason for CRM:  Left message for patient to call back and schedule Medicare Annual Wellness Visit (AWV) in office.   If unable to come into the office for AWV,  please offer to do virtually or by telephone.  Last AWV: 05/22/2019  Please schedule at anytime with [facility] Health Advisor.  40 minute appointment  Any questions, please contact me at [phone]

## 2020-08-26 DIAGNOSIS — L03031 Cellulitis of right toe: Secondary | ICD-10-CM | POA: Diagnosis not present

## 2020-08-26 DIAGNOSIS — I739 Peripheral vascular disease, unspecified: Secondary | ICD-10-CM | POA: Diagnosis not present

## 2020-08-26 DIAGNOSIS — Z89422 Acquired absence of other left toe(s): Secondary | ICD-10-CM | POA: Diagnosis not present

## 2020-08-26 DIAGNOSIS — L97511 Non-pressure chronic ulcer of other part of right foot limited to breakdown of skin: Secondary | ICD-10-CM | POA: Diagnosis not present

## 2020-08-26 DIAGNOSIS — E1140 Type 2 diabetes mellitus with diabetic neuropathy, unspecified: Secondary | ICD-10-CM | POA: Diagnosis not present

## 2020-09-09 DIAGNOSIS — L97511 Non-pressure chronic ulcer of other part of right foot limited to breakdown of skin: Secondary | ICD-10-CM | POA: Diagnosis not present

## 2020-09-09 DIAGNOSIS — I739 Peripheral vascular disease, unspecified: Secondary | ICD-10-CM | POA: Diagnosis not present

## 2020-09-09 DIAGNOSIS — E1140 Type 2 diabetes mellitus with diabetic neuropathy, unspecified: Secondary | ICD-10-CM | POA: Diagnosis not present

## 2020-09-09 DIAGNOSIS — L03031 Cellulitis of right toe: Secondary | ICD-10-CM | POA: Diagnosis not present

## 2020-09-14 ENCOUNTER — Other Ambulatory Visit: Payer: Self-pay | Admitting: Family Medicine

## 2020-09-14 DIAGNOSIS — E1142 Type 2 diabetes mellitus with diabetic polyneuropathy: Secondary | ICD-10-CM

## 2020-10-02 ENCOUNTER — Telehealth: Payer: Self-pay

## 2020-10-02 NOTE — Progress Notes (Signed)
  Left message for patient to call back and schedule Medicare Annual Wellness Visit (AWV) on  Saturday Sept 24,2022 to do by phone or video 8-12  Last AWV 05/21/2020 Please schedule with [facility] Health Advisor.      60 Minutes appointment   Any questions, please call me at [phone]  Penne Lash, RMA Care Guide, Embedded Care Coordination [facility]  [place], [place] [address] Direct Dial: [phone] [email or web address]

## 2020-10-12 DIAGNOSIS — E1140 Type 2 diabetes mellitus with diabetic neuropathy, unspecified: Secondary | ICD-10-CM | POA: Diagnosis not present

## 2020-10-12 DIAGNOSIS — I739 Peripheral vascular disease, unspecified: Secondary | ICD-10-CM | POA: Diagnosis not present

## 2020-10-12 DIAGNOSIS — M79671 Pain in right foot: Secondary | ICD-10-CM | POA: Diagnosis not present

## 2020-10-12 DIAGNOSIS — L97512 Non-pressure chronic ulcer of other part of right foot with fat layer exposed: Secondary | ICD-10-CM | POA: Diagnosis not present

## 2020-10-21 DIAGNOSIS — L97512 Non-pressure chronic ulcer of other part of right foot with fat layer exposed: Secondary | ICD-10-CM | POA: Diagnosis not present

## 2020-10-21 DIAGNOSIS — E1140 Type 2 diabetes mellitus with diabetic neuropathy, unspecified: Secondary | ICD-10-CM | POA: Diagnosis not present

## 2020-10-21 DIAGNOSIS — L02611 Cutaneous abscess of right foot: Secondary | ICD-10-CM | POA: Diagnosis not present

## 2020-10-21 DIAGNOSIS — L03031 Cellulitis of right toe: Secondary | ICD-10-CM | POA: Diagnosis not present

## 2020-10-26 DIAGNOSIS — H401132 Primary open-angle glaucoma, bilateral, moderate stage: Secondary | ICD-10-CM | POA: Diagnosis not present

## 2020-11-02 DIAGNOSIS — L97511 Non-pressure chronic ulcer of other part of right foot limited to breakdown of skin: Secondary | ICD-10-CM | POA: Diagnosis not present

## 2020-11-02 DIAGNOSIS — E1140 Type 2 diabetes mellitus with diabetic neuropathy, unspecified: Secondary | ICD-10-CM | POA: Diagnosis not present

## 2020-11-02 DIAGNOSIS — L97515 Non-pressure chronic ulcer of other part of right foot with muscle involvement without evidence of necrosis: Secondary | ICD-10-CM | POA: Diagnosis not present

## 2020-11-02 DIAGNOSIS — L97512 Non-pressure chronic ulcer of other part of right foot with fat layer exposed: Secondary | ICD-10-CM | POA: Diagnosis not present

## 2020-12-17 ENCOUNTER — Ambulatory Visit: Payer: Self-pay | Admitting: Family Medicine

## 2020-12-17 ENCOUNTER — Telehealth: Payer: Self-pay

## 2020-12-17 NOTE — Telephone Encounter (Signed)
Could not reach patient or wife.

## 2020-12-17 NOTE — Telephone Encounter (Signed)
-----   Message from Everitt Amber sent at 12/17/2020 10:39 AM EST ----- Please call pt/ or wife and tell them he missed his appt- needs to stay on schedule as no show and come in this month or January for med refill/ diabetes

## 2020-12-24 DIAGNOSIS — L97419 Non-pressure chronic ulcer of right heel and midfoot with unspecified severity: Secondary | ICD-10-CM | POA: Diagnosis not present

## 2020-12-24 DIAGNOSIS — E1140 Type 2 diabetes mellitus with diabetic neuropathy, unspecified: Secondary | ICD-10-CM | POA: Diagnosis not present

## 2020-12-24 DIAGNOSIS — E1152 Type 2 diabetes mellitus with diabetic peripheral angiopathy with gangrene: Secondary | ICD-10-CM | POA: Diagnosis not present

## 2020-12-24 DIAGNOSIS — I70234 Atherosclerosis of native arteries of right leg with ulceration of heel and midfoot: Secondary | ICD-10-CM | POA: Diagnosis not present

## 2020-12-24 DIAGNOSIS — T402X5A Adverse effect of other opioids, initial encounter: Secondary | ICD-10-CM | POA: Diagnosis not present

## 2020-12-24 DIAGNOSIS — I70235 Atherosclerosis of native arteries of right leg with ulceration of other part of foot: Secondary | ICD-10-CM | POA: Diagnosis not present

## 2020-12-24 DIAGNOSIS — S91301A Unspecified open wound, right foot, initial encounter: Secondary | ICD-10-CM | POA: Diagnosis not present

## 2020-12-24 DIAGNOSIS — E13621 Other specified diabetes mellitus with foot ulcer: Secondary | ICD-10-CM | POA: Diagnosis not present

## 2020-12-24 DIAGNOSIS — L02611 Cutaneous abscess of right foot: Secondary | ICD-10-CM | POA: Diagnosis not present

## 2020-12-24 DIAGNOSIS — H409 Unspecified glaucoma: Secondary | ICD-10-CM | POA: Diagnosis not present

## 2020-12-24 DIAGNOSIS — H269 Unspecified cataract: Secondary | ICD-10-CM | POA: Diagnosis not present

## 2020-12-24 DIAGNOSIS — R700 Elevated erythrocyte sedimentation rate: Secondary | ICD-10-CM | POA: Diagnosis not present

## 2020-12-24 DIAGNOSIS — K5903 Drug induced constipation: Secondary | ICD-10-CM | POA: Diagnosis not present

## 2020-12-24 DIAGNOSIS — E11621 Type 2 diabetes mellitus with foot ulcer: Secondary | ICD-10-CM | POA: Diagnosis not present

## 2020-12-24 DIAGNOSIS — A480 Gas gangrene: Secondary | ICD-10-CM | POA: Diagnosis not present

## 2020-12-24 DIAGNOSIS — L97519 Non-pressure chronic ulcer of other part of right foot with unspecified severity: Secondary | ICD-10-CM | POA: Diagnosis not present

## 2020-12-24 DIAGNOSIS — L03115 Cellulitis of right lower limb: Secondary | ICD-10-CM | POA: Diagnosis not present

## 2020-12-24 DIAGNOSIS — I10 Essential (primary) hypertension: Secondary | ICD-10-CM | POA: Diagnosis not present

## 2020-12-24 DIAGNOSIS — E11628 Type 2 diabetes mellitus with other skin complications: Secondary | ICD-10-CM | POA: Diagnosis not present

## 2020-12-24 DIAGNOSIS — E785 Hyperlipidemia, unspecified: Secondary | ICD-10-CM | POA: Diagnosis not present

## 2020-12-24 DIAGNOSIS — M79671 Pain in right foot: Secondary | ICD-10-CM | POA: Diagnosis not present

## 2020-12-27 DIAGNOSIS — E13621 Other specified diabetes mellitus with foot ulcer: Secondary | ICD-10-CM | POA: Diagnosis not present

## 2020-12-27 DIAGNOSIS — E11621 Type 2 diabetes mellitus with foot ulcer: Secondary | ICD-10-CM | POA: Diagnosis not present

## 2020-12-27 DIAGNOSIS — L97519 Non-pressure chronic ulcer of other part of right foot with unspecified severity: Secondary | ICD-10-CM | POA: Diagnosis not present

## 2020-12-28 DIAGNOSIS — E11621 Type 2 diabetes mellitus with foot ulcer: Secondary | ICD-10-CM | POA: Diagnosis not present

## 2020-12-28 DIAGNOSIS — L97519 Non-pressure chronic ulcer of other part of right foot with unspecified severity: Secondary | ICD-10-CM | POA: Diagnosis not present

## 2020-12-28 DIAGNOSIS — E13621 Other specified diabetes mellitus with foot ulcer: Secondary | ICD-10-CM | POA: Diagnosis not present

## 2020-12-29 DIAGNOSIS — E11621 Type 2 diabetes mellitus with foot ulcer: Secondary | ICD-10-CM | POA: Diagnosis not present

## 2020-12-29 DIAGNOSIS — E13621 Other specified diabetes mellitus with foot ulcer: Secondary | ICD-10-CM | POA: Diagnosis not present

## 2020-12-29 DIAGNOSIS — L97519 Non-pressure chronic ulcer of other part of right foot with unspecified severity: Secondary | ICD-10-CM | POA: Diagnosis not present

## 2020-12-30 DIAGNOSIS — E13621 Other specified diabetes mellitus with foot ulcer: Secondary | ICD-10-CM | POA: Diagnosis not present

## 2020-12-30 DIAGNOSIS — L97519 Non-pressure chronic ulcer of other part of right foot with unspecified severity: Secondary | ICD-10-CM | POA: Diagnosis not present

## 2020-12-30 DIAGNOSIS — E11621 Type 2 diabetes mellitus with foot ulcer: Secondary | ICD-10-CM | POA: Diagnosis not present

## 2020-12-31 DIAGNOSIS — E13621 Other specified diabetes mellitus with foot ulcer: Secondary | ICD-10-CM | POA: Diagnosis not present

## 2020-12-31 DIAGNOSIS — L97519 Non-pressure chronic ulcer of other part of right foot with unspecified severity: Secondary | ICD-10-CM | POA: Diagnosis not present

## 2020-12-31 DIAGNOSIS — E11621 Type 2 diabetes mellitus with foot ulcer: Secondary | ICD-10-CM | POA: Diagnosis not present

## 2021-01-01 DIAGNOSIS — L03115 Cellulitis of right lower limb: Secondary | ICD-10-CM | POA: Diagnosis not present

## 2021-01-01 DIAGNOSIS — I70234 Atherosclerosis of native arteries of right leg with ulceration of heel and midfoot: Secondary | ICD-10-CM | POA: Diagnosis not present

## 2021-01-01 DIAGNOSIS — E1152 Type 2 diabetes mellitus with diabetic peripheral angiopathy with gangrene: Secondary | ICD-10-CM | POA: Diagnosis not present

## 2021-01-01 DIAGNOSIS — I70235 Atherosclerosis of native arteries of right leg with ulceration of other part of foot: Secondary | ICD-10-CM | POA: Diagnosis not present

## 2021-01-01 DIAGNOSIS — E13621 Other specified diabetes mellitus with foot ulcer: Secondary | ICD-10-CM | POA: Diagnosis not present

## 2021-01-01 DIAGNOSIS — E785 Hyperlipidemia, unspecified: Secondary | ICD-10-CM | POA: Diagnosis not present

## 2021-01-01 DIAGNOSIS — L02611 Cutaneous abscess of right foot: Secondary | ICD-10-CM | POA: Diagnosis not present

## 2021-01-01 DIAGNOSIS — L97519 Non-pressure chronic ulcer of other part of right foot with unspecified severity: Secondary | ICD-10-CM | POA: Diagnosis not present

## 2021-01-01 DIAGNOSIS — E11628 Type 2 diabetes mellitus with other skin complications: Secondary | ICD-10-CM | POA: Diagnosis not present

## 2021-01-01 DIAGNOSIS — L97419 Non-pressure chronic ulcer of right heel and midfoot with unspecified severity: Secondary | ICD-10-CM | POA: Diagnosis not present

## 2021-01-01 DIAGNOSIS — A480 Gas gangrene: Secondary | ICD-10-CM | POA: Diagnosis not present

## 2021-01-01 DIAGNOSIS — E11621 Type 2 diabetes mellitus with foot ulcer: Secondary | ICD-10-CM | POA: Diagnosis not present

## 2021-01-01 DIAGNOSIS — S91301A Unspecified open wound, right foot, initial encounter: Secondary | ICD-10-CM | POA: Diagnosis not present

## 2021-01-01 DIAGNOSIS — I10 Essential (primary) hypertension: Secondary | ICD-10-CM | POA: Diagnosis not present

## 2021-01-05 ENCOUNTER — Telehealth: Payer: Self-pay

## 2021-01-05 NOTE — Telephone Encounter (Signed)
Transition Care Management Unsuccessful Follow-up Telephone Call  Date of discharge and from where:  01/01/21 [place]  Attempts:  1st Attempt  Reason for unsuccessful TCM follow-up call:  Unable to reach patient. Spoke to patient's wife who stated  he was asleep. They have moved to [place] area and he plans to follow up with a provider there.

## 2021-01-06 DIAGNOSIS — M7731 Calcaneal spur, right foot: Secondary | ICD-10-CM | POA: Diagnosis not present

## 2021-01-06 DIAGNOSIS — M2041 Other hammer toe(s) (acquired), right foot: Secondary | ICD-10-CM | POA: Diagnosis not present

## 2021-01-06 DIAGNOSIS — M19071 Primary osteoarthritis, right ankle and foot: Secondary | ICD-10-CM | POA: Diagnosis not present

## 2021-01-06 DIAGNOSIS — E11621 Type 2 diabetes mellitus with foot ulcer: Secondary | ICD-10-CM | POA: Diagnosis not present

## 2021-01-06 DIAGNOSIS — L97512 Non-pressure chronic ulcer of other part of right foot with fat layer exposed: Secondary | ICD-10-CM | POA: Diagnosis not present

## 2021-01-06 DIAGNOSIS — A480 Gas gangrene: Secondary | ICD-10-CM | POA: Diagnosis not present

## 2021-01-23 ENCOUNTER — Other Ambulatory Visit: Payer: Self-pay | Admitting: Family Medicine

## 2021-01-23 DIAGNOSIS — E782 Mixed hyperlipidemia: Secondary | ICD-10-CM

## 2021-01-23 NOTE — Telephone Encounter (Signed)
Requested Prescriptions  Pending Prescriptions Disp Refills   atorvastatin (LIPITOR) 10 MG tablet [Pharmacy Med Name: ATORVASTATIN 10 MG TABLET] 90 tablet 2    Sig: TAKE 1 TABLET (10 MG TOTAL) BY MOUTH DAILY. ADDITIONAL REFILLS PER THE PATIENT'S PRIMARY CARE     Cardiovascular:  Antilipid - Statins Failed - 01/23/2021  9:52 AM      Failed - LDL in normal range and within 360 days    LDL Chol Calc (NIH)  Date Value Ref Range Status  08/18/2020 138 (H) 0 - 99 mg/dL Final         Passed - Total Cholesterol in normal range and within 360 days    Cholesterol, Total  Date Value Ref Range Status  08/18/2020 198 100 - 199 mg/dL Final         Passed - HDL in normal range and within 360 days    HDL  Date Value Ref Range Status  08/18/2020 45 >39 mg/dL Final         Passed - Triglycerides in normal range and within 360 days    Triglycerides  Date Value Ref Range Status  08/18/2020 84 0 - 149 mg/dL Final         Passed - Patient is not pregnant      Passed - Valid encounter within last 12 months    Recent Outpatient Visits          5 months ago Type 2 diabetes mellitus with diabetic polyneuropathy, without long-term current use of insulin (HCC)   [facility] Duanne Limerick, MD   10 months ago Preop examination   [facility] Duanne Limerick, MD   1 year ago Type 2 diabetes mellitus with diabetic polyneuropathy, without long-term current use of insulin (HCC)   [facility] Duanne Limerick, MD   1 year ago Hospital discharge follow-up   [facility] Du[facility] Limerick, MD   1 year ago Diabetic foot infection [facility])   [facility] Duanne Limerick, MD

## 2021-02-01 ENCOUNTER — Telehealth: Payer: Self-pay | Admitting: Family Medicine

## 2021-02-01 NOTE — Telephone Encounter (Signed)
Copied from CRM [id number]. Topic: Medicare AWV >> Feb 01, 2021 10:13 AM Claudette Laws R wrote: Reason for CRM:  Left message for patient to call back and schedule Medicare Annual Wellness Visit (AWV) in office.   If unable to come into the office for AWV,  please offer to do virtually or by telephone.  Last AWV: 05/22/2019  Please schedule at anytime with [facility] Health Advisor.      40 Minutes appointment   Any questions, please call me at [phone]

## 2021-02-21 ENCOUNTER — Other Ambulatory Visit: Payer: Self-pay | Admitting: Family Medicine

## 2021-02-21 DIAGNOSIS — F5101 Primary insomnia: Secondary | ICD-10-CM

## 2021-02-22 NOTE — Telephone Encounter (Signed)
Requested Prescriptions  Pending Prescriptions Disp Refills   traZODone (DESYREL) 50 MG tablet [Pharmacy Med Name: TRAZODONE 50 MG TABLET] 90 tablet 0    Sig: TAKE 0.5-1 TABLETS BY MOUTH AT BEDTIME AS NEEDED FOR SLEEP.     Psychiatry: Antidepressants - Serotonin Modulator Failed - 02/21/2021 11:04 AM      Failed - Valid encounter within last 6 months    Recent Outpatient Visits          6 months ago Type 2 diabetes mellitus with diabetic polyneuropathy, without long-term current use of insulin (HCC)   [facility] Duanne Limerick, MD   11 months ago Preop examination   [facility] Duanne Limerick, MD   1 year ago Type 2 diabetes mellitus with diabetic polyneuropathy, without long-term current use of insulin (HCC)   [facility] Duanne Limerick, MD   1 year ago Hospital discharge follow-up   [facility] Duanne Limerick, MD   1 year ago Diabetic foot infection [facility])   [facility] Duanne Limerick, MD

## 2021-03-31 ENCOUNTER — Telehealth: Payer: Self-pay | Admitting: Family Medicine

## 2021-03-31 NOTE — Telephone Encounter (Signed)
Copied from CRM [id number]. Topic: Medicare AWV ?>> Mar 31, 2021 10:11 AM Claudette Laws R wrote: ?Reason for CRM:  ?Left message for patient to call back and schedule Medicare Annual Wellness Visit (AWV) in office.  ? ?If unable to come into the office for AWV,  please offer to do virtually or by telephone. ? ?Last AWV: 05/22/2019 ? ?Please schedule at anytime with [facility] Health Advisor.     ? ?40 Minutes appointment  ? ?Any questions, please call me at [phone] ?

## 2021-05-25 ENCOUNTER — Telehealth: Payer: Self-pay

## 2021-05-25 NOTE — Telephone Encounter (Signed)
-----   Message from Fredderick Severance sent at 05/24/2021  4:43 PM EDT ----- ?Pt not seen since August of last year- need to see this week for med ? ?

## 2021-05-25 NOTE — Telephone Encounter (Signed)
Left voice mail to set up medication refill appointment for sometime next week. ?

## 2021-11-08 ENCOUNTER — Encounter (INDEPENDENT_AMBULATORY_CARE_PROVIDER_SITE_OTHER): Payer: Self-pay

## 2021-11-30 ENCOUNTER — Telehealth: Payer: Self-pay

## 2021-11-30 NOTE — Telephone Encounter (Signed)
Transition Care Management Unsuccessful Follow-up Telephone Call  Date of discharge and from where:  11/29/21; [facility] [place]  Attempts:  1st Attempt  Reason for unsuccessful TCM follow-up call:  Left voice message

## 2022-06-06 IMAGING — MR MR FOOT*L* W/O CM
5 series · 40 of 40 positions shown · non-contrast
Comparison: Left foot x-ray 11/01/2019

CLINICAL DATA: Diabetic foot wound at the base of the left great
toe and cellulitis

EXAM:
MRI OF THE LEFT FOOT WITHOUT CONTRAST
TECHNIQUE: Multiplanar, multisequence MR imaging of the left forefoot was
performed. No intravenous contrast was administered.

[Series 3: T1 · coronal · left · 3.0mm · 0.38mm/px · 11 of 55 slices shown (1 of 2)]
[im 1/55]
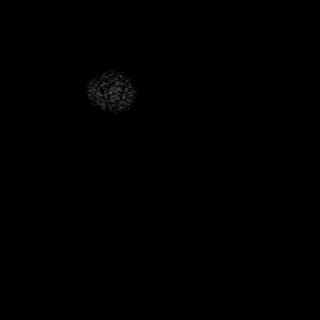
[im 6/55]
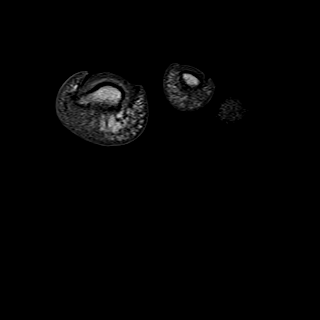
[im 11/55]
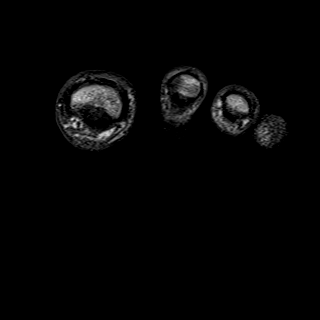
[im 17/55]
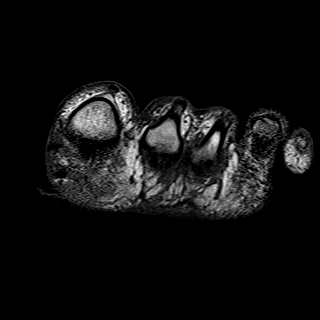
[im 22/55]
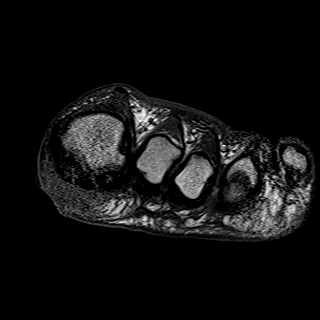
[im 28/55]
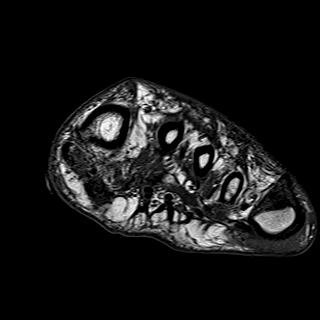
[im 33/55]
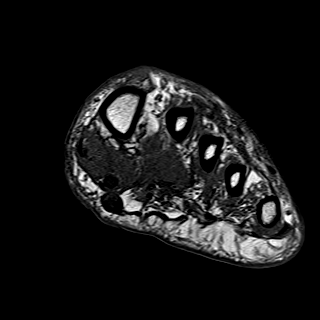
[im 38/55]
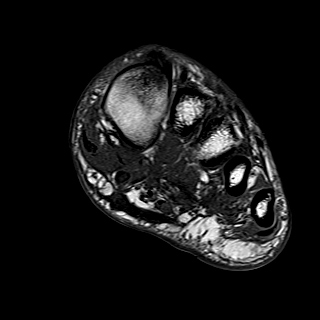
[im 44/55]
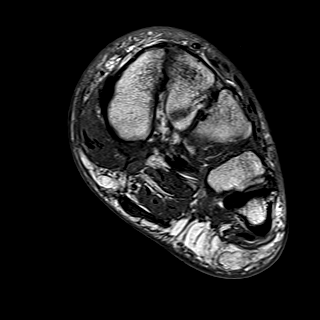
[im 49/55]
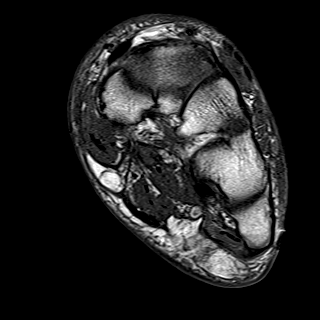
[im 55/55]
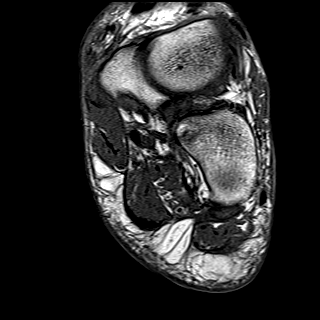

[Series 5: T2 · coronal · left · 3.0mm · 0.50mm/px · 11 of 55 slices shown (1 of 2)]
[im 1/55]
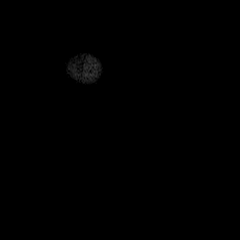
[im 6/55]
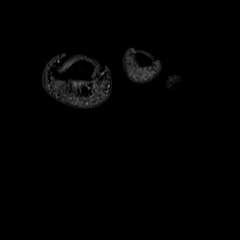
[im 11/55]
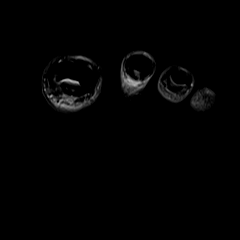
[im 17/55]
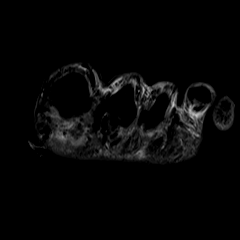
[im 22/55]
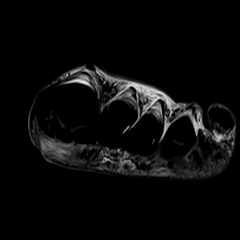
[im 28/55]
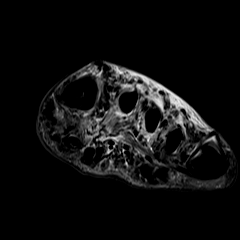
[im 33/55]
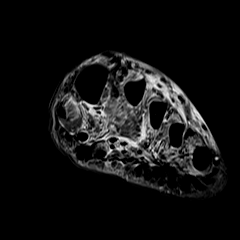
[im 38/55]
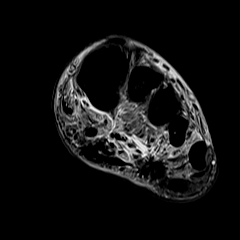
[im 44/55]
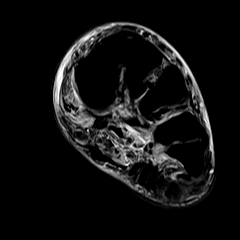
[im 49/55]
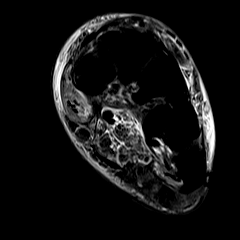
[im 55/55]
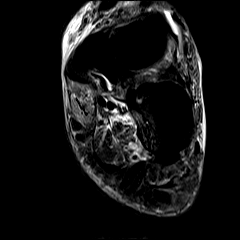

[Series 6: T1 · axial · left · 3.0mm · 0.86mm/px · z∈[-133,-33]mm · 6 of 31 slices shown (2 of 2)]
[im 1/31]
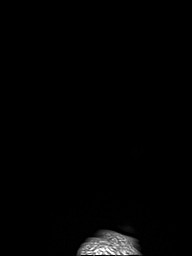
[im 7/31]
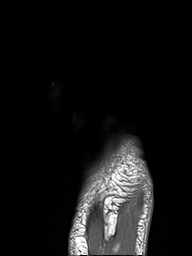
[im 13/31]
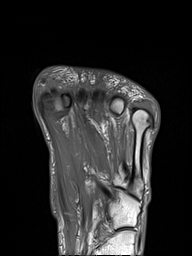
[im 19/31]
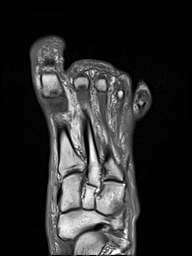
[im 25/31]
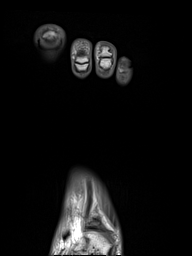
[im 31/31]
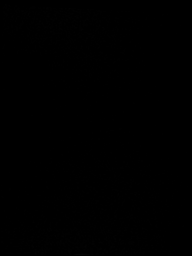

[Series 8: T2 · axial · left · 3.0mm · 0.86mm/px · z∈[-133,-37]mm · 6 of 30 slices shown (2 of 2)]
[im 1/30]
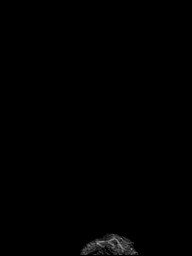
[im 6/30]
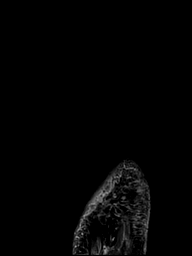
[im 12/30]
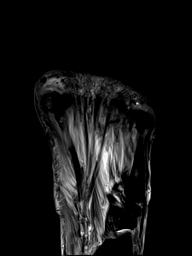
[im 18/30]
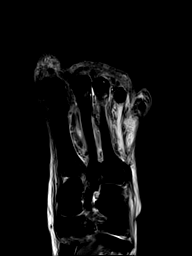
[im 24/30]
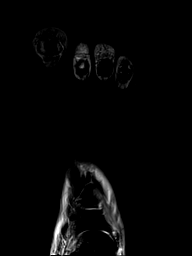
[im 30/30]
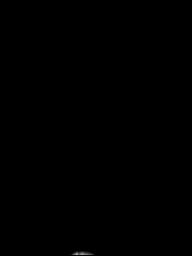

[Series 9: STIR · sagittal · left · 3.0mm · 0.70mm/px · 6 of 31 slices shown]
[im 1/31]
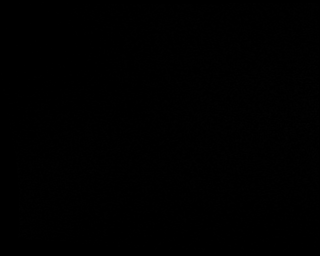
[im 7/31]
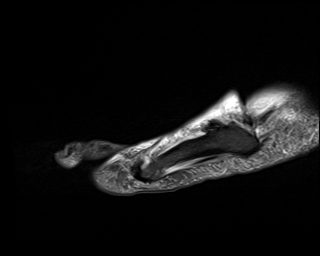
[im 13/31]
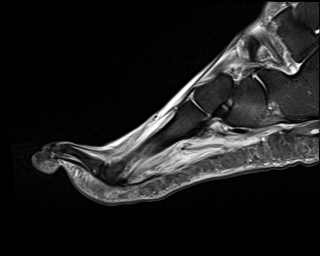
[im 19/31]
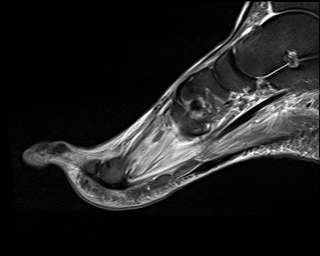
[im 25/31]
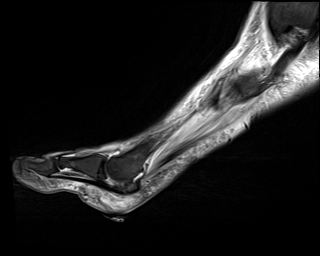
[im 31/31]
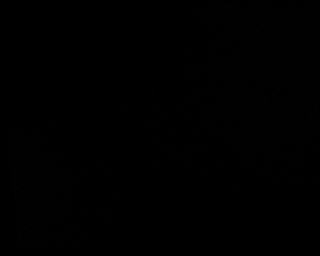

[40 of 40 positions shown; findings below may reference images not displayed]

FINDINGS: Bones/Joint/Cartilage

Bone marrow edema within the medial hallux sesamoid with patchy low
T1 marrow signal suggestive of osteomyelitis (series 3 and 5, images
[id number]). Marrow signal of the lateral hallux sesamoid and first
metatarsal head are within normal limits. The remaining osseous
structures are normal in signal. No acute fracture. No dislocation.
No joint effusion. No significant arthropathy.

Ligaments

Intact Lisfranc ligament. Collateral ligaments of the forefoot are
intact.

Muscles and Tendons

Diffuse edema-like T2 signal throughout the intrinsic musculature
which may represent myositis and/or denervation changes. Intact
flexor and extensor tendons. No tenosynovitis.

Soft tissues

Superficial plantar foot ulceration underlying the first metatarsal
head with associated soft tissue thickening and soft tissue edema.
Multiple foci of susceptibility within the plantar soft tissues
underlying the proximal phalanx of the great toe. No organized fluid
collection. Mild diffuse subcutaneous edema. There is nodular
thickening of the plantar fascia in two locations, one of which is
at the level of the mid first metatarsal diaphysis and additional
fusiform thickening at the level of the first TMT joint (series 5,
images 33 and 40).
IMPRESSION: 1. Bone marrow signal changes within the medial hallux sesamoid
suspicious for osteomyelitis. No additional sites of marrow signal
abnormality within the great toe or remaining forefoot.
2. Plantar foot ulceration underlying the first metatarsal head with
associated soft tissue thickening and soft tissue edema. Multiple
foci of susceptibility within the plantar soft tissues underlying
the proximal phalanx of the great toe which may reflect air tracking
from the adjacent ulceration or possibly from a gas-forming
infection.
3. Diffuse edema-like signal throughout the intrinsic musculature of
the forefoot which may represent myositis and/or denervation
changes.
4. Findings of plantar fibromatosis.
